# Patient Record
Sex: Female | Born: 1940
Health system: Southern US, Community
[De-identification: ages and names within clinical notes are randomized; demographics above are authoritative.]

## PROBLEM LIST (undated history)

## (undated) DIAGNOSIS — C44309 Unspecified malignant neoplasm of skin of other parts of face: Secondary | ICD-10-CM

## (undated) DIAGNOSIS — R52 Pain, unspecified: Secondary | ICD-10-CM

## (undated) DIAGNOSIS — I499 Cardiac arrhythmia, unspecified: Secondary | ICD-10-CM

## (undated) DIAGNOSIS — H9190 Unspecified hearing loss, unspecified ear: Secondary | ICD-10-CM

## (undated) DIAGNOSIS — M1990 Unspecified osteoarthritis, unspecified site: Secondary | ICD-10-CM

## (undated) DIAGNOSIS — E785 Hyperlipidemia, unspecified: Secondary | ICD-10-CM

## (undated) DIAGNOSIS — I10 Essential (primary) hypertension: Secondary | ICD-10-CM

## (undated) HISTORY — DX: Essential (primary) hypertension: I10

## (undated) HISTORY — DX: Unspecified osteoarthritis, unspecified site: M19.90

## (undated) HISTORY — DX: Hyperlipidemia, unspecified: E78.5

---

## 1970-09-30 HISTORY — PX: BREAST EXCISIONAL BIOPSY: SUR124

## 1997-09-30 HISTORY — PX: HYSTEROSCOPY: SHX211

## 2000-01-23 ENCOUNTER — Encounter: Admission: RE | Admit: 2000-01-23 | Discharge: 2000-01-23 | Payer: Self-pay | Admitting: Obstetrics and Gynecology

## 2000-01-23 ENCOUNTER — Encounter: Payer: Self-pay | Admitting: Obstetrics and Gynecology

## 2001-01-23 ENCOUNTER — Encounter: Admission: RE | Admit: 2001-01-23 | Discharge: 2001-01-23 | Payer: Self-pay | Admitting: Obstetrics and Gynecology

## 2001-01-23 ENCOUNTER — Encounter: Payer: Self-pay | Admitting: Obstetrics and Gynecology

## 2002-01-27 ENCOUNTER — Encounter: Admission: RE | Admit: 2002-01-27 | Discharge: 2002-01-27 | Payer: Self-pay | Admitting: Obstetrics and Gynecology

## 2002-01-27 ENCOUNTER — Encounter: Payer: Self-pay | Admitting: Obstetrics and Gynecology

## 2003-02-09 ENCOUNTER — Encounter: Admission: RE | Admit: 2003-02-09 | Discharge: 2003-02-09 | Payer: Self-pay | Admitting: Obstetrics and Gynecology

## 2003-02-09 ENCOUNTER — Encounter: Payer: Self-pay | Admitting: Obstetrics and Gynecology

## 2004-02-17 ENCOUNTER — Encounter: Admission: RE | Admit: 2004-02-17 | Discharge: 2004-02-17 | Payer: Self-pay | Admitting: Obstetrics and Gynecology

## 2005-02-18 ENCOUNTER — Encounter: Admission: RE | Admit: 2005-02-18 | Discharge: 2005-02-18 | Payer: Self-pay | Admitting: Obstetrics and Gynecology

## 2006-02-19 ENCOUNTER — Encounter: Admission: RE | Admit: 2006-02-19 | Discharge: 2006-02-19 | Payer: Self-pay | Admitting: Obstetrics and Gynecology

## 2007-02-24 ENCOUNTER — Encounter: Admission: RE | Admit: 2007-02-24 | Discharge: 2007-02-24 | Payer: Self-pay | Admitting: Anesthesiology

## 2007-11-02 DIAGNOSIS — E7800 Pure hypercholesterolemia, unspecified: Secondary | ICD-10-CM | POA: Insufficient documentation

## 2007-11-02 DIAGNOSIS — I10 Essential (primary) hypertension: Secondary | ICD-10-CM | POA: Insufficient documentation

## 2008-04-21 ENCOUNTER — Ambulatory Visit: Payer: Self-pay

## 2008-09-13 DIAGNOSIS — N959 Unspecified menopausal and perimenopausal disorder: Secondary | ICD-10-CM | POA: Insufficient documentation

## 2008-09-19 ENCOUNTER — Ambulatory Visit: Payer: Self-pay | Admitting: Family Medicine

## 2008-12-12 DIAGNOSIS — E559 Vitamin D deficiency, unspecified: Secondary | ICD-10-CM | POA: Insufficient documentation

## 2009-04-25 ENCOUNTER — Ambulatory Visit: Payer: Self-pay

## 2010-02-09 DIAGNOSIS — IMO0002 Reserved for concepts with insufficient information to code with codable children: Secondary | ICD-10-CM | POA: Insufficient documentation

## 2010-02-09 DIAGNOSIS — I4891 Unspecified atrial fibrillation: Secondary | ICD-10-CM | POA: Insufficient documentation

## 2010-02-27 ENCOUNTER — Ambulatory Visit: Payer: Self-pay | Admitting: Gastroenterology

## 2010-06-07 ENCOUNTER — Ambulatory Visit: Payer: Self-pay

## 2010-09-25 ENCOUNTER — Ambulatory Visit: Payer: Self-pay | Admitting: Family Medicine

## 2011-07-01 ENCOUNTER — Ambulatory Visit: Payer: Self-pay | Admitting: Family Medicine

## 2011-10-01 HISTORY — PX: CHOLECYSTECTOMY: SHX55

## 2012-02-10 ENCOUNTER — Ambulatory Visit: Payer: Self-pay | Admitting: Family Medicine

## 2012-02-10 LAB — BASIC METABOLIC PANEL
BUN: 28 mg/dL — ABNORMAL HIGH (ref 7–18)
Calcium, Total: 8.2 mg/dL — ABNORMAL LOW (ref 8.5–10.1)
EGFR (Non-African Amer.): 42 — ABNORMAL LOW
Glucose: 107 mg/dL — ABNORMAL HIGH (ref 65–99)
Sodium: 137 mmol/L (ref 136–145)

## 2012-02-10 LAB — CBC WITH DIFFERENTIAL/PLATELET
Basophil %: 1.1 %
HCT: 36.6 % (ref 35.0–47.0)
MCH: 31.9 pg (ref 26.0–34.0)
Monocyte %: 8 %
Neutrophil #: 3 10*3/uL (ref 1.4–6.5)
Platelet: 161 10*3/uL (ref 150–440)
RBC: 3.88 10*6/uL (ref 3.80–5.20)
WBC: 7 10*3/uL (ref 3.6–11.0)

## 2012-02-10 LAB — HEPATIC FUNCTION PANEL A (ARMC)
Bilirubin,Total: 0.5 mg/dL (ref 0.2–1.0)
Total Protein: 7.6 g/dL (ref 6.4–8.2)

## 2012-02-11 LAB — CBC WITH DIFFERENTIAL/PLATELET
Basophil: 1 %
HCT: 34.2 % — ABNORMAL LOW (ref 35.0–47.0)
Lymphocytes: 38 %
Monocytes: 9 %
Segmented Neutrophils: 27 %
Variant Lymphocyte - H1-Rlymph: 22 %

## 2012-02-11 LAB — BASIC METABOLIC PANEL
Calcium, Total: 7.9 mg/dL — ABNORMAL LOW (ref 8.5–10.1)
Creatinine: 1 mg/dL (ref 0.60–1.30)
EGFR (African American): >60
EGFR (Non-African Amer.): 56 — ABNORMAL LOW
Osmolality: 284 (ref 275–301)

## 2012-02-12 ENCOUNTER — Inpatient Hospital Stay: Payer: Self-pay | Admitting: Surgery

## 2012-02-12 LAB — BASIC METABOLIC PANEL
Anion Gap: 5 — ABNORMAL LOW (ref 7–16)
BUN: 11 mg/dL (ref 7–18)
Calcium, Total: 7.2 mg/dL — ABNORMAL LOW (ref 8.5–10.1)
Chloride: 112 mmol/L — ABNORMAL HIGH (ref 98–107)
Co2: 26 mmol/L (ref 21–32)
EGFR (African American): >60
Osmolality: 284 (ref 275–301)
Sodium: 143 mmol/L (ref 136–145)

## 2012-02-12 LAB — CBC WITH DIFFERENTIAL/PLATELET
Basophil %: 0.7 %
Eosinophil %: 1.8 %
HCT: 31.9 % — ABNORMAL LOW (ref 35.0–47.0)
MCV: 95 fL (ref 80–100)
Monocyte %: 7 %
Monocytes: 7 %
Neutrophil #: 1.6 10*3/uL (ref 1.4–6.5)
Neutrophil %: 25 %
Platelet: 157 10*3/uL (ref 150–440)
Segmented Neutrophils: 28 %
Variant Lymphocyte - H1-Rlymph: 26 %

## 2012-02-12 LAB — HEPATIC FUNCTION PANEL A (ARMC)
Albumin: 2 g/dL — ABNORMAL LOW (ref 3.4–5.0)
Bilirubin, Direct: <0.1 mg/dL (ref 0.00–0.20)
SGOT(AST): 49 U/L — ABNORMAL HIGH (ref 15–37)
SGPT (ALT): 22 U/L

## 2012-02-19 LAB — PATHOLOGY REPORT

## 2012-07-06 ENCOUNTER — Ambulatory Visit: Payer: Self-pay | Admitting: Family Medicine

## 2012-09-29 ENCOUNTER — Ambulatory Visit: Payer: Self-pay | Admitting: Family Medicine

## 2013-07-12 ENCOUNTER — Ambulatory Visit: Payer: Self-pay | Admitting: Family Medicine

## 2014-07-18 ENCOUNTER — Ambulatory Visit: Payer: Self-pay | Admitting: Family Medicine

## 2014-07-29 ENCOUNTER — Ambulatory Visit: Payer: Self-pay | Admitting: Family Medicine

## 2014-08-02 ENCOUNTER — Encounter: Payer: Self-pay | Admitting: General Surgery

## 2014-08-15 ENCOUNTER — Encounter: Payer: Self-pay | Admitting: General Surgery

## 2014-08-15 ENCOUNTER — Ambulatory Visit (INDEPENDENT_AMBULATORY_CARE_PROVIDER_SITE_OTHER): Payer: Medicare Other | Admitting: General Surgery

## 2014-08-15 VITALS — BP 140/78 | HR 78 | Resp 14 | Ht 63.0 in | Wt 116.0 lb

## 2014-08-15 DIAGNOSIS — R928 Other abnormal and inconclusive findings on diagnostic imaging of breast: Secondary | ICD-10-CM

## 2014-08-15 DIAGNOSIS — R921 Mammographic calcification found on diagnostic imaging of breast: Secondary | ICD-10-CM

## 2014-08-15 NOTE — Patient Instructions (Addendum)
Stereotactic Breast Biopsy A stereotactic breast biopsy is a procedure in which mammography is used in the collection of a sample of breast tissue. Mammography is a type of X-ray exam of the breasts that produces an image called a mammogram. The mammogram allows your health care provider to precisely locate the area of the breast from which a tissue sample will be taken. The tissue is then examined under a microscope to see if cancerous cells are present. A breast biopsy is done when:   A lump, abnormality, or mass is seen in the breast on a breast X-ray (mammogram).   Small calcium deposits (calcifications) are seen in the breast.   The shape or appearance of the breasts changes.   The shape or appearance of the nipples changes. You may have unusual or bloody discharge coming from the nipples, or you may have crusting, retraction, or dimpling of the nipples. A breast biopsy can indicate if you need surgery or other treatment.  LET YOUR HEALTH CARE PROVIDER KNOW ABOUT:  Any allergies you have.  All medicines you are taking, including vitamins, herbs, eye drops, creams, and over-the-counter medicines.  Previous problems you or members of your family have had with the use of anesthetics.  Any blood disorders you have.  Previous surgeries you have had.  Medical conditions you have. RISKS AND COMPLICATIONS Generally, stereotactic breast biopsy is a safe procedure. However, as with any procedure, complications can occur. Possible complications include:  Infection at the needle-insertion site.   Bleeding or bruising after surgery.  The breast may become altered or deformed as a result of the procedure.  The needle may go through the chest wall into the lung area.  BEFORE THE PROCEDURE  Wear a supportive bra to the procedure.  You will be asked to remove jewelry, dentures, eyeglasses, metal objects, or clothing that might interfere with the X-ray images. You may want to leave  some of these objects at home.  Arrange for someone to drive you home after the procedure if desired. PROCEDURE  A stereotactic breast biopsy is done while you are awake. During the procedure, relax as much as possible. Let your health care provider know if you are uncomfortable, anxious, or in pain. Usually, the only discomfort felt during the procedure is caused by staying in one position for the length of the procedure. This discomfort can be reduced by carefully placed cushions. Most of the time the biopsy is done using a table with openings on it. You will be asked to lie facedown on the table and place your breasts through the openings. Your breast is compressed between metal plates to get good X-ray images. Your skin will be cleaned, and a numbing medicine (local anesthetic) will be injected. A small cut (incision) will be made in your breast. The tip of the biopsy needle will be directed through the incision. Several small pieces of suspicious tissue will be taken. Then, a final set of X-ray images will be obtained. If they show that the suspicious tissue has been mostly or completely removed, a small clip will be left at the biopsy site. This is done so that the biopsy site can be easily located if the results of the biopsy show that the tissue is cancerous.  After the procedure, the incision will be stitched (sutured) or taped and covered with a bandage (dressing). Your health care provider may apply a pressure dressing and an ice pack to prevent bleeding and swelling in the breast.  A stereotactic   breast biopsy can take 30 minutes or more. AFTER THE PROCEDURE  If you are doing well and have no problems, you will be allowed to go home.  Document Released: 06/15/2003 Document Revised: 09/21/2013 Document Reviewed: 04/15/2013 [facility] Patient Information 2015 [place], [place]. This information is not intended to replace advice given to you by your health care provider. Make sure you discuss any  questions you have with your health care provider.  Patient has been scheduled for a right breast stereotactic biopsy at [facility] for 08-22-14 at 4 pm. She will check-in at the [facility] at 3:30 pm. This patient is aware of date, time, and instructions. Patient verbalizes understanding.

## 2014-08-15 NOTE — Progress Notes (Signed)
Patient ID: Morgan Ponce, female   DOB: November 25, 1940, 73 y.o.   MRN: [id number]  Chief Complaint  Patient presents with  . Follow-up    mammogram    HPI Morgan Ponce is a 73 y.o. female. Here for follow up of a mammogram done on 07/18/14 with added views on 07/29/14. Calcifications were found in her right breast. This was from her yearly mammogram. She reports no breast problems previous to this. She does monthly breast checks and annual mammograms.  HPI  Past Medical History  Diagnosis Date  . Hypertension   . Hyperlipidemia   . Arthritis     Past Surgical History  Procedure Laterality Date  . Breast biopsy Right 1972  . Hysteroscopy  1999  . Cholecystectomy  2013    No family history on file.  Social History History  Substance Use Topics  . Smoking status: Never Smoker   . Smokeless tobacco: Never Used  . Alcohol Use: No    Allergies  Allergen Reactions  . Cardizem [Diltiazem] Rash    Current Outpatient Prescriptions  Medication Sig Dispense Refill  . alendronate (FOSAMAX) 70 MG tablet Take 70 mg by mouth once a week.     Marland Kitchen aspirin 81 MG tablet Take 81 mg by mouth daily.    . hydrochlorothiazide (HYDRODIURIL) 25 MG tablet Take 25 mg by mouth daily.     Marland Kitchen lovastatin (MEVACOR) 10 MG tablet Take 10 mg by mouth at bedtime.     . Vitamin D, Cholecalciferol, 1000 UNITS TABS Take 1 tablet by mouth daily.     No current facility-administered medications for this visit.    Review of Systems Review of Systems  Constitutional: Negative.   HENT: Negative.   Respiratory: Negative.     Blood pressure 140/78, pulse 78, resp. rate 14, height 5\' 3"  (1.6 m), weight 116 lb (52.617 kg).  Physical Exam Physical Exam  Constitutional: She is oriented to person, place, and time. She appears well-developed and well-nourished.  Neck: Neck supple.  Cardiovascular: Normal rate, regular rhythm and normal heart sounds.   Pulmonary/Chest: Effort normal and breath sounds normal.  Right breast exhibits no inverted nipple, no mass, no nipple discharge, no skin change and no tenderness. Left breast exhibits no inverted nipple, no mass, no nipple discharge, no skin change and no tenderness. Breasts are asymmetrical (right breast larger than left).  Lymphadenopathy:    She has no cervical adenopathy.    She has no axillary adenopathy.  Neurological: She is alert and oriented to person, place, and time.    Data Reviewed PCP notes of 07/19/2014.  Screening mammograms of 07/18/2014 were reviewed and compared to studies dating back to 2013. A small area of microcalcifications are noted in the right breast more prominent than in 2014 and significantly more prominent than 2013 my review.  Focal spot compression views dated 07/29/2014 showed similar calcifications dating back through 2007. BI-RADS-3. (Sees biopsy was offered to the patient by the radiologist) sees.  Assessment    Right breast microcalcifications, low risk for malignancy.     Plan    My index of suspicion for these calcifications being malignant is exceptionally well. The patient is exceptionally anxious. The fact the biopsy was discussed with the radiologist has raised her level of concern beyond what I can counteract.the risks associated with the stereotactic procedure have been reviewed.  Patient has been scheduled for a right breast stereotactic biopsy at [facility] for 08-22-14 at 4 pm. She will check-in at  the [facility] at 3:30 pm. This patient is aware of date, time, and instructions. Patient verbalizes understanding.     PCP/REF: Earnest Bailey, Caryl-Lyn M 08/15/2014, 1:07 PM

## 2014-08-16 DIAGNOSIS — R921 Mammographic calcification found on diagnostic imaging of breast: Secondary | ICD-10-CM | POA: Insufficient documentation

## 2014-08-22 ENCOUNTER — Ambulatory Visit: Payer: Self-pay | Admitting: General Surgery

## 2014-08-22 DIAGNOSIS — R920 Mammographic microcalcification found on diagnostic imaging of breast: Secondary | ICD-10-CM

## 2014-08-22 HISTORY — PX: BREAST BIOPSY: SHX20

## 2014-08-23 ENCOUNTER — Ambulatory Visit: Payer: Self-pay | Admitting: General Surgery

## 2014-08-24 ENCOUNTER — Encounter: Payer: Self-pay | Admitting: General Surgery

## 2014-08-29 ENCOUNTER — Encounter: Payer: Self-pay | Admitting: General Surgery

## 2014-08-29 ENCOUNTER — Ambulatory Visit (INDEPENDENT_AMBULATORY_CARE_PROVIDER_SITE_OTHER): Payer: Self-pay | Admitting: *Deleted

## 2014-08-29 DIAGNOSIS — R921 Mammographic calcification found on diagnostic imaging of breast: Secondary | ICD-10-CM

## 2014-08-29 DIAGNOSIS — R928 Other abnormal and inconclusive findings on diagnostic imaging of breast: Secondary | ICD-10-CM

## 2014-08-29 NOTE — Progress Notes (Signed)
Patient here today for follow up post right breast stereo.Steristrip in place and aware it may come off in one week.  Minimal bruising noted.  The patient is aware that a heating pad may be used for comfort as needed.. Follow up as scheduled.

## 2015-01-16 DIAGNOSIS — M810 Age-related osteoporosis without current pathological fracture: Secondary | ICD-10-CM | POA: Diagnosis not present

## 2015-01-16 DIAGNOSIS — E785 Hyperlipidemia, unspecified: Secondary | ICD-10-CM | POA: Diagnosis not present

## 2015-01-16 DIAGNOSIS — I10 Essential (primary) hypertension: Secondary | ICD-10-CM | POA: Diagnosis not present

## 2015-01-16 DIAGNOSIS — M719 Bursopathy, unspecified: Secondary | ICD-10-CM | POA: Diagnosis not present

## 2015-01-16 DIAGNOSIS — Z23 Encounter for immunization: Secondary | ICD-10-CM | POA: Diagnosis not present

## 2015-01-16 LAB — HEPATIC FUNCTION PANEL
ALK PHOS: 51 U/L (ref 25–125)
ALT: 12 U/L (ref 7–35)
AST: 28 U/L (ref 13–35)
BILIRUBIN, TOTAL: 0.5 mg/dL

## 2015-01-16 LAB — CBC AND DIFFERENTIAL
HEMATOCRIT: 37 % (ref 36–46)
HEMOGLOBIN: 12.2 g/dL (ref 12.0–16.0)
Neutrophils Absolute: 4 /uL
Platelets: 225 10*3/uL (ref 150–399)
WBC: 6.1 10^3/mL

## 2015-01-16 LAB — LIPID PANEL
Cholesterol: 177 mg/dL (ref 0–200)
HDL: 68 mg/dL (ref 35–70)
LDL CALC: 92 mg/dL
LDl/HDL Ratio: 1.4
TRIGLYCERIDES: 87 mg/dL (ref 40–160)

## 2015-01-16 LAB — TSH: TSH: 3.34 u[IU]/mL (ref 0.41–5.90)

## 2015-01-16 LAB — BASIC METABOLIC PANEL
BUN: 18 mg/dL (ref 4–21)
Creatinine: 0.8 mg/dL (ref 0.5–1.1)
GLUCOSE: 91 mg/dL

## 2015-01-22 NOTE — Consult Note (Signed)
Chief Complaint:   Subjective/Chief Complaint Went to see patient: not in room, currently in [place]: Theodore Demark (NP)  (Signed 16-May-13 12:36)  Authored: Chief Complaint   Last Updated: 16-May-13 12:36 by Theodore Demark (NP)

## 2015-01-22 NOTE — Op Note (Signed)
PATIENT NAME:  Morgan Ponce, Morgan Ponce MR#:  [id number] DATE OF BIRTH:  1941/08/09  DATE OF PROCEDURE:  02/13/2012  PREOPERATIVE DIAGNOSIS: Chronic cholecystitis and cholelithiasis.   POSTOPERATIVE DIAGNOSIS: Chronic cholecystitis and cholelithiasis.   OPERATION: Laparoscopic cholecystectomy.   SURGEON: Rodena Goldmann, MD  ANESTHESIA: General.   OPERATIVE PROCEDURE: With the patient in the supine position after induction of appropriate general anesthesia, the patient's abdomen was prepped with ChloraPrep and draped with sterile towels. The patient was placed in the head down, feet up position. A small infraumbilical incision was made in the standard fashion and carried down bluntly through the subcutaneous tissue. The Veress needle was used to cannulate the peritoneal cavity. CO2 was insufflated to appropriate pressure measurements. When approximately 2.5 liters of CO2 were instilled, the Veress needle was withdrawn and an 11 mm [employer] port was inserted into the peritoneal cavity. Intraperitoneal position was confirmed and CO2 was reinsufflated. The patient was placed in the head up, feet down position and rotated slightly to the left side. A subxiphoid transverse incision was made and an 11 mm port was inserted under direct vision. Two lateral ports 5 mm in size were inserted under direct vision. There were multiple adhesions to the gallbladder which appeared chronically inflamed with discoloration and scarring about the gallbladder. The gallbladder was retracted superiorly and laterally exposing the hepatoduodenal ligament. The cystic artery and cystic duct were identified. The cystic duct was clipped on the gallbladder side and opened. An on table cholangiogram was attempted, but the cystic duct was so small I could not cannulate it. Cholangiography was abandoned. The cystic duct was doubly clipped on the common duct side and divided. The cystic artery was doubly clipped and divided. The gallbladder  was then dissected free from its bed and delivered using hook and cautery apparatus. Once the gallbladder was free, the camera remained in the umbilical port and the gallbladder was brought through the epigastric port using the 11 mm grasping instrument. That port was removed and the defect closed with a suture passer and figure-of-eight sutures of 0 Vicryl. The abdomen was then irrigated and suctioned. It was desufflated and all ports were withdrawn without difficulty. Skin incisions were closed with 5-0 nylon. The area was infiltrated with 0.25% Marcaine for postoperative pain control. Sterile dressings were applied. The patient was returned to the recovery room having tolerated the procedure well. Sponge, instrument, and needle counts were correct x2 in the operating room.  ____________________________ Rodena Goldmann III, MD rle:slb D: 02/13/2012 12:15:55 ET T: 02/13/2012 12:30:26 ET JOB#: [id number]  cc: Rodena Goldmann III, MD, <Dictator> Richard L. Rosanna Randy, MD Rodena Goldmann MD ELECTRONICALLY SIGNED 02/13/2012 14:10

## 2015-01-22 NOTE — H&P (Signed)
PATIENT NAME:  Morgan Ponce MR#:  [id number] DATE OF BIRTH:  11-20-1940  DATE OF ADMISSION:  02/10/2012  PRIMARY CARE PHYSICIAN: Richard L. Rosanna Randy, MD   ADMITTING PHYSICIAN: Rodena Goldmann, III, MD    CHIEF COMPLAINT: Nausea.   BRIEF HISTORY: Morgan Ponce is a 74 year old woman referred urgently by Dr. Rosanna Randy for evaluation of abdominal pain and persistent nausea and vomiting. Her symptoms began approximately two weeks ago with significant abdominal discomfort, mild nausea and intermittent vomiting. She felt poorly, had upper respiratory symptoms with some sinus occlusion, and spent most of several days following the onset of symptoms in bed or resting. She was unable to keep anything on her stomach regularly but was able to drink, void normally and have normal bowel movements. She began to feel better until last Wednesday when she had a substantial meal and developed increasing nausea, abdominal pain, with profound vomiting. She has been vomiting since last week without any improvement in her symptoms. She saw Dr. Rosanna Randy last week. Her laboratory values were largely unremarkable. He treated her symptomatically, but her symptoms have not improved. She returned to see him today. Ultrasound was performed which demonstrated some cholelithiasis without any evidence of acute gallbladder wall thickening, pericholecystic fluid or ductal obstruction. Because of her persistent symptoms, mild tachycardia and mild hypotension, he recommended Surgical evaluation.   She denies any previous similar problems. She denies any history of hepatitis, yellow jaundice, pancreatitis, peptic ulcer disease, diverticulitis, or previous diagnosis of gallbladder disease. She has had previous colonoscopy. She has had no abdominal surgery. Her only previous surgery was a breast biopsy. She has no cardiac disease or diabetes. She is hypertensive and hyperlipidemic. She is regularly followed by Dr. Stacey Drain.   CURRENT  MEDICATIONS:  1. Lovastatin 10 mg p.o. daily.  2. Hydrochlorothiazide 25 mg p.o. daily.  3. Fosamax 70 mg once a week.  4. Aspirin 81 mg p.o. daily.  5. Vitamin D.   ALLERGIES: She is allergic to sulfa drugs which cause a rash.   SOCIAL HISTORY: She does not smoke or drink alcohol. She is retired and does not work outside her home. She lives with her husband, who accompanies her in the Emergency Room.   REVIEW OF SYSTEMS: Review of systems is otherwise unremarkable.   FAMILY HISTORY: Noncontributory.   PHYSICAL EXAMINATION:  GENERAL: Heart rate is 110 and regular. Blood pressure is 110/65. She is afebrile. She does appear pale and washed out.   HEENT: Exam is unremarkable. She is anicteric. She has no facial deformity and normal pupils. She has no cervical adenopathy. Trachea is midline. She has no neck tenderness.   CHEST: Clear with no adventitious sounds. She appears to have normal pulmonary excursion.   CARDIAC: Exam reveals no murmurs or gallops to my ear. She seems to be in normal sinus rhythm.   ABDOMEN: Her abdomen is generally soft with some moderate lower quadrant generalized tenderness but no point tenderness, no rebound, no guarding. She is not complaining of any abdominal pain at the present time. She has active bowel sounds. No hernias are noted.   EXTREMITIES: Full range of motion, no deformities.   PSYCHIATRIC: Normal orientation, normal affect.   IMPRESSION/PLAN: I have reviewed the medical records provided to me. I do not see any evidence of any significant laboratory abnormality, but her labs have not been rechecked. She did not clinically have evidence of acute cholecystitis. She may be mildly dehydrated. In view of her symptoms, particularly her profound nausea  and vomiting, we will admit her to the hospital, rehydrate her, recheck her labs. We will hold antibiotic therapy until we see if she develops a fever or if her white blood cell count is elevated.    We  discussed this plan with her in detail, and her husband was present. They are in agreement.  ____________________________ Micheline Maze, MD rle:cbb D: 02/10/2012 18:11:37 ET T: 02/10/2012 18:27:39 ET JOB#: [id number]  cc: Rodena Goldmann III, MD, <Dictator> Richard L. Rosanna Randy, MD Rodena Goldmann MD ELECTRONICALLY SIGNED 02/12/2012 16:40

## 2015-01-22 NOTE — Consult Note (Signed)
Brief Consult Note: Diagnosis: NV.   Patient was seen by consultant.   Consult note dictated.   Comments: Appreciate consult for 74 y/o caucasian woman admitted for NV with cholelithiasis, with history of htn, HL, osteoporosis, for evaluation of possible biliary tract disease. Reports a one week history of nausea and vomiting, triggered by food intake. Denies abdominal pain, black tarry/bloody stools, acid reflux, heartburn, indigestion, problems swallowing. Does say she has had a couple of loose stools since symptoms began, but nothing problematic. States her vomiting has improved since admission, but continues with some nausea. Does have some RUQ tenderness to palpation on exam.  Do note presence of gallstones and  elevated lipase. There is no ductal dilation to the CBD or pancreatic ducts, no gallbladder wall thickness, or fluid around the gallbladder, no pseudocyst or abnormal appearing pancreas on CT/US. Minimal elevation of AST, otherwise liver panel is normal. Did have colonoscopy 2011 that was normal. No history of EGD.   Impression and plan: Biliary pancreatitis: no evidence of choledocholelithiasis on CT and [place]. Will order Hida. Further recommendations to follow.  Electronic Signatures: Stephens November H (NP)  (Signed 14-May-13 15:41)  Authored: Brief Consult Note   Last Updated: 14-May-13 15:41 by Theodore Demark (NP)

## 2015-01-22 NOTE — Discharge Summary (Signed)
PATIENT NAME:  Morgan Ponce, Morgan Ponce MR#:  [id number] DATE OF BIRTH:  [birth year]  DATE OF ADMISSION:  02/12/2012 DATE OF DISCHARGE:  02/15/2012  BRIEF HISTORY: Atiyana Welte is a 74 year old woman seen in the Emergency Room on referral from her primary care physician with signs and symptoms consistent with biliary tract disease. She had had symptoms for approximately two weeks. A gallbladder ultrasound which demonstrated some cholelithiasis without evidence of acute gallbladder wall thickening, pericholecystic fluid or ductal obstruction.   HOSPITAL COURSE: The patient was admitted and noted to have slightly elevated lipase which returned to normal on the first hospital day. The patient's symptoms were concerning for biliary tract disease and we asked the GI service to evaluate her. Dr. Loistine Simas of the [facility] gastroenterology department assisted in her management. He recommended a HIDA scan which was performed on the early morning of 02/12/2012. Lipase was back to normal at that point. A HIDA scan demonstrated no evidence of any significant biliary tract disease with no evidence of any acute cholecystitis. With her elevated lipase we felt that she likely had an episode of biliary pancreatitis. She was taken to surgery after appropriate preoperative preparation on 02/13/2012. She underwent a laparoscopic cholecystectomy. The procedure was uncomplicated. She had evidence of subacute and chronic cholecystitis. Her symptoms improved over the next 48 hours. She was discharged home on the 18th to be followed in the office in 7 to 10 days' time.   DISCHARGE INSTRUCTIONS: Bathing, activity and driving instructions were given to the patient.   DISCHARGE MEDICATIONS:  1. Norco 5/325, 1 to 2 tablets p.o. q.6 hours p.r.n.  2. Lovastatin 10 mg p.o. daily.  3. Hydrochlorothiazide 25 mg p.o. daily.   FINAL DISCHARGE DIAGNOSES: Biliary pancreatitis, chronic cholecystitis and cholelithiasis.     PROCEDURE:  Laparoscopic cholecystectomy.   ____________________________ Rodena Goldmann III, MD rle:rbg D: 02/21/2012 21:15:24 ET T: 02/24/2012 12:39:27 ET JOB#: [id number]  cc: Micheline Maze, MD, <Dictator> Lollie Sails, MD Richard L. Rosanna Randy, MD Rodena Goldmann MD ELECTRONICALLY SIGNED 02/24/2012 19:52

## 2015-01-22 NOTE — Consult Note (Signed)
Chief Complaint:   Subjective/Chief Complaint Patient seen and examined, please see full GI consult. Patient admittted with n/v and epigastric/right abdominal pain of an intermittant nature over the past 2 weeks.  Evidence of cholelithiasis but without choledocholithiasis.  Pain currently  minimal.  Tolerating some clear liquids.  Recommend hepatobiliary scan to ro cystic duct obstruction.  Patient with family history of gallbladder disease in primary relatives.   Discussed with Dr Pat Patrick.  Continue ppi.   VITAL SIGNS/ANCILLARY NOTES: **Vital Signs.:   14-May-13 09:48   Vital Signs Type Q 4hr   Temperature Temperature (F) 97.8   Celsius 36.5   Temperature Source oral   Pulse Pulse 80   Pulse source per Dinamap   Respirations Respirations 18   Systolic BP Systolic BP 789   Diastolic BP (mmHg) Diastolic BP (mmHg) 72   Mean BP 88   BP Source Dinamap   Pulse Ox % Pulse Ox % 94   Pulse Ox Activity Level  At rest   Oxygen Delivery Room Air/ 21 %   Electronic Signatures: Loistine Simas (MD)  (Signed 14-May-13 16:54)  Authored: Chief Complaint, VITAL SIGNS/ANCILLARY NOTES   Last Updated: 14-May-13 16:54 by Loistine Simas (MD)

## 2015-01-22 NOTE — Consult Note (Signed)
Chief Complaint:   Subjective/Chief Complaint soem epigastric and ruq discomfort, c/w post surgical.  passing small amount of flatus.   VITAL SIGNS/ANCILLARY NOTES: **Vital Signs.:   17-May-13 14:20   Vital Signs Type Routine   Temperature Temperature (F) 97.8   Celsius 36.5   Temperature Source oral   Pulse Pulse 77   Pulse source per Dinamap   Respirations Respirations 17   Systolic BP Systolic BP 616   Diastolic BP (mmHg) Diastolic BP (mmHg) 74   Mean BP 99   BP Source Dinamap   Pulse Ox % Pulse Ox % 94   Pulse Ox Activity Level  At rest   Oxygen Delivery Room Air/ 21 %   Brief Assessment:   Cardiac Regular    Respiratory clear BS    Gastrointestinal details normal mild distension, bs positive but distant/few.  appropriately tender.   Assessment/Plan:  Assessment/Plan:   Assessment 1) biliary pancreatitis, s/p [facility].    Plan 1) would continue ppi through recovery from surgery.  will sign off, reconsult if needed.   Electronic Signatures: Loistine Simas (MD)  (Signed 17-May-13 16:10)  Authored: Chief Complaint, VITAL SIGNS/ANCILLARY NOTES, Brief Assessment, Assessment/Plan   Last Updated: 17-May-13 16:10 by Loistine Simas (MD)

## 2015-01-22 NOTE — Consult Note (Signed)
PATIENT NAME:  Morgan Ponce, Morgan Ponce MR#:  [id number] DATE OF BIRTH:  09/26/1941  DATE OF CONSULTATION:  02/11/2012  REFERRING PHYSICIAN:   CONSULTING PHYSICIAN:  Theodore Demark, NP  PRIMARY CARE PHYSICIAN: Miguel Aschoff, MD  HISTORY OF PRESENT ILLNESS: Morgan Ponce is a 74 year old woman admitted for persistent nausea and vomiting and Gastroenterology has been consulted at the request of Dr. Pat Patrick to evaluate for possible biliary tract disease. She does have a history of hypertension, hyperlipidemia, and osteoporosis. The patient reports a one-week history of nausea and vomiting triggered by food intake. She denies abdominal pain, black tarry or bloody stools, acid reflux, heartburn, indigestion, and problems swallowing. She states she has had a couple of loose stools since her symptoms began, but nothing problematic. She states her vomiting has improved since admission with the use of antiemetics, but continues with some nausea. I do note presence of gallstones on both CT and ultrasound with elevated lipase. There is no ductal dilation to the common bile duct or pancreatic ducts. No gallbladder wall thickness or fluid around the gallbladder. There is no pseudocyst or abnormal appearing pancreas on the CT or ultrasound. There is minimal elevation of the AST. Otherwise, her liver panel is normal. She did have a colonoscopy in 2011 that revealed no polyps. There is no history of EGD.   CURRENT MEDICATIONS:  1. Lovastatin 10 mg p.o. daily.  2. Hydrochlorothiazide 25 mg p.o. daily.  3. Fosamax 70 mg p.o. every week.  4. ASA 81 mg p.o. daily.  5. Vitamin D.   DRUG ALLERGIES: Sulfa and Cardizem.   PAST MEDICAL HISTORY:  1. Hypertension.  2. Hyperlipidemia.  3. Osteoporosis.  4. Vitamin D deficiency.  5. Benign breast biopsy in 1972. 6. Hysteroscopy in 1999.   SOCIAL HISTORY: No tobacco, alcohol, or illicits. Retired and lives with husband.   FAMILY HISTORY: Pertinent for siblings with colon  polyps, cousin with stomach cancer, coronary artery disease, and sister and brother with gallbladder disease. Another brother with peptic ulcer disease. No history of colorectal cancer or liver disease.  REVIEW OF SYSTEMS: A 10 point review of systems is otherwise unremarkable.   LABS/STUDIES: Most recent lab work: Glucose 110, BUN 25, creatinine 1, sodium 140, potassium 3.7, chloride 104, GFR 56, calcium 7.9, lipase 405, total serum protein 7.6, albumin 2.9, total bilirubin 0.5, direct bilirubin less than 0.1, ALT 59, AST 55, and ALT 30. WBC 6.1, hemoglobin 11.6, hematocrit 34.2, and platelets 167. Her red cells are normocytic with normal RDW.   Ultrasound demonstrated cholelithiasis. No gallbladder wall thickening. The common bile duct measures 3.9 mm. There is no fluid around the gallbladder.   Two-view of abdomen was unremarkable.  CT without contrast demonstrates gallbladder with multiple stones. Normal-appearing pancreas. No ductal dilation or pseudocyst formation. Normal-looking spleen. Normal-looking liver. No bowel issues.   PHYSICAL EXAMINATION:   VITAL SIGNS: Most recent vital signs: Temperature 97.8, pulse 80, respiratory rate 18, blood pressure 121/72, and oxygen saturation 94%.   GENERAL: Well-appearing woman in no acute distress.   PSYCH: Pleasant, logical thought, cooperative.   HEENT: Normocephalic, atraumatic. No icterus to the sclerae. No redness, drainage, or inflammation to the eyes or the nares. Mouth with pink moist mucous membranes.   NECK: No JVD, lymphadenopathy, or thyromegaly.   RESPIRATORY: Respirations eupneic. Lungs are clear to auscultation bilaterally.   CARDIAC: S1 and S2, regular rate and rhythm. No MRG. Peripheral pulses 2+ bilaterally. No appreciable edema.   ABDOMEN: Nondistended. Bowel sounds x4. Soft.  Right upper quadrant tenderness. No peritoneal signs, rebound tenderness, guarding, hepatosplenomegaly, hernias, or bruits.   RECTAL: Deferred.    GENITOURINARY: Deferred.   EXTREMITIES: Strength 5/5. Good range of motion. No clubbing, cyanosis, or edema.   NEUROLOGIC: Alert and oriented x3. Cranial nerves II through XII grossly intact. Speech clear. No facial droop.   IMPRESSION AND PLAN: Biliary pancreatitis. No evidence of choledocholithiasis on CT and [place]. We will order a HIDA scan. Further recommendations to follow.   These services were provided by [facility] in collaboration with Dr. Loistine Simas with whom I have discussed this patient in full.  ____________________________ Theodore Demark, NP chl:slb D: 02/11/2012 15:48:26 ET T: 02/11/2012 16:11:53 ET JOB#: [id number]  cc: Theodore Demark, NP, <Dictator> [place] SIGNED 02/12/2012 8:51

## 2015-01-22 NOTE — Consult Note (Signed)
Chief Complaint:   Subjective/Chief Complaint recurrent nausea overnight, mild ruq discomfort. no emesis, some loose stools overnight.   VITAL SIGNS/ANCILLARY NOTES: **Vital Signs.:   15-May-13 09:37   Vital Signs Type Q 4hr   Temperature Temperature (F) 98.4   Celsius 36.8   Temperature Source oral   Pulse Pulse 87   Pulse source per Dinamap   Respirations Respirations 20   Systolic BP Systolic BP 376   Diastolic BP (mmHg) Diastolic BP (mmHg) 61   Mean BP 81   BP Source Dinamap   Pulse Ox % Pulse Ox % 96   Pulse Ox Activity Level  At rest   Oxygen Delivery Room Air/ 21 %  *Intake and Output.:   15-May-13 06:34   Stool  small loose stool    10:20   Stool  small loose   Brief Assessment:   Cardiac Regular    Respiratory clear BS    Gastrointestinal details normal Soft  Nondistended  No masses palpable  Bowel sounds normal  No rebound tenderness  tender to palpation in the medial ruq.   Routine Hem:  15-May-13 05:13    WBC (CBC) 6.3   RBC (CBC) 3.35   Hemoglobin (CBC) 10.6   Hematocrit (CBC) 31.9   Platelet Count (CBC) 157   MCV 95   MCH 31.6   MCHC 33.2   RDW 14.0   Neutrophil % 25.0   Lymphocyte % 65.5   Monocyte % 7.0   Eosinophil % 1.8   Basophil % 0.7   Neutrophil # 1.6   Lymphocyte # 4.1   Monocyte # 0.4   Eosinophil # 0.1   Basophil # 0.0  Routine Chem:  15-May-13 05:13    Glucose, Serum 95   BUN 11   Creatinine (comp) 0.87   Sodium, Serum 143   Potassium, Serum 4.8   Chloride, Serum 112   CO2, Serum 26   Calcium (Total), Serum 7.2   Anion Gap 5   Osmolality (calc) 284   eGFR (African American) >60   eGFR (Non-African American) >60  Hepatic:  15-May-13 05:13    Bilirubin, Total 0.3   Bilirubin, Direct < 0.1   Alkaline Phosphatase 45   SGPT (ALT) 22   SGOT (AST) 49   Total Protein, Serum 5.8   Albumin, Serum 2.0  Routine Chem:  15-May-13 05:13    Lipase 163  Routine Hem:  15-May-13 05:13    Segmented Neutrophils 28   Lymphocytes  38   Variant Lymphocytes 26   Monocytes 7   Eosinophil 1   Radiology Results: Nuclear Med:    15-May-13 09:27, Hepatobiliary Image - Nuc Med   Hepatobiliary Image - Nuc Med    REASON FOR EXAM:    biliary pancreatitis/evaluate ductal patency  COMMENTS:       PROCEDURE: NM  - NM HEPATOBILIARY IMAGE  - Feb 12 2012  9:27AM     RESULT: Following intravenous administration of 8.508 mCi technetium 73m  Choletec, there is noted prompt visualization of tracer activity in the   liver at 3 minutes. At 10 minutes, tracer activity is visualized in the   common duct and gallbladder. At 60 minutes, tracer activity is visualized   in the small bowel.    IMPRESSION:     Normal hepatobiliary scan. No findings indicative of cystic duct   obstruction are identified.  Thank you for the opportunity to contribute to the care of your patient.  Verified By: Dionne Ano WALL, M.D., MD   Assessment/Plan:  Assessment/Plan:   Assessment 1) biliary pancreatitis-likely passed a small gallstone. symptoms improved, lipase normalized, still some nausea and ruq discomfort. hepatobiliary shows patent cystic duct and no cbd obstruction.    Plan 1) continue current.  agree with [facility] when clinically feasible.  Discussed with Dr Pat Patrick.   Electronic Signatures: Loistine Simas (MD)  (Signed 15-May-13 12:52)  Authored: Chief Complaint, VITAL SIGNS/ANCILLARY NOTES, Brief Assessment, Lab Results, Radiology Results, Assessment/Plan   Last Updated: 15-May-13 12:52 by Loistine Simas (MD)

## 2015-01-23 DIAGNOSIS — H2513 Age-related nuclear cataract, bilateral: Secondary | ICD-10-CM | POA: Diagnosis not present

## 2015-01-23 LAB — SURGICAL PATHOLOGY

## 2015-01-26 ENCOUNTER — Ambulatory Visit
Admit: 2015-01-26 | Disposition: A | Discharge status: Home / Self Care | Payer: Self-pay | Attending: Family Medicine | Admitting: Family Medicine

## 2015-01-26 DIAGNOSIS — Z780 Asymptomatic menopausal state: Secondary | ICD-10-CM | POA: Diagnosis not present

## 2015-01-26 DIAGNOSIS — M8580 Other specified disorders of bone density and structure, unspecified site: Secondary | ICD-10-CM | POA: Diagnosis not present

## 2015-01-26 DIAGNOSIS — M8589 Other specified disorders of bone density and structure, multiple sites: Secondary | ICD-10-CM | POA: Diagnosis not present

## 2015-02-03 ENCOUNTER — Encounter: Payer: Self-pay | Admitting: *Deleted

## 2015-02-06 ENCOUNTER — Ambulatory Visit
Admission: RE | Admit: 2015-02-06 | Discharge: 2015-02-06 | Disposition: A | Discharge status: Home / Self Care | Payer: Medicare Other | Source: Ambulatory Visit | Attending: Gastroenterology | Admitting: Gastroenterology

## 2015-02-06 ENCOUNTER — Ambulatory Visit: Payer: Medicare Other | Admitting: Anesthesiology

## 2015-02-06 ENCOUNTER — Encounter
Admission: RE | Disposition: A | Discharge status: Home / Self Care | Payer: Self-pay | Source: Ambulatory Visit | Attending: Gastroenterology

## 2015-02-06 ENCOUNTER — Encounter: Payer: Self-pay | Admitting: *Deleted

## 2015-02-06 DIAGNOSIS — Z7982 Long term (current) use of aspirin: Secondary | ICD-10-CM | POA: Insufficient documentation

## 2015-02-06 DIAGNOSIS — R921 Mammographic calcification found on diagnostic imaging of breast: Secondary | ICD-10-CM

## 2015-02-06 DIAGNOSIS — Z8371 Family history of colonic polyps: Secondary | ICD-10-CM | POA: Insufficient documentation

## 2015-02-06 DIAGNOSIS — Z888 Allergy status to other drugs, medicaments and biological substances status: Secondary | ICD-10-CM | POA: Insufficient documentation

## 2015-02-06 DIAGNOSIS — Z1211 Encounter for screening for malignant neoplasm of colon: Secondary | ICD-10-CM | POA: Diagnosis not present

## 2015-02-06 DIAGNOSIS — Z79899 Other long term (current) drug therapy: Secondary | ICD-10-CM | POA: Insufficient documentation

## 2015-02-06 HISTORY — PX: COLONOSCOPY: SHX5424

## 2015-02-06 SURGERY — COLONOSCOPY
Anesthesia: General

## 2015-02-06 MED ORDER — SODIUM CHLORIDE 0.9 % IV SOLN: INTRAVENOUS | Status: DC

## 2015-02-06 MED ORDER — SODIUM CHLORIDE 0.9 % IV SOLN
INTRAVENOUS | Status: DC
  Administered 2015-02-06 (×2): via INTRAVENOUS

## 2015-02-06 MED ORDER — PROPOFOL INFUSION 10 MG/ML OPTIME
INTRAVENOUS | Status: DC | PRN
  Administered 2015-02-06: 120 ug/kg/min via INTRAVENOUS

## 2015-02-06 MED ORDER — LIDOCAINE HCL (CARDIAC) 20 MG/ML IV SOLN
INTRAVENOUS | Status: DC | PRN
  Administered 2015-02-06: 40 mg via INTRAVENOUS
  Administered 2015-02-06: 60 mg via INTRAVENOUS

## 2015-02-06 NOTE — H&P (Signed)
  Here for colonoscopy  PMH: see above Meds:see above Allergies: cardizem  PE; NAD, VSS CV:RRR Lungs:CTA Abdomen:NABS, soft, nontender, -HSM  Imp: Screening, Family hx of colon polyps Plan: colonoscopy

## 2015-02-06 NOTE — Anesthesia Postprocedure Evaluation (Signed)
  Anesthesia Post-op Note  Patient: Morgan Ponce  Procedure(s) Performed: Procedure(s): COLONOSCOPY (N/A)  Anesthesia type:General  Patient location: PACU  Post pain: Pain level controlled  Post assessment: Post-op Vital signs reviewed, Patient's Cardiovascular Status Stable, Respiratory Function Stable, Patent Airway and No signs of Nausea or vomiting  Post vital signs: Reviewed and stable  Last Vitals:  Filed Vitals:   02/06/15 1017  BP: 109/60  Pulse: 73  Temp: 35.9 C  Resp: 18    Level of consciousness: awake, alert  and patient cooperative  Complications: No apparent anesthesia complications

## 2015-02-06 NOTE — Anesthesia Preprocedure Evaluation (Signed)
Anesthesia Evaluation  Patient identified by MRN, date of birth, ID band Patient awake    Reviewed: Allergy & Precautions, H&P , NPO status , Patient's Chart, lab work & pertinent test results, reviewed documented beta blocker date and time   Airway Mallampati: II  TM Distance: >3 FB Neck ROM: full    Dental no notable dental hx.    Pulmonary neg pulmonary ROS,  breath sounds clear to auscultation  Pulmonary exam normal       Cardiovascular Exercise Tolerance: Good hypertension, negative cardio ROS  Rhythm:regular Rate:Normal     Neuro/Psych negative neurological ROS  negative psych ROS   GI/Hepatic negative GI ROS, Neg liver ROS,   Endo/Other  negative endocrine ROS  Renal/GU negative Renal ROS  negative genitourinary   Musculoskeletal   Abdominal   Peds  Hematology negative hematology ROS (+)   Anesthesia Other Findings   Reproductive/Obstetrics negative OB ROS                             Anesthesia Physical Anesthesia Plan  ASA: II  Anesthesia Plan: General   Post-op Pain Management:    Induction:   Airway Management Planned:   Additional Equipment:   Intra-op Plan:   Post-operative Plan:   Informed Consent: I have reviewed the patients History and Physical, chart, labs and discussed the procedure including the risks, benefits and alternatives for the proposed anesthesia with the patient or authorized representative who has indicated his/her understanding and acceptance.   Dental Advisory Given  Plan Discussed with: CRNA  Anesthesia Plan Comments:         Anesthesia Quick Evaluation  

## 2015-02-06 NOTE — Transfer of Care (Signed)
Immediate Anesthesia Transfer of Care Note  Patient: Morgan Ponce  Procedure(s) Performed: Procedure(s): COLONOSCOPY (N/A)  Patient Location: Endoscopy Unit  Anesthesia Type:General  Level of Consciousness: awake, alert , oriented and patient cooperative  Airway & Oxygen Therapy: Patient Spontanous Breathing and Patient connected to nasal cannula oxygen  Post-op Assessment: Report given to RN, Post -op Vital signs reviewed and stable and Patient moving all extremities X 4  Post vital signs: Reviewed and stable  Last Vitals:  Filed Vitals:   02/06/15 1017  BP: 109/60  Pulse: 74  Temp: 35.9 C  Resp: 21    Complications: No apparent anesthesia complications

## 2015-02-06 NOTE — Op Note (Signed)
[facility] Gastroenterology Patient Name: Morgan Ponce Procedure Date: 02/06/2015 9:48 AM MRN: [id number] Account #: [id number] Date of Birth: 1941/03/28 Admit Type: Outpatient Age: 74 Room: [facility] ENDO ROOM 4 Gender: Female Note Status: Finalized Procedure:         Colonoscopy Indications:       Colon cancer screening in patient at increased risk:                     Family history of colon polyps Providers:         Lupita Dawn. Candace Cruise, MD Medicines:         Monitored Anesthesia Care Complications:     No immediate complications. Procedure:         Pre-Anesthesia Assessment:                    - Prior to the procedure, a History and Physical was                     performed, and patient medications, allergies and                     sensitivities were reviewed. The patient's tolerance of                     previous anesthesia was reviewed.                    - The risks and benefits of the procedure and the sedation                     options and risks were discussed with the patient. All                     questions were answered and informed consent was obtained.                    - After reviewing the risks and benefits, the patient was                     deemed in satisfactory condition to undergo the procedure.                    After obtaining informed consent, the colonoscope was                     passed under direct vision. Throughout the procedure, the                     patient's blood pressure, pulse, and oxygen saturations                     were monitored continuously. The Colonoscope was                     introduced through the anus and advanced to the the                     terminal ileum, with identification of the appendiceal                     orifice and IC valve. The colonoscopy was performed                     without difficulty. The patient tolerated the procedure  well. The quality of the bowel preparation was  good. Findings:      The terminal ileum appeared normal.      The colon (entire examined portion) appeared normal. Impression:        - The examined portion of the ileum was normal.                    - The entire examined colon is normal.                    - No specimens collected. Recommendation:    - Discharge patient to home.                    - The findings and recommendations were discussed with the                     patient. Procedure Code(s): --- Professional ---                    [id number], Colonoscopy, flexible; diagnostic, including                     collection of specimen(s) by brushing or washing, when                     performed (separate procedure) Diagnosis Code(s): --- Professional ---                    Z12.11, Encounter for screening for malignant neoplasm of                     colon CPT copyright 2014 American Medical Association. All rights reserved. The codes documented in this report are preliminary and upon coder review may  be revised to meet current compliance requirements. Hulen Luster, MD 02/06/2015 10:12:51 AM This report has been signed electronically. Number of Addenda: 0 Note Initiated On: 02/06/2015 9:48 AM Scope Withdrawal Time: 0 hours 6 minutes 2 seconds  Total Procedure Duration: 0 hours 10 minutes 19 seconds       [facility]

## 2015-02-08 ENCOUNTER — Encounter: Payer: Self-pay | Admitting: Gastroenterology

## 2015-03-13 ENCOUNTER — Other Ambulatory Visit: Payer: Self-pay | Admitting: Emergency Medicine

## 2015-03-13 DIAGNOSIS — I10 Essential (primary) hypertension: Secondary | ICD-10-CM

## 2015-03-13 MED ORDER — HYDROCHLOROTHIAZIDE 25 MG PO TABS: 25.0000 mg | ORAL_TABLET | Freq: Every day | ORAL | Status: DC

## 2015-04-07 DIAGNOSIS — M25561 Pain in right knee: Secondary | ICD-10-CM | POA: Diagnosis not present

## 2015-04-14 DIAGNOSIS — G8929 Other chronic pain: Secondary | ICD-10-CM | POA: Diagnosis not present

## 2015-04-14 DIAGNOSIS — M25561 Pain in right knee: Secondary | ICD-10-CM | POA: Diagnosis not present

## 2015-05-01 ENCOUNTER — Other Ambulatory Visit: Payer: Self-pay | Admitting: Family Medicine

## 2015-05-04 DIAGNOSIS — M25561 Pain in right knee: Secondary | ICD-10-CM | POA: Diagnosis not present

## 2015-06-10 ENCOUNTER — Ambulatory Visit (INDEPENDENT_AMBULATORY_CARE_PROVIDER_SITE_OTHER): Payer: Medicare Other

## 2015-06-10 DIAGNOSIS — Z23 Encounter for immunization: Secondary | ICD-10-CM | POA: Diagnosis not present

## 2015-06-16 DIAGNOSIS — M25561 Pain in right knee: Secondary | ICD-10-CM | POA: Diagnosis not present

## 2015-06-16 DIAGNOSIS — G8929 Other chronic pain: Secondary | ICD-10-CM | POA: Diagnosis not present

## 2015-08-14 ENCOUNTER — Telehealth: Payer: Self-pay | Admitting: *Deleted

## 2015-08-14 NOTE — Telephone Encounter (Signed)
Patient was wanting to know do we need to schedule her mammogram or her primary care. She was seen back in November 2015 and she is due for her mammogram. Patient was not in recalls.

## 2015-08-15 NOTE — Telephone Encounter (Signed)
Notes Recorded by Robert Bellow, MD on 08/24/2014 at 1:16 PM Benign biopsy, correlates with clinical impression. Patient aware of diagnosis.  She may resume annual screening mammograms with her PCP in Fall 2016.  Patient notified that she may have her mammogram done thru her PCP. Patient aware and agrees.

## 2015-08-21 DIAGNOSIS — D649 Anemia, unspecified: Secondary | ICD-10-CM | POA: Insufficient documentation

## 2015-08-21 DIAGNOSIS — Z862 Personal history of diseases of the blood and blood-forming organs and certain disorders involving the immune mechanism: Secondary | ICD-10-CM | POA: Insufficient documentation

## 2015-08-21 DIAGNOSIS — M810 Age-related osteoporosis without current pathological fracture: Secondary | ICD-10-CM | POA: Insufficient documentation

## 2015-08-21 DIAGNOSIS — B029 Zoster without complications: Secondary | ICD-10-CM | POA: Insufficient documentation

## 2015-08-22 ENCOUNTER — Ambulatory Visit (INDEPENDENT_AMBULATORY_CARE_PROVIDER_SITE_OTHER): Payer: Medicare Other | Admitting: Family Medicine

## 2015-08-22 ENCOUNTER — Encounter: Payer: Self-pay | Admitting: Family Medicine

## 2015-08-22 VITALS — BP 158/80 | HR 84 | Temp 97.6°F | Resp 16 | Ht 62.0 in | Wt 122.0 lb

## 2015-08-22 DIAGNOSIS — Z1239 Encounter for other screening for malignant neoplasm of breast: Secondary | ICD-10-CM | POA: Diagnosis not present

## 2015-08-22 DIAGNOSIS — R921 Mammographic calcification found on diagnostic imaging of breast: Secondary | ICD-10-CM

## 2015-08-22 DIAGNOSIS — Z0000 Encounter for general adult medical examination without abnormal findings: Secondary | ICD-10-CM

## 2015-08-22 DIAGNOSIS — R928 Other abnormal and inconclusive findings on diagnostic imaging of breast: Secondary | ICD-10-CM

## 2015-08-22 NOTE — Progress Notes (Signed)
Patient ID: Morgan Ponce, female   DOB: 1941/06/20, 74 y.o.   MRN: [id number] Patient: Morgan Ponce, Female    DOB: 07/30/1941, 74 y.o.   MRN: [id number] Visit Date: 08/22/2015  Today's Provider: [name], MD   Chief Complaint  Patient presents with  . Annual Exam   Subjective:   Morgan Ponce is a 74 y.o. female who presents today for her Subsequent Annual Wellness Visit. She feels well. She reports she is not exercising formally, but she does a lot of walking around the house. She reports she is sleeping well.  BMD- 12/2814 Mammogram- 08/16/14 Pap- 07/31/14 Colonoscopy- 02/27/10- normal pneumo23- 08/27/04 prevnar 01/16/15 Tdap-05/27/11 Zoster- 07/06/13    Review of Systems  Constitutional: Negative.   HENT: Negative.   Eyes: Negative.   Respiratory: Negative.   Cardiovascular: Negative.   Gastrointestinal: Negative.   Endocrine: Negative.   Genitourinary: Negative.   Musculoskeletal: Negative.   Skin: Negative.   Allergic/Immunologic: Negative.   Neurological: Negative.   Hematological: Negative.   Psychiatric/Behavioral: Negative.     Patient Active Problem List   Diagnosis Date Noted  . History of anemia 08/21/2015  . OP (osteoporosis) 08/21/2015  . Herpes zona 08/21/2015  . Breast calcifications on mammogram 08/16/2014  . Flutter-fibrillation 02/09/2010  . Avitaminosis D 12/12/2008  . Menopausal and postmenopausal disorder 09/13/2008  . BP (high blood pressure) 11/02/2007  . Hypercholesteremia 11/02/2007    Social History   Social History  . Marital Status: Married    Spouse Name: N/A  . Number of Children: N/A  . Years of Education: N/A   Occupational History  . Not on file.   Social History Main Topics  . Smoking status: Never Smoker   . Smokeless tobacco: Never Used  . Alcohol Use: No  . Drug Use: No  . Sexual Activity: Not on file   Other Topics Concern  . Not on file   Social History Narrative    Past Surgical History   Procedure Laterality Date  . Breast biopsy Right 1972  . Hysteroscopy  1999  . Cholecystectomy  2013  . Colonoscopy N/A 02/06/2015    Procedure: COLONOSCOPY;  Surgeon: Hulen Luster, MD;  Location: [facility] ENDOSCOPY;  Service: Gastroenterology;  Laterality: N/A;    Her family history includes Heart attack in her brother; Heart disease in her father; Hypertension in her father, mother, and sister; Stroke in her mother.    Outpatient Prescriptions Prior to Visit  Medication Sig Dispense Refill  . aspirin 81 MG tablet Take 81 mg by mouth daily.    . hydrochlorothiazide (HYDRODIURIL) 25 MG tablet Take 1 tablet (25 mg total) by mouth daily. 90 tablet 3  . lovastatin (MEVACOR) 10 MG tablet TAKE ONE TABLET BY MOUTH EVERY DAY 90 tablet 2  . Vitamin D, Cholecalciferol, 1000 UNITS TABS Take 1 tablet by mouth daily.     No facility-administered medications prior to visit.    Allergies  Allergen Reactions  . Cardizem [Diltiazem] Rash    Patient Care Team: Jerrol Banana., MD as PCP - General (Family Medicine) Jerrol Banana., MD (Family Medicine) Robert Bellow, MD (General Surgery)  Objective:   Vitals:  Filed Vitals:   08/22/15 0912  BP: 158/80  Pulse: 84  Temp: 97.6 F (36.4 C)  TempSrc: Oral  Resp: 16  Height: 5\' 2"  (1.575 m)  Weight: 122 lb (55.339 kg)    Physical Exam  Constitutional: She is oriented to person, place,  and time. She appears well-developed and well-nourished.  HENT:  Head: Normocephalic and atraumatic.  Right Ear: External ear normal.  Left Ear: External ear normal.  Nose: Nose normal.  Eyes: Conjunctivae are normal.  Neck: Neck supple.  Cardiovascular: Normal rate, regular rhythm, normal heart sounds and intact distal pulses.   Pulmonary/Chest: Effort normal and breath sounds normal.  Abdominal: Soft.  Neurological: She is alert and oriented to person, place, and time.  Skin: Skin is warm and dry.  Psychiatric: She has a normal mood and  affect. Her behavior is normal. Judgment and thought content normal.    Activities of Daily Living In your present state of health, do you have any difficulty performing the following activities: 08/22/2015  Hearing? Y  Vision? N  Difficulty concentrating or making decisions? N  Walking or climbing stairs? Y  Dressing or bathing? N  Doing errands, shopping? N    Fall Risk Assessment Fall Risk  08/22/2015  Falls in the past year? No     Depression Screen PHQ 2/9 Scores 08/22/2015  PHQ - 2 Score 0    Cognitive Testing - 6-CIT    Year: 0 points  Month: 0 points  Memorize "Pia Mau, [address] [place]"  Time (within 1 hour:) 0 points  Count backwards from 20: 0 points  Name months of year: 0 points  Repeat Address: 2  Points   Total Score: 2/28  Interpretation : Normal (0-7) Abnormal (8-28)    Assessment & Plan:     Annual Wellness Visit  Reviewed patient's Family Medical History Reviewed and updated list of patient's medical providers Assessment of cognitive impairment was done Assessed patient's functional ability Established a written schedule for health screening [place] Completed and Reviewed  Exercise Activities and Dietary recommendations Goals    None      Immunization History  Administered Date(s) Administered  . Influenza, High Dose Seasonal PF 06/10/2015  . Pneumococcal Conjugate-13 01/16/2015  . Pneumococcal Polysaccharide-23 08/27/2004  . Tdap 05/27/2011  . Zoster 07/06/2013    Health Maintenance  Topic Date Due  . INFLUENZA VACCINE  04/30/2016  . TETANUS/TDAP  05/26/2021  . DEXA SCAN  Completed  . ZOSTAVAX  Completed  . PNA vac Low Risk Adult  Completed      Discussed health benefits of physical activity, and encouraged her to engage in regular exercise appropriate for her age and condition.  Last BP at home was 100/47--asymptomatic. Pt had negative breast biopsy on 08/23/15. Miguel Aschoff  MD [facility] 08/22/2015 9:15 AM  ------------------------------------------------------------------------------------------------------------

## 2015-09-11 ENCOUNTER — Ambulatory Visit
Admission: RE | Admit: 2015-09-11 | Discharge: 2015-09-11 | Disposition: A | Discharge status: Home / Self Care | Payer: Medicare Other | Source: Ambulatory Visit | Attending: Family Medicine | Admitting: Family Medicine

## 2015-09-11 ENCOUNTER — Other Ambulatory Visit: Payer: Self-pay | Admitting: Family Medicine

## 2015-09-11 DIAGNOSIS — R928 Other abnormal and inconclusive findings on diagnostic imaging of breast: Secondary | ICD-10-CM | POA: Diagnosis not present

## 2015-09-11 DIAGNOSIS — R922 Inconclusive mammogram: Secondary | ICD-10-CM | POA: Diagnosis not present

## 2015-09-11 DIAGNOSIS — R921 Mammographic calcification found on diagnostic imaging of breast: Secondary | ICD-10-CM

## 2015-09-11 DIAGNOSIS — Z1239 Encounter for other screening for malignant neoplasm of breast: Secondary | ICD-10-CM

## 2016-01-24 ENCOUNTER — Other Ambulatory Visit: Payer: Self-pay | Admitting: Family Medicine

## 2016-02-19 ENCOUNTER — Ambulatory Visit: Payer: Self-pay | Admitting: Family Medicine

## 2016-02-29 ENCOUNTER — Ambulatory Visit: Payer: Self-pay | Admitting: Family Medicine

## 2016-03-21 ENCOUNTER — Other Ambulatory Visit: Payer: Self-pay | Admitting: Family Medicine

## 2016-03-21 DIAGNOSIS — I10 Essential (primary) hypertension: Secondary | ICD-10-CM

## 2016-03-21 DIAGNOSIS — H2513 Age-related nuclear cataract, bilateral: Secondary | ICD-10-CM | POA: Diagnosis not present

## 2016-03-21 NOTE — Telephone Encounter (Signed)
Has appointment scheduled 03/25/2016. Renaldo Fiddler, CMA

## 2016-03-25 ENCOUNTER — Other Ambulatory Visit: Payer: Self-pay | Admitting: Family Medicine

## 2016-03-25 ENCOUNTER — Ambulatory Visit (INDEPENDENT_AMBULATORY_CARE_PROVIDER_SITE_OTHER): Payer: Medicare Other | Admitting: Family Medicine

## 2016-03-25 VITALS — BP 158/78 | HR 72 | Temp 97.5°F | Resp 16 | Wt 123.0 lb

## 2016-03-25 DIAGNOSIS — E7800 Pure hypercholesterolemia, unspecified: Secondary | ICD-10-CM

## 2016-03-25 DIAGNOSIS — I10 Essential (primary) hypertension: Secondary | ICD-10-CM | POA: Diagnosis not present

## 2016-03-25 DIAGNOSIS — M79605 Pain in left leg: Secondary | ICD-10-CM | POA: Diagnosis not present

## 2016-03-25 NOTE — Progress Notes (Signed)
TREVA NASE  MRN: [id number] DOB: 1941-09-10  Subjective:  HPI   1. Essential hypertension The patient is a 75 year old female who presents for follow up of her hypertension.  She does not check her BP outside of the office.  She reports that she has white coat syndrome and her readings are always higher in the office.    2. Hypercholesteremia Patient is due to have her cholesterol checked today.  3. Pain of left lower extremity Patient complains that for the last month or so she has been having pain in the right lower part of her leg in the shin area.  She states that she notices it mostly when she is sitting but at times it wakes her at night.  She has had no known trauma to that part of her leg at any time.  She does report that she has torn meniscus, arthritis and sciatica on that right side.    Patient Active Problem List   Diagnosis Date Noted  . Hypertension 03/21/2016  . History of anemia 08/21/2015  . OP (osteoporosis) 08/21/2015  . Herpes zona 08/21/2015  . Breast calcifications on mammogram 08/16/2014  . Flutter-fibrillation 02/09/2010  . Avitaminosis D 12/12/2008  . Menopausal and postmenopausal disorder 09/13/2008  . BP (high blood pressure) 11/02/2007  . Hypercholesteremia 11/02/2007    Past Medical History  Diagnosis Date  . Hypertension   . Hyperlipidemia   . Arthritis     Social History   Social History  . Marital Status: Married    Spouse Name: N/A  . Number of Children: N/A  . Years of Education: N/A   Occupational History  . Not on file.   Social History Main Topics  . Smoking status: Never Smoker   . Smokeless tobacco: Never Used  . Alcohol Use: No  . Drug Use: No  . Sexual Activity: Not on file   Other Topics Concern  . Not on file   Social History Narrative    Outpatient Prescriptions Prior to Visit  Medication Sig Dispense Refill  . aspirin 81 MG tablet Take 81 mg by mouth daily.    . hydrochlorothiazide (HYDRODIURIL) 25 MG  tablet TAKE 1 TABLET BY MOUTH ONCE A DAY 90 tablet 3  . lovastatin (MEVACOR) 10 MG tablet TAKE 1 TABLET BY MOUTH ONCE A DAY 90 tablet 3  . Vitamin D, Cholecalciferol, 1000 UNITS TABS Take 1 tablet by mouth daily.     No facility-administered medications prior to visit.    Allergies  Allergen Reactions  . Cardizem [Diltiazem] Rash  . Diltiazem Hcl Rash    Review of Systems  Constitutional: Negative for fever and malaise/fatigue.  Eyes: Negative.   Respiratory: Negative for cough, shortness of breath and wheezing.   Cardiovascular: Negative for chest pain, palpitations, orthopnea and leg swelling.  Gastrointestinal: Negative.   Musculoskeletal: Positive for myalgias (Lower right leg).  Skin: Negative.   Neurological: Negative for weakness and headaches.  Endo/Heme/Allergies: Negative.   Psychiatric/Behavioral: Negative.    Objective:  BP 158/78 mmHg  Pulse 72  Temp(Src) 97.5 F (36.4 C) (Oral)  Resp 16  Wt 123 lb (55.792 kg)  Physical Exam  Constitutional: She is oriented to person, place, and time and well-developed, well-nourished, and in no distress.  HENT:  Head: Normocephalic and atraumatic.  Right Ear: External ear normal.  Left Ear: External ear normal.  Nose: Nose normal.  Eyes: Conjunctivae are normal. Pupils are equal, round, and reactive to light.  Neck:  Normal range of motion.  Cardiovascular: Normal rate, regular rhythm and normal heart sounds.   Moderate varicosed veins  Pulmonary/Chest: Effort normal and breath sounds normal.  Abdominal: Soft.  Neurological: She is alert and oriented to person, place, and time. Gait normal.  Skin: Skin is warm and dry.  Psychiatric: Mood, memory, affect and judgment normal.    Assessment and Plan :   1. Essential hypertension  - CBC with Differential/Platelet - Comprehensive metabolic panel - TSH  2. Hypercholesteremia  - Lipid Panel With LDL/HDL Ratio  3. Pain of left lower extremity This actually appears  to be possible shin splints. Workup or refer as indicated.   Miguel Aschoff MD [facility] 03/25/2016 10:30 AM

## 2016-03-26 LAB — CBC WITH DIFFERENTIAL/PLATELET
BASOS: 1 %
Basophils Absolute: 0 10*3/uL (ref 0.0–0.2)
EOS (ABSOLUTE): 0.1 10*3/uL (ref 0.0–0.4)
EOS: 2 %
HEMATOCRIT: 35.6 % (ref 34.0–46.6)
HEMOGLOBIN: 11.7 g/dL (ref 11.1–15.9)
IMMATURE GRANULOCYTES: 0 %
Immature Grans (Abs): 0 10*3/uL (ref 0.0–0.1)
LYMPHS: 29 %
Lymphocytes Absolute: 1.4 10*3/uL (ref 0.7–3.1)
MCH: 30.6 pg (ref 26.6–33.0)
MCHC: 32.9 g/dL (ref 31.5–35.7)
MCV: 93 fL (ref 79–97)
MONOCYTES: 9 %
MONOS ABS: 0.4 10*3/uL (ref 0.1–0.9)
NEUTROS PCT: 59 %
Neutrophils Absolute: 2.9 10*3/uL (ref 1.4–7.0)
Platelets: 240 10*3/uL (ref 150–379)
RBC: 3.82 x10E6/uL (ref 3.77–5.28)
RDW: 14.9 % (ref 12.3–15.4)
WBC: 4.9 10*3/uL (ref 3.4–10.8)

## 2016-03-26 LAB — COMPREHENSIVE METABOLIC PANEL
A/G RATIO: 1.1 — AB (ref 1.2–2.2)
ALK PHOS: 58 IU/L (ref 39–117)
ALT: 12 IU/L (ref 0–32)
AST: 26 IU/L (ref 0–40)
Albumin: 4.1 g/dL (ref 3.5–4.8)
BILIRUBIN TOTAL: 0.4 mg/dL (ref 0.0–1.2)
BUN / CREAT RATIO: 22 (ref 12–28)
BUN: 19 mg/dL (ref 8–27)
CALCIUM: 9.9 mg/dL (ref 8.7–10.3)
CHLORIDE: 99 mmol/L (ref 96–106)
CO2: 29 mmol/L (ref 18–29)
Creatinine, Ser: 0.87 mg/dL (ref 0.57–1.00)
GFR, EST AFRICAN AMERICAN: 74 mL/min/{1.73_m2} (ref >59)
GFR, EST NON AFRICAN AMERICAN: 64 mL/min/{1.73_m2} (ref >59)
GLOBULIN, TOTAL: 3.8 g/dL (ref 1.5–4.5)
Glucose: 90 mg/dL (ref 65–99)
POTASSIUM: 3.8 mmol/L (ref 3.5–5.2)
SODIUM: 143 mmol/L (ref 134–144)
Total Protein: 7.9 g/dL (ref 6.0–8.5)

## 2016-03-26 LAB — LIPID PANEL WITH LDL/HDL RATIO
Cholesterol, Total: 181 mg/dL (ref 100–199)
HDL: 69 mg/dL (ref >39)
LDL CALC: 95 mg/dL (ref 0–99)
LDL/HDL RATIO: 1.4 ratio (ref 0.0–3.2)
Triglycerides: 85 mg/dL (ref 0–149)
VLDL CHOLESTEROL CAL: 17 mg/dL (ref 5–40)

## 2016-03-26 LAB — TSH: TSH: 2.43 u[IU]/mL (ref 0.450–4.500)

## 2016-06-14 ENCOUNTER — Ambulatory Visit (INDEPENDENT_AMBULATORY_CARE_PROVIDER_SITE_OTHER): Payer: Medicare Other

## 2016-06-14 DIAGNOSIS — Z23 Encounter for immunization: Secondary | ICD-10-CM | POA: Diagnosis not present

## 2016-08-01 ENCOUNTER — Other Ambulatory Visit: Payer: Self-pay | Admitting: Family Medicine

## 2016-08-01 DIAGNOSIS — Z1231 Encounter for screening mammogram for malignant neoplasm of breast: Secondary | ICD-10-CM

## 2016-08-27 ENCOUNTER — Ambulatory Visit (INDEPENDENT_AMBULATORY_CARE_PROVIDER_SITE_OTHER): Payer: Medicare Other | Admitting: Family Medicine

## 2016-08-27 ENCOUNTER — Encounter: Payer: Self-pay | Admitting: Family Medicine

## 2016-08-27 VITALS — BP 162/70 | HR 82 | Temp 97.8°F | Resp 14 | Ht 62.5 in | Wt 123.0 lb

## 2016-08-27 DIAGNOSIS — Z0000 Encounter for general adult medical examination without abnormal findings: Secondary | ICD-10-CM

## 2016-08-27 NOTE — Progress Notes (Signed)
Patient: Morgan Ponce, Female    DOB: 1941/05/12, 75 y.o.   MRN: [id number] Visit Date: 08/27/2016  Today's Provider: [name], MD   Chief Complaint  Patient presents with  . Medicare Wellness   Subjective:   Morgan Ponce is a 75 y.o. female who presents today for her Subsequent Annual Wellness Visit. She feels well. She reports exercising no routine exercise except things around the house. She reports she is sleeping well. Immunization History  Administered Date(s) Administered  . Influenza, High Dose Seasonal PF 06/10/2015, 06/14/2016  . Pneumococcal Conjugate-13 01/16/2015  . Pneumococcal Polysaccharide-23 08/27/2004  . Tdap 05/27/2011  . Zoster 07/06/2013   Last  mammogram 09/11/15  BMD 01/26/15 osteopenia  Colonoscopy 02/06/15 normal Review of Systems  Constitutional: Negative.   HENT: Positive for hearing loss.   Eyes: Negative.   Respiratory: Negative.   Cardiovascular: Negative.   Gastrointestinal: Negative.   Endocrine: Negative.   Genitourinary: Negative.   Musculoskeletal: Negative.   Skin: Negative.   Allergic/Immunologic: Negative.   Neurological: Negative.   Hematological: Negative.   Psychiatric/Behavioral: Negative.     Patient Active Problem List   Diagnosis Date Noted  . Hypertension 03/21/2016  . History of anemia 08/21/2015  . OP (osteoporosis) 08/21/2015  . Herpes zona 08/21/2015  . Breast calcifications on mammogram 08/16/2014  . Flutter-fibrillation 02/09/2010  . Avitaminosis D 12/12/2008  . Menopausal and postmenopausal disorder 09/13/2008  . BP (high blood pressure) 11/02/2007  . Hypercholesteremia 11/02/2007    Social History   Social History  . Marital status: Married    Spouse name: N/A  . Number of children: N/A  . Years of education: N/A   Occupational History  . Not on file.   Social History Main Topics  . Smoking status: Never Smoker  . Smokeless tobacco: Never Used  . Alcohol use No  . Drug use: No  .  Sexual activity: No   Other Topics Concern  . Not on file   Social History Narrative  . No narrative on file    Past Surgical History:  Procedure Laterality Date  . BREAST BIOPSY Right 07/2014   benign. Done by Dr Collene Schlichter  . BREAST EXCISIONAL BIOPSY Right 1972   benign  . CHOLECYSTECTOMY  2013  . COLONOSCOPY N/A 02/06/2015   Procedure: COLONOSCOPY;  Surgeon: Hulen Luster, MD;  Location: [facility] ENDOSCOPY;  Service: Gastroenterology;  Laterality: N/A;  . HYSTEROSCOPY  1999    Her family history includes Breast cancer (age of onset: 40) in her sister; Heart attack in her brother; Heart disease in her father; Hypertension in her father, mother, and sister; Stroke in her mother.     Outpatient Encounter Prescriptions as of 08/27/2016  Medication Sig  . aspirin 81 MG tablet Take 81 mg by mouth daily.  . hydrochlorothiazide (HYDRODIURIL) 25 MG tablet TAKE 1 TABLET BY MOUTH ONCE A DAY  . lovastatin (MEVACOR) 10 MG tablet TAKE 1 TABLET BY MOUTH ONCE A DAY  . Vitamin D, Cholecalciferol, 1000 UNITS TABS Take 1 tablet by mouth daily.   No facility-administered encounter medications on file as of 08/27/2016.     Allergies  Allergen Reactions  . Cardizem [Diltiazem] Rash  . Diltiazem Hcl Rash    Patient Care Team: Jerrol Banana., MD as PCP - General (Family Medicine) Jerrol Banana., MD (Family Medicine) Robert Bellow, MD (General Surgery)   Objective:   Vitals:  Vitals:   08/27/16 0916  BP: (!) 162/70  Pulse: 82  Resp: 14  Temp: 97.8 F (36.6 C)  Weight: 123 lb (55.8 kg)  Height: 5' 2.5" (1.588 m)    Physical Exam  Constitutional: She is oriented to person, place, and time. She appears well-developed and well-nourished.  HENT:  Head: Normocephalic and atraumatic.  Right Ear: External ear normal.  Left Ear: External ear normal.  Eyes: Conjunctivae are normal. Pupils are equal, round, and reactive to light.  Neck: Normal range of motion. Neck supple.   Cardiovascular: Normal rate, regular rhythm, normal heart sounds and intact distal pulses.   No murmur heard. Pulmonary/Chest: Effort normal and breath sounds normal. No respiratory distress. She has no wheezes. Right breast exhibits no mass and no tenderness. Left breast exhibits no mass and no tenderness.  Normal breast exam.  Abdominal: She exhibits no distension. There is no tenderness.  Musculoskeletal: She exhibits no edema or tenderness.  Neurological: She is oriented to person, place, and time.  Skin: No rash noted. No erythema.  Psychiatric: She has a normal mood and affect. Her behavior is normal. Judgment and thought content normal.    Activities of Daily Living In your present state of health, do you have any difficulty performing the following activities: 08/27/2016  Hearing? Y  Vision? Y  Difficulty concentrating or making decisions? N  Walking or climbing stairs? Y  Dressing or bathing? N  Doing errands, shopping? N  Some recent data might be hidden    Fall Risk Assessment Fall Risk  08/27/2016 03/25/2016 08/22/2015  Falls in the past year? No No No     Depression Screen PHQ 2/9 Scores 08/27/2016 08/22/2015  PHQ - 2 Score 0 0    Cognitive Testing - 6-CIT    Year: 0 4 points  Month: 0 3 points  Memorize "Pia Mau, [address] [place]"  Time (within 1 hour:) 0 3 points  Count backwards from 20: 0 2 4 points  Name months of year: 0 2 4 points  Repeat Address: 0 2 4 6 8 10  points   Total Score: 2/28  Interpretation : Normal (0-7) Abnormal (8-28)    Assessment & Plan:     Annual Wellness Visit  Reviewed patient's Family Medical History Reviewed and updated list of patient's medical providers Assessment of cognitive impairment was done Assessed patient's functional ability Established a written schedule for health screening [place] Completed and Reviewed 1. Medicare annual wellness visit, subsequent Patient is up to date  on everything. She has mammogram scheduled for December.  HPI, Exam and A&P transcribed under direction and in the presence of Miguel Aschoff, MD. I have done the exam and reviewed the chart and it is accurate to the best of my knowledge. Development worker, community has been used and  any errors in dictation or transcription are unintentional. Miguel Aschoff M.D. [facility]

## 2016-09-16 ENCOUNTER — Ambulatory Visit
Admission: RE | Admit: 2016-09-16 | Discharge: 2016-09-16 | Disposition: A | Discharge status: Home / Self Care | Payer: Medicare Other | Source: Ambulatory Visit | Attending: Family Medicine | Admitting: Family Medicine

## 2016-09-16 DIAGNOSIS — Z1231 Encounter for screening mammogram for malignant neoplasm of breast: Secondary | ICD-10-CM | POA: Diagnosis not present

## 2016-12-24 ENCOUNTER — Other Ambulatory Visit: Payer: Self-pay | Admitting: Family Medicine

## 2016-12-24 DIAGNOSIS — I10 Essential (primary) hypertension: Secondary | ICD-10-CM

## 2017-01-13 ENCOUNTER — Other Ambulatory Visit: Payer: Self-pay | Admitting: Family Medicine

## 2017-02-17 ENCOUNTER — Ambulatory Visit (INDEPENDENT_AMBULATORY_CARE_PROVIDER_SITE_OTHER): Payer: Medicare Other | Admitting: Family Medicine

## 2017-02-17 ENCOUNTER — Encounter: Payer: Self-pay | Admitting: Family Medicine

## 2017-02-17 VITALS — BP 158/84 | HR 80 | Temp 98.0°F | Resp 12 | Wt 124.0 lb

## 2017-02-17 DIAGNOSIS — L821 Other seborrheic keratosis: Secondary | ICD-10-CM

## 2017-02-17 DIAGNOSIS — I10 Essential (primary) hypertension: Secondary | ICD-10-CM

## 2017-02-17 MED ORDER — LOSARTAN POTASSIUM 50 MG PO TABS: 50.0000 mg | ORAL_TABLET | Freq: Every day | ORAL | 5 refills | Status: DC

## 2017-02-17 NOTE — Progress Notes (Signed)
Patient: Morgan Ponce Female    DOB: 1941/04/18   76 y.o.   MRN: [id number] Visit Date: 02/17/2017  Today's Provider: [name], MD   Chief Complaint  Patient presents with  . Hypertension  . Rash   Subjective:    HPI  Hypertension, follow-up:  BP Readings from Last 3 Encounters:  02/17/17 (!) 158/84  08/27/16 (!) 162/70  03/25/16 (!) 158/78    She was last seen for hypertension 6 months ago.  BP at that visit was 162/70. Management since that visit includes no changes. She reports good compliance with treatment. She is not having side effects.  She reports no regular exercise, but she does stay active. She is adherent to low salt diet.   Outside blood pressures are checked daily. She reports that her BP at home averages in the 130s-120s/70s. She is experiencing none.  Patient denies exertional chest pressure/discomfort, lower extremity edema and palpitations.      Weight trend: stable Wt Readings from Last 3 Encounters:  02/17/17 124 lb (56.2 kg)  08/27/16 123 lb (55.8 kg)  03/25/16 123 lb (55.8 kg)    Current diet: well balanced   Rash: Patient reports that she has a "rough spot" on the right side of her forehead. She reports that it has been there for quite sometime. She denies any pain or itching.      Allergies  Allergen Reactions  . Cardizem [Diltiazem] Rash  . Diltiazem Hcl Rash     Current Outpatient Prescriptions:  .  aspirin 81 MG tablet, Take 81 mg by mouth daily., Disp: , Rfl:  .  hydrochlorothiazide (HYDRODIURIL) 25 MG tablet, TAKE 1 TABLET BY MOUTH ONCE DAILY, Disp: 90 tablet, Rfl: 3 .  lovastatin (MEVACOR) 10 MG tablet, TAKE 1 TABLET BY MOUTH ONCE A DAY, Disp: 90 tablet, Rfl: 3 .  Vitamin D, Cholecalciferol, 1000 UNITS TABS, Take 1 tablet by mouth daily., Disp: , Rfl:   Review of Systems  Constitutional: Negative.   Eyes: Negative.   Respiratory: Negative.   Cardiovascular: Negative.   Endocrine: Negative.     Musculoskeletal: Negative.   Skin: Positive for rash.  Allergic/Immunologic: Negative.   Neurological: Negative.   Psychiatric/Behavioral: Negative.     Social History  Substance Use Topics  . Smoking status: Never Smoker  . Smokeless tobacco: Never Used  . Alcohol use No   Objective:   BP (!) 158/84 (BP Location: Right Arm, Patient Position: Sitting, Cuff Size: Normal)   Pulse 80   Temp 98 F (36.7 C)   Resp 12   Wt 124 lb (56.2 kg)   SpO2 98%   BMI 22.32 kg/m  Vitals:   02/17/17 1022  BP: (!) 158/84  Pulse: 80  Resp: 12  Temp: 98 F (36.7 C)  SpO2: 98%  Weight: 124 lb (56.2 kg)     Physical Exam  Constitutional: She is oriented to person, place, and time. She appears well-developed and well-nourished.  HENT:  Head: Normocephalic and atraumatic.  Right Ear: External ear normal.  Left Ear: External ear normal.  Nose: Nose normal.  Cardiovascular: Normal rate, regular rhythm and normal heart sounds.   Pulmonary/Chest: Effort normal and breath sounds normal.  Abdominal: Soft.  Musculoskeletal: Normal range of motion. She exhibits no edema.  Neurological: She is alert and oriented to person, place, and time.  Skin: Skin is warm and dry.  Possible basal cell on forehead.  Assessment & Plan:     1. Essential hypertension  - losartan (COZAAR) 50 MG tablet; Take 1 tablet (50 mg total) by mouth daily.  Dispense: 30 tablet; Refill: 5 - CBC with Differential/Platelet - Comprehensive metabolic panel - Lipid panel - TSH  2. Keratosis, seborrheic  - Ambulatory referral to Dermatology       I have done the exam and reviewed the above chart and it is accurate to the best of my knowledge. Development worker, community has been used in this note in any air is in the dictation or transcription are unintentional.  [name], MD  [place]

## 2017-02-18 LAB — COMPREHENSIVE METABOLIC PANEL
A/G RATIO: 1.4 (ref 1.2–2.2)
ALT: 12 IU/L (ref 0–32)
AST: 28 IU/L (ref 0–40)
Albumin: 4.5 g/dL (ref 3.5–4.8)
Alkaline Phosphatase: 68 IU/L (ref 39–117)
BILIRUBIN TOTAL: 0.5 mg/dL (ref 0.0–1.2)
BUN/Creatinine Ratio: 20 (ref 12–28)
BUN: 21 mg/dL (ref 8–27)
CHLORIDE: 99 mmol/L (ref 96–106)
CO2: 23 mmol/L (ref 18–29)
Calcium: 10.2 mg/dL (ref 8.7–10.3)
Creatinine, Ser: 1.04 mg/dL — ABNORMAL HIGH (ref 0.57–1.00)
GFR calc Af Amer: 59 mL/min/{1.73_m2} — ABNORMAL LOW (ref >59)
GFR calc non Af Amer: 52 mL/min/{1.73_m2} — ABNORMAL LOW (ref >59)
GLUCOSE: 99 mg/dL (ref 65–99)
Globulin, Total: 3.3 g/dL (ref 1.5–4.5)
POTASSIUM: 4.2 mmol/L (ref 3.5–5.2)
Sodium: 141 mmol/L (ref 134–144)
Total Protein: 7.8 g/dL (ref 6.0–8.5)

## 2017-02-18 LAB — CBC WITH DIFFERENTIAL/PLATELET
BASOS ABS: 0 10*3/uL (ref 0.0–0.2)
Basos: 0 %
EOS (ABSOLUTE): 0.1 10*3/uL (ref 0.0–0.4)
Eos: 2 %
Hematocrit: 37.7 % (ref 34.0–46.6)
Hemoglobin: 12 g/dL (ref 11.1–15.9)
IMMATURE GRANS (ABS): 0 10*3/uL (ref 0.0–0.1)
Immature Granulocytes: 0 %
LYMPHS: 27 %
Lymphocytes Absolute: 1.4 10*3/uL (ref 0.7–3.1)
MCH: 30.3 pg (ref 26.6–33.0)
MCHC: 31.8 g/dL (ref 31.5–35.7)
MCV: 95 fL (ref 79–97)
Monocytes Absolute: 0.4 10*3/uL (ref 0.1–0.9)
Monocytes: 7 %
NEUTROS ABS: 3.3 10*3/uL (ref 1.4–7.0)
Neutrophils: 64 %
Platelets: 245 10*3/uL (ref 150–379)
RBC: 3.96 x10E6/uL (ref 3.77–5.28)
RDW: 14.8 % (ref 12.3–15.4)
WBC: 5.2 10*3/uL (ref 3.4–10.8)

## 2017-02-18 LAB — LIPID PANEL
CHOLESTEROL TOTAL: 191 mg/dL (ref 100–199)
Chol/HDL Ratio: 2.9 ratio (ref 0.0–4.4)
HDL: 66 mg/dL (ref >39)
LDL Calculated: 106 mg/dL — ABNORMAL HIGH (ref 0–99)
Triglycerides: 94 mg/dL (ref 0–149)
VLDL CHOLESTEROL CAL: 19 mg/dL (ref 5–40)

## 2017-02-18 LAB — TSH: TSH: 3.22 u[IU]/mL (ref 0.450–4.500)

## 2017-03-24 DIAGNOSIS — H2511 Age-related nuclear cataract, right eye: Secondary | ICD-10-CM | POA: Diagnosis not present

## 2017-04-08 ENCOUNTER — Other Ambulatory Visit: Payer: Self-pay | Admitting: Family Medicine

## 2017-04-08 DIAGNOSIS — I10 Essential (primary) hypertension: Secondary | ICD-10-CM

## 2017-04-08 MED ORDER — LOSARTAN POTASSIUM 50 MG PO TABS
50.0000 mg | ORAL_TABLET | Freq: Every day | ORAL | 2 refills | Status: DC
Start: 2017-04-08 — End: 2017-12-22

## 2017-04-08 NOTE — Telephone Encounter (Signed)
[facility] faxed a request for a 90-day supply for the following medication. Thanks CC  losartan (COZAAR) 50 MG tablet  >

## 2017-04-21 ENCOUNTER — Ambulatory Visit (INDEPENDENT_AMBULATORY_CARE_PROVIDER_SITE_OTHER): Payer: Medicare Other | Admitting: Family Medicine

## 2017-04-21 VITALS — BP 140/82 | HR 88 | Temp 97.6°F | Resp 14 | Wt 123.0 lb

## 2017-04-21 DIAGNOSIS — I129 Hypertensive chronic kidney disease with stage 1 through stage 4 chronic kidney disease, or unspecified chronic kidney disease: Secondary | ICD-10-CM

## 2017-04-21 DIAGNOSIS — M1711 Unilateral primary osteoarthritis, right knee: Secondary | ICD-10-CM

## 2017-04-21 DIAGNOSIS — M818 Other osteoporosis without current pathological fracture: Secondary | ICD-10-CM | POA: Diagnosis not present

## 2017-04-21 DIAGNOSIS — I10 Essential (primary) hypertension: Secondary | ICD-10-CM | POA: Diagnosis not present

## 2017-04-21 NOTE — Patient Instructions (Signed)
Patient is to stop her HCTZ but continue with her Losartan.    Our office will arrange for Bone density and contact patient with appointment.

## 2017-04-21 NOTE — Progress Notes (Signed)
Morgan Ponce  MRN: [id number] DOB: December 11, 1940  Subjective:  HPI   The patient is a 76 year old female who presents for follow up of her hypertension.  She was last seen on 02/17/17.  Her BP reading at that time ws 158/84.  She was started on Losartan 50 mg in addition to her HCTZ.   The patient reports having no adverse effects of the medicine and brings in a record of her home readings.  Those readings are in the range of 95-126 over 60's-80's.  The patient reports a reading of 88/57 once.  She did have dizziness with that reading.  She reports having no other episodes of dizziness. Her insurance nurse was at the home on 03/06/17 and when she checked the pressure it was 140/78.  The patient reports that she has always had white coat effect.  BP Readings from Last 3 Encounters:  04/21/17 (!) 150/76  02/17/17 (!) 158/84  08/27/16 (!) 162/70   The patient had labs done on her last visit and it revealed mild decrease in kidney function and she was advised that we would recheck her labs again today.  Lab Results  Component Value Date   CREATININE 1.04 (H) 02/17/2017   BUN 21 02/17/2017   NA 141 02/17/2017   K 4.2 02/17/2017   CL 99 02/17/2017   CO2 23 02/17/2017    During her last appointment the patient was referred to dermatology for SK and she has appointment on April 29, 2017.  Patient Active Problem List   Diagnosis Date Noted  . Hypertension 03/21/2016  . History of anemia 08/21/2015  . OP (osteoporosis) 08/21/2015  . Herpes zona 08/21/2015  . Breast calcifications on mammogram 08/16/2014  . Flutter-fibrillation 02/09/2010  . Avitaminosis D 12/12/2008  . Menopausal and postmenopausal disorder 09/13/2008  . BP (high blood pressure) 11/02/2007  . Hypercholesteremia 11/02/2007    Past Medical History:  Diagnosis Date  . Arthritis   . Hyperlipidemia   . Hypertension     Social History   Social History  . Marital status: Married    Spouse name: N/A  . Number of  children: N/A  . Years of education: N/A   Occupational History  . Not on file.   Social History Main Topics  . Smoking status: Never Smoker  . Smokeless tobacco: Never Used  . Alcohol use No  . Drug use: No  . Sexual activity: No   Other Topics Concern  . Not on file   Social History Narrative  . No narrative on file    Outpatient Encounter Prescriptions as of 04/21/2017  Medication Sig  . aspirin 81 MG tablet Take 81 mg by mouth daily.  . Glycerin-Polysorbate 80 (REFRESH DRY EYE THERAPY OP) Apply to eye.  . hydrochlorothiazide (HYDRODIURIL) 25 MG tablet TAKE 1 TABLET BY MOUTH ONCE DAILY  . losartan (COZAAR) 50 MG tablet Take 1 tablet (50 mg total) by mouth daily.  Marland Kitchen lovastatin (MEVACOR) 10 MG tablet TAKE 1 TABLET BY MOUTH ONCE A DAY  . Vitamin D, Cholecalciferol, 1000 UNITS TABS Take 1 tablet by mouth daily.   No facility-administered encounter medications on file as of 04/21/2017.     Allergies  Allergen Reactions  . Cardizem [Diltiazem] Rash  . Diltiazem Hcl Rash    Review of Systems  Constitutional: Negative for fever and malaise/fatigue.  Respiratory: Negative for cough, shortness of breath and wheezing.   Cardiovascular: Negative for chest pain, palpitations, orthopnea, claudication, leg swelling and  PND.  Neurological: Positive for dizziness (One episode with her BP low). Negative for weakness and headaches.    Objective:  BP (!) 150/76 (BP Location: Right Arm, Patient Position: Sitting, Cuff Size: Normal)   Pulse 88   Temp 97.6 F (36.4 C) (Oral)   Resp 14   Wt 123 lb (55.8 kg)   BMI 22.14 kg/m   Physical Exam  Constitutional: She is well-developed, well-nourished, and in no distress.  HENT:  Head: Normocephalic and atraumatic.  Eyes: Pupils are equal, round, and reactive to light. Conjunctivae are normal.  Neck: Normal range of motion.  Cardiovascular: Normal rate, regular rhythm and normal heart sounds.   Pulmonary/Chest: Effort normal and breath  sounds normal.  Musculoskeletal: She exhibits no edema.    Assessment and Plan :   1. Essential hypertension Patient is instructed to stop her HCTZ  2. Other osteoporosis without current pathological fracture Patient is currently off any treatment and due for her BMD.  Will order this today and await results for further treatment.  - DG Bone Density; Future  3. Osteoarthritis of right knee, unspecified osteoarthritis type   4. Hypertensive nephropathy Patient is instructed to stop her HCTZ  - Renal function panel   HPI, Exam and A&P Transcribed under the direction and in the presence of Miguel Aschoff, Brooke Bonito., MD. Electronically Signed: Althea Charon, RMA I have done the exam and reviewed the chart and it is accurate to the best of my knowledge. Development worker, community has been used and  any errors in dictation or transcription are unintentional. Miguel Aschoff M.D. [facility]

## 2017-04-22 LAB — RENAL FUNCTION PANEL
ALBUMIN: 4.2 g/dL (ref 3.5–4.8)
BUN/Creatinine Ratio: 21 (ref 12–28)
BUN: 19 mg/dL (ref 8–27)
CALCIUM: 9.7 mg/dL (ref 8.7–10.3)
CO2: 28 mmol/L (ref 20–29)
CREATININE: 0.89 mg/dL (ref 0.57–1.00)
Chloride: 98 mmol/L (ref 96–106)
GFR calc Af Amer: 72 mL/min/{1.73_m2} (ref >59)
GFR, EST NON AFRICAN AMERICAN: 62 mL/min/{1.73_m2} (ref >59)
Glucose: 98 mg/dL (ref 65–99)
PHOSPHORUS: 3.6 mg/dL (ref 2.5–4.5)
POTASSIUM: 3.8 mmol/L (ref 3.5–5.2)
Sodium: 139 mmol/L (ref 134–144)

## 2017-04-23 DIAGNOSIS — H2511 Age-related nuclear cataract, right eye: Secondary | ICD-10-CM | POA: Diagnosis not present

## 2017-04-29 DIAGNOSIS — D0439 Carcinoma in situ of skin of other parts of face: Secondary | ICD-10-CM | POA: Diagnosis not present

## 2017-04-29 DIAGNOSIS — L570 Actinic keratosis: Secondary | ICD-10-CM | POA: Diagnosis not present

## 2017-04-29 DIAGNOSIS — D2239 Melanocytic nevi of other parts of face: Secondary | ICD-10-CM | POA: Diagnosis not present

## 2017-04-29 DIAGNOSIS — D485 Neoplasm of uncertain behavior of skin: Secondary | ICD-10-CM | POA: Diagnosis not present

## 2017-04-30 ENCOUNTER — Encounter: Payer: Self-pay | Admitting: *Deleted

## 2017-05-06 ENCOUNTER — Ambulatory Visit
Admission: RE | Admit: 2017-05-06 | Discharge: 2017-05-06 | Disposition: A | Discharge status: Home / Self Care | Payer: Medicare Other | Source: Ambulatory Visit | Attending: Ophthalmology | Admitting: Ophthalmology

## 2017-05-06 ENCOUNTER — Ambulatory Visit: Payer: Medicare Other | Admitting: Anesthesiology

## 2017-05-06 ENCOUNTER — Encounter
Admission: RE | Disposition: A | Discharge status: Home / Self Care | Payer: Self-pay | Source: Ambulatory Visit | Attending: Ophthalmology

## 2017-05-06 ENCOUNTER — Encounter: Payer: Self-pay | Admitting: *Deleted

## 2017-05-06 DIAGNOSIS — Z79899 Other long term (current) drug therapy: Secondary | ICD-10-CM | POA: Insufficient documentation

## 2017-05-06 DIAGNOSIS — I10 Essential (primary) hypertension: Secondary | ICD-10-CM | POA: Diagnosis not present

## 2017-05-06 DIAGNOSIS — H2511 Age-related nuclear cataract, right eye: Secondary | ICD-10-CM | POA: Insufficient documentation

## 2017-05-06 DIAGNOSIS — E7800 Pure hypercholesterolemia, unspecified: Secondary | ICD-10-CM | POA: Diagnosis not present

## 2017-05-06 HISTORY — DX: Unspecified malignant neoplasm of skin of other parts of face: C44.309

## 2017-05-06 HISTORY — PX: CATARACT EXTRACTION W/PHACO: SHX586

## 2017-05-06 HISTORY — DX: Cardiac arrhythmia, unspecified: I49.9

## 2017-05-06 HISTORY — DX: Pain, unspecified: R52

## 2017-05-06 HISTORY — DX: Unspecified hearing loss, unspecified ear: H91.90

## 2017-05-06 SURGERY — PHACOEMULSIFICATION, CATARACT, WITH IOL INSERTION
Anesthesia: Monitor Anesthesia Care | Site: Eye | Laterality: Right | Wound class: Clean

## 2017-05-06 MED ORDER — MOXIFLOXACIN HCL 0.5 % OP SOLN
OPHTHALMIC | Status: AC
  Filled 2017-05-06: qty 3

## 2017-05-06 MED ORDER — EPINEPHRINE PF 1 MG/ML IJ SOLN
INTRAMUSCULAR | Status: AC
  Filled 2017-05-06: qty 1

## 2017-05-06 MED ORDER — ARMC OPHTHALMIC DILATING DROPS
OPHTHALMIC | Status: AC
  Filled 2017-05-06: qty 0.4

## 2017-05-06 MED ORDER — LIDOCAINE HCL (PF) 4 % IJ SOLN
INTRAMUSCULAR | Status: AC
  Filled 2017-05-06: qty 5

## 2017-05-06 MED ORDER — POVIDONE-IODINE 5 % OP SOLN
OPHTHALMIC | Status: DC | PRN
  Administered 2017-05-06: 1 via OPHTHALMIC

## 2017-05-06 MED ORDER — POVIDONE-IODINE 5 % OP SOLN
OPHTHALMIC | Status: AC
  Filled 2017-05-06: qty 30

## 2017-05-06 MED ORDER — EPINEPHRINE PF 1 MG/ML IJ SOLN
INTRAMUSCULAR | Status: DC | PRN
  Administered 2017-05-06: 1 mL via OPHTHALMIC

## 2017-05-06 MED ORDER — MIDAZOLAM HCL 2 MG/2ML IJ SOLN
INTRAMUSCULAR | Status: DC | PRN
  Administered 2017-05-06: 1 mg via INTRAVENOUS

## 2017-05-06 MED ORDER — MOXIFLOXACIN HCL 0.5 % OP SOLN
OPHTHALMIC | Status: DC | PRN
  Administered 2017-05-06: .2 mL via OPHTHALMIC

## 2017-05-06 MED ORDER — MIDAZOLAM HCL 2 MG/2ML IJ SOLN
INTRAMUSCULAR | Status: AC
  Filled 2017-05-06: qty 2

## 2017-05-06 MED ORDER — NA CHONDROIT SULF-NA HYALURON 40-17 MG/ML IO SOLN
INTRAOCULAR | Status: DC | PRN
  Administered 2017-05-06: 1 mL via INTRAOCULAR

## 2017-05-06 MED ORDER — CARBACHOL 0.01 % IO SOLN
INTRAOCULAR | Status: DC | PRN
  Administered 2017-05-06: .5 mL via INTRAOCULAR

## 2017-05-06 MED ORDER — MOXIFLOXACIN HCL 0.5 % OP SOLN: 1.0000 [drp] | OPHTHALMIC | Status: DC | PRN

## 2017-05-06 MED ORDER — NA CHONDROIT SULF-NA HYALURON 40-17 MG/ML IO SOLN
INTRAOCULAR | Status: AC
  Filled 2017-05-06: qty 1

## 2017-05-06 MED ORDER — LIDOCAINE HCL (PF) 4 % IJ SOLN
INTRAOCULAR | Status: DC | PRN
  Administered 2017-05-06: 2 mL via OPHTHALMIC

## 2017-05-06 MED ORDER — SODIUM CHLORIDE 0.9 % IV SOLN
INTRAVENOUS | Status: DC
  Administered 2017-05-06: 10:00:00 via INTRAVENOUS

## 2017-05-06 MED ORDER — ARMC OPHTHALMIC DILATING DROPS
1.0000 "application " | OPHTHALMIC | Status: AC
  Administered 2017-05-06 (×3): 1 via OPHTHALMIC

## 2017-05-06 SURGICAL SUPPLY — 16 items
GLOVE BIO SURGEON STRL SZ8 (GLOVE) ×2 IMPLANT
GLOVE BIOGEL M 6.5 STRL (GLOVE) ×2 IMPLANT
GLOVE SURG LX 8.0 MICRO (GLOVE) ×1
GLOVE SURG LX STRL 8.0 MICRO (GLOVE) ×1 IMPLANT
GOWN STRL REUS W/ TWL LRG LVL3 (GOWN DISPOSABLE) ×2 IMPLANT
GOWN STRL REUS W/TWL LRG LVL3 (GOWN DISPOSABLE) ×2
LABEL CATARACT MEDS ST (LABEL) ×2 IMPLANT
LENS IOL TECNIS ITEC 16.0 (Intraocular Lens) ×2 IMPLANT
PACK CATARACT (MISCELLANEOUS) ×2 IMPLANT
PACK CATARACT BRASINGTON LX (MISCELLANEOUS) ×2 IMPLANT
PACK EYE AFTER SURG (MISCELLANEOUS) ×2 IMPLANT
SOL BSS BAG (MISCELLANEOUS) ×2
SOLUTION BSS BAG (MISCELLANEOUS) ×1 IMPLANT
SYR 5ML LL (SYRINGE) ×2 IMPLANT
WATER STERILE IRR 250ML POUR (IV SOLUTION) ×2 IMPLANT
WIPE NON LINTING 3.25X3.25 (MISCELLANEOUS) ×2 IMPLANT

## 2017-05-06 NOTE — Anesthesia Post-op Follow-up Note (Signed)
Anesthesia QCDR form completed.        

## 2017-05-06 NOTE — Anesthesia Preprocedure Evaluation (Signed)
Anesthesia Evaluation  Patient identified by MRN, date of birth, ID band Patient awake    Reviewed: Allergy & Precautions, H&P , NPO status , Patient's Chart, lab work & pertinent test results, reviewed documented beta blocker date and time   History of Anesthesia Complications Negative for: history of anesthetic complications  Airway Mallampati: II  TM Distance: >3 FB Neck ROM: full    Dental no notable dental hx.    Pulmonary neg pulmonary ROS,    Pulmonary exam normal breath sounds clear to auscultation       Cardiovascular Exercise Tolerance: Good hypertension, negative cardio ROS  + dysrhythmias  Rhythm:regular Rate:Normal     Neuro/Psych negative neurological ROS  negative psych ROS   GI/Hepatic negative GI ROS, Neg liver ROS,   Endo/Other  negative endocrine ROS  Renal/GU negative Renal ROS  negative genitourinary   Musculoskeletal  (+) Arthritis ,   Abdominal   Peds  Hematology negative hematology ROS (+)   Anesthesia Other Findings Past Medical History: No date: Arthritis No date: Dysrhythmia No date: HOH (hard of hearing)     Comment:  AIDS No date: Hyperlipidemia No date: Hypertension No date: Pain     Comment:  RIGHT KNEE No date: Skin cancer of forehead   Reproductive/Obstetrics negative OB ROS                             Anesthesia Physical  Anesthesia Plan  ASA: III  Anesthesia Plan: MAC   Post-op Pain Management:    Induction: Intravenous  PONV Risk Score and Plan:   Airway Management Planned: Natural Airway and Nasal Cannula  Additional Equipment:   Intra-op Plan:   Post-operative Plan:   Informed Consent: I have reviewed the patients History and Physical, chart, labs and discussed the procedure including the risks, benefits and alternatives for the proposed anesthesia with the patient or authorized representative who has indicated his/her  understanding and acceptance.   Dental Advisory Given  Plan Discussed with: CRNA  Anesthesia Plan Comments: (Patient consented for risks of anesthesia including but not limited to:  - adverse reactions to medications - damage to teeth, lips or other oral mucosa - sore throat or hoarseness - Damage to heart, brain, lungs or loss of life  Patient voiced understanding.)        Anesthesia Quick Evaluation

## 2017-05-06 NOTE — Transfer of Care (Signed)
Immediate Anesthesia Transfer of Care Note  Patient: Charlaine P Goodbar  Procedure(s) Performed: Procedure(s) with comments: CATARACT EXTRACTION PHACO AND INTRAOCULAR LENS PLACEMENT (IOC) (Right) - [place] 00:40 AP% 17.6 CDE 7.16 Fluid pack lot # [id number]  Patient Location: Short Stay  Anesthesia Type:MAC  Level of Consciousness: awake, alert  and oriented  Airway & Oxygen Therapy: Patient Spontanous Breathing  Post-op Assessment: Report given to RN  Post vital signs: stable  Last Vitals:  Vitals:   05/06/17 1000 05/06/17 1128  BP: (!) 189/75 (!) 164/71  Pulse: 82 70  Resp: 14 10  Temp: 36.8 C 36.7 C    Last Pain:  Vitals:   05/06/17 1128  TempSrc: Oral         Complications: No apparent anesthesia complications

## 2017-05-06 NOTE — Discharge Instructions (Signed)
Eye Surgery Discharge Instructions  Expect mild scratchy sensation or mild soreness. DO NOT RUB YOUR EYE!  The day of surgery:  Minimal physical activity, but bed rest is not required  No reading, computer work, or close hand work  No bending, lifting, or straining.  May watch TV  For 24 hours:  No driving, legal decisions, or alcoholic beverages  Safety precautions  Eat anything you prefer: It is better to start with liquids, then soup then solid foods.  _____ Eye patch should be worn until postoperative exam tomorrow.  ____ Solar shield eyeglasses should be worn for comfort in the sunlight/patch while sleeping  Resume all regular medications including aspirin or Coumadin if these were discontinued prior to surgery. You may shower, bathe, shave, or wash your hair. Tylenol may be taken for mild discomfort.  Call your doctor if you experience significant pain, nausea, or vomiting, fever > 101 or other signs of infection. [phone] or [phone] Specific instructions:  Follow-up Information    Birder Robson, MD Follow up.   Specialty:  Ophthalmology Why:  August 8 at 9:50am Contact information: [address] [place] [address] [phone]

## 2017-05-06 NOTE — Anesthesia Postprocedure Evaluation (Signed)
Anesthesia Post Note  Patient: Morgan Ponce  Procedure(s) Performed: Procedure(s) (LRB): CATARACT EXTRACTION PHACO AND INTRAOCULAR LENS PLACEMENT (IOC) (Right)  Patient location during evaluation: Short Stay Anesthesia Type: MAC Level of consciousness: awake and alert Pain management: pain level controlled Vital Signs Assessment: post-procedure vital signs reviewed and stable Respiratory status: spontaneous breathing, nonlabored ventilation, respiratory function stable and patient connected to nasal cannula oxygen Cardiovascular status: stable and blood pressure returned to baseline Anesthetic complications: no     Last Vitals:  Vitals:   05/06/17 1000 05/06/17 1128  BP: (!) 189/75 (!) 164/71  Pulse: 82 70  Resp: 14 10  Temp: 36.8 C 36.7 C    Last Pain:  Vitals:   05/06/17 1128  TempSrc: Oral                 Estill Batten

## 2017-05-06 NOTE — H&P (Signed)
All labs reviewed. Abnormal studies sent to patients PCP when indicated.  Previous H&P reviewed, patient examined, there are NO CHANGES.  Morgan Mercer LOUIS8/7/201811:01 AM

## 2017-05-06 NOTE — Op Note (Signed)
PREOPERATIVE DIAGNOSIS:  Nuclear sclerotic cataract of the right eye.   POSTOPERATIVE DIAGNOSIS:  nuclear sclerotic cataract right eye   OPERATIVE PROCEDURE: Procedure(s): CATARACT EXTRACTION PHACO AND INTRAOCULAR LENS PLACEMENT (IOC)   SURGEON:  Birder Robson, MD.   ANESTHESIA:  Anesthesiologist: Piscitello, Precious Haws, MD CRNA: Aline Brochure, CRNA  1.      Managed anesthesia care. 2.      0.27ml of Shugarcaine was instilled in the eye following the paracentesis.   COMPLICATIONS:  None.   TECHNIQUE:   Stop and chop   DESCRIPTION OF PROCEDURE:  The patient was examined and consented in the preoperative holding area where the aforementioned topical anesthesia was applied to the right eye and then brought back to the Operating Room where the right eye was prepped and draped in the usual sterile ophthalmic fashion and a lid speculum was placed. A paracentesis was created with the side port blade and the anterior chamber was filled with viscoelastic. A near clear corneal incision was performed with the steel keratome. A continuous curvilinear capsulorrhexis was performed with a cystotome followed by the capsulorrhexis forceps. Hydrodissection and hydrodelineation were carried out with BSS on a blunt cannula. The lens was removed in a stop and chop  technique and the remaining cortical material was removed with the irrigation-aspiration handpiece. The capsular bag was inflated with viscoelastic and the Technis ZCB00  lens was placed in the capsular bag without complication. The remaining viscoelastic was removed from the eye with the irrigation-aspiration handpiece. The wounds were hydrated. The anterior chamber was flushed with Miostat and the eye was inflated to physiologic pressure. 0.11ml of Vigamox was placed in the anterior chamber. The wounds were found to be water tight. The eye was dressed with Vigamox. The patient was given protective glasses to wear throughout the day and a shield with  which to sleep tonight. The patient was also given drops with which to begin a drop regimen today and will follow-up with me in one day.  Implant Name Type Inv. Item Serial No. Manufacturer Lot No. LRB No. Used  LENS IOL DIOP 16.0 - [id number] Intraocular Lens LENS IOL DIOP 16.0 [id number]   Right 1   Procedure(s) with comments: CATARACT EXTRACTION PHACO AND INTRAOCULAR LENS PLACEMENT (IOC) (Right) - [place] 00:40 AP% 17.6 CDE 7.16 Fluid pack lot # [id number]  Electronically signed: [place] 05/06/2017 11:24 AM

## 2017-05-12 ENCOUNTER — Ambulatory Visit (INDEPENDENT_AMBULATORY_CARE_PROVIDER_SITE_OTHER): Payer: Medicare Other | Admitting: Family Medicine

## 2017-05-12 ENCOUNTER — Encounter: Payer: Self-pay | Admitting: Family Medicine

## 2017-05-12 VITALS — BP 158/88 | HR 98 | Temp 97.4°F | Resp 16 | Wt 123.0 lb

## 2017-05-12 DIAGNOSIS — R091 Pleurisy: Secondary | ICD-10-CM

## 2017-05-12 DIAGNOSIS — M549 Dorsalgia, unspecified: Secondary | ICD-10-CM | POA: Diagnosis not present

## 2017-05-12 LAB — POCT URINALYSIS DIPSTICK
Bilirubin, UA: NEGATIVE
Blood, UA: NEGATIVE
GLUCOSE UA: NEGATIVE
Ketones, UA: NEGATIVE
LEUKOCYTES UA: NEGATIVE
Nitrite, UA: NEGATIVE
PROTEIN UA: NEGATIVE
Spec Grav, UA: 1.025 (ref 1.010–1.025)
Urobilinogen, UA: 0.2 E.U./dL
pH, UA: 6 (ref 5.0–8.0)

## 2017-05-12 MED ORDER — TRAMADOL HCL 50 MG PO TABS: 50.0000 mg | ORAL_TABLET | ORAL | 0 refills | Status: DC

## 2017-05-12 MED ORDER — MELOXICAM 7.5 MG PO TABS: 7.5000 mg | ORAL_TABLET | Freq: Every day | ORAL | 0 refills | Status: DC

## 2017-05-12 NOTE — Progress Notes (Signed)
Subjective:  HPI  Pt is here today for back pain. She reports that it is located on her left side and is mid back. She reports that it radiates to the front of her around her ribs. She says it is a constant achy pain. She reports that she has been using the heating pad and tylenol. She thought it was gone this morning but after she had been up for a little while she reports that the pain came back and is worse. She reports that she has had a cough. She reports that it started about a week ago. She reports that it does not get worse with taking a deep breath or movement, sometimes movement and pressure makes the pain better.   Prior to Admission medications   Medication Sig Start Date End Date Taking? Authorizing Provider  aspirin 81 MG tablet Take 81 mg by mouth daily.    [provider]  Glycerin-Polysorbate 80 (REFRESH DRY EYE THERAPY OP) Apply to eye.    [provider]  losartan (COZAAR) 50 MG tablet Take 1 tablet (50 mg total) by mouth daily. 04/08/17   Jerrol Banana., MD  lovastatin (MEVACOR) 10 MG tablet TAKE 1 TABLET BY MOUTH ONCE A DAY 01/13/17   Jerrol Banana., MD  Vitamin D, Cholecalciferol, 1000 UNITS TABS Take 1 tablet by mouth daily.    [provider]    Patient Active Problem List   Diagnosis Date Noted  . Hypertension 03/21/2016  . History of anemia 08/21/2015  . OP (osteoporosis) 08/21/2015  . Herpes zona 08/21/2015  . Breast calcifications on mammogram 08/16/2014  . Flutter-fibrillation 02/09/2010  . Avitaminosis D 12/12/2008  . Menopausal and postmenopausal disorder 09/13/2008  . BP (high blood pressure) 11/02/2007  . Hypercholesteremia 11/02/2007    Past Medical History:  Diagnosis Date  . Arthritis   . Dysrhythmia   . HOH (hard of hearing)    AIDS  . Hyperlipidemia   . Hypertension   . Pain    RIGHT KNEE  . Skin cancer of forehead     Social History   Social History  . Marital status: Married    Spouse  name: N/A  . Number of children: N/A  . Years of education: N/A   Occupational History  . Not on file.   Social History Main Topics  . Smoking status: Never Smoker  . Smokeless tobacco: Never Used  . Alcohol use No  . Drug use: No  . Sexual activity: No   Other Topics Concern  . Not on file   Social History Narrative  . No narrative on file    Allergies  Allergen Reactions  . Cardizem [Diltiazem] Rash  . Diltiazem Hcl Rash    Review of Systems  Constitutional: Negative.   HENT: Negative.   Eyes: Negative.   Respiratory: Positive for cough.   Cardiovascular: Negative.   Gastrointestinal: Positive for abdominal pain.  Musculoskeletal: Positive for back pain.  Skin: Negative.   Neurological: Negative.   Endo/Heme/Allergies: Negative.   Psychiatric/Behavioral: Negative.     Immunization History  Administered Date(s) Administered  . Influenza, High Dose Seasonal PF 06/10/2015, 06/14/2016  . Pneumococcal Conjugate-13 01/16/2015  . Pneumococcal Polysaccharide-23 08/27/2004  . Tdap 05/27/2011  . Zoster 07/06/2013    Objective:  BP (!) 158/88 (BP Location: Left Arm, Patient Position: Sitting, Cuff Size: Normal)   Pulse 98   Temp (!) 97.4 F (36.3 C) (Oral)   Resp 16  Wt 123 lb (55.8 kg)   BMI 21.79 kg/m   Physical Exam  Constitutional: She is oriented to person, place, and time and well-developed, well-nourished, and in no distress.  Neck: Normal range of motion. Neck supple.  Abdominal: There is tenderness (tender over lower lateral ribs).  Neurological: She is alert and oriented to person, place, and time. She has normal reflexes. Gait normal. GCS score is 15.  Skin: Skin is warm and dry.  Psychiatric: Mood, memory, affect and judgment normal.    Lab Results  Component Value Date   WBC 5.2 02/17/2017   HGB 12.0 02/17/2017   HCT 37.7 02/17/2017   PLT 245 02/17/2017   GLUCOSE 98 04/21/2017   CHOL 191 02/17/2017   TRIG 94 02/17/2017   HDL 66  02/17/2017   LDLCALC 106 (H) 02/17/2017   TSH 3.220 02/17/2017    CMP     Component Value Date/Time   NA 139 04/21/2017 0935   NA 143 02/12/2012 0513   K 3.8 04/21/2017 0935   K 4.8 02/12/2012 0513   CL 98 04/21/2017 0935   CL 112 (H) 02/12/2012 0513   CO2 28 04/21/2017 0935   CO2 26 02/12/2012 0513   GLUCOSE 98 04/21/2017 0935   GLUCOSE 95 02/12/2012 0513   BUN 19 04/21/2017 0935   BUN 11 02/12/2012 0513   CREATININE 0.89 04/21/2017 0935   CREATININE 0.87 02/12/2012 0513   CALCIUM 9.7 04/21/2017 0935   CALCIUM 7.2 (L) 02/12/2012 0513   PROT 7.8 02/17/2017 1136   PROT 5.8 (L) 02/12/2012 0513   ALBUMIN 4.2 04/21/2017 0935   ALBUMIN 2.0 (L) 02/12/2012 0513   AST 28 02/17/2017 1136   AST 49 (H) 02/12/2012 0513   ALT 12 02/17/2017 1136   ALT 22 02/12/2012 0513   ALKPHOS 68 02/17/2017 1136   ALKPHOS 45 (L) 02/12/2012 0513   BILITOT 0.5 02/17/2017 1136   BILITOT 0.3 02/12/2012 0513   GFRNONAA 62 04/21/2017 0935   GFRNONAA >60 02/12/2012 0513   GFRAA 72 04/21/2017 0935   GFRAA >60 02/12/2012 0513    Assessment and Plan :  1. Acute left-sided back pain, unspecified back location Likely MSK,possible sciatica. - POCT urinalysis dipstick  2. Pleurisy  - meloxicam (MOBIC) 7.5 MG tablet; Take 1 tablet (7.5 mg total) by mouth daily.  Dispense: 30 tablet; Refill: 0 - traMADol (ULTRAM) 50 MG tablet; Take 1 tablet (50 mg total) by mouth every 4 (four) hours. For moderate pain  Dispense: 35 tablet; Refill: 0   HPI, Exam, and A&P Transcribed under the direction and in the presence of Richard L. Cranford Mon, MD  Electronically Signed: Katina Dung, [place] MD [facility] 05/12/2017 2:21 PM

## 2017-05-13 MED ORDER — MELOXICAM 7.5 MG PO TABS: 7.5000 mg | ORAL_TABLET | Freq: Every day | ORAL | 0 refills | Status: DC

## 2017-05-14 LAB — URINE CULTURE: ORGANISM ID, BACTERIA: NO GROWTH

## 2017-05-16 ENCOUNTER — Ambulatory Visit
Admission: RE | Admit: 2017-05-16 | Discharge: 2017-05-16 | Disposition: A | Discharge status: Home / Self Care | Payer: Medicare Other | Source: Ambulatory Visit | Attending: Family Medicine | Admitting: Family Medicine

## 2017-05-16 DIAGNOSIS — M818 Other osteoporosis without current pathological fracture: Secondary | ICD-10-CM

## 2017-05-16 DIAGNOSIS — M8589 Other specified disorders of bone density and structure, multiple sites: Secondary | ICD-10-CM | POA: Diagnosis not present

## 2017-05-16 DIAGNOSIS — Z780 Asymptomatic menopausal state: Secondary | ICD-10-CM | POA: Diagnosis not present

## 2017-06-13 DIAGNOSIS — D0439 Carcinoma in situ of skin of other parts of face: Secondary | ICD-10-CM | POA: Diagnosis not present

## 2017-07-01 ENCOUNTER — Ambulatory Visit (INDEPENDENT_AMBULATORY_CARE_PROVIDER_SITE_OTHER): Payer: Medicare Other | Admitting: Family Medicine

## 2017-07-01 VITALS — BP 158/92 | HR 82 | Temp 97.7°F | Resp 12 | Wt 119.0 lb

## 2017-07-01 DIAGNOSIS — I10 Essential (primary) hypertension: Secondary | ICD-10-CM | POA: Diagnosis not present

## 2017-07-01 DIAGNOSIS — Z23 Encounter for immunization: Secondary | ICD-10-CM

## 2017-07-01 DIAGNOSIS — E7800 Pure hypercholesterolemia, unspecified: Secondary | ICD-10-CM | POA: Diagnosis not present

## 2017-07-01 DIAGNOSIS — M818 Other osteoporosis without current pathological fracture: Secondary | ICD-10-CM

## 2017-07-01 MED ORDER — HYDROCHLOROTHIAZIDE 25 MG PO TABS: 25.0000 mg | ORAL_TABLET | Freq: Every day | ORAL | 3 refills | Status: DC

## 2017-07-01 NOTE — Progress Notes (Signed)
Morgan Ponce  MRN: [id number] DOB: 12-Oct-1940  Subjective:  HPI  Patient is here for follow up. Last office visit for routine check up was on 04/21/17. For blood pressure at that time patient was advised to stop HCTZ and continue Losartan. Patient has been checking her b/p and readings have been higher since stopping HCTZ, readings have been 130s-150s/70s-80s. Before stopping HCTZ readings were getting too low at times under 100/60s. Patient was not having any dizziness at that time, she felt ok.  BP Readings from Last 3 Encounters:  07/01/17 (!) 158/92  05/12/17 (!) 158/88  05/06/17 (!) 164/71   Patient Active Problem List   Diagnosis Date Noted  . Hypertension 03/21/2016  . History of anemia 08/21/2015  . OP (osteoporosis) 08/21/2015  . Herpes zona 08/21/2015  . Breast calcifications on mammogram 08/16/2014  . Flutter-fibrillation 02/09/2010  . Avitaminosis D 12/12/2008  . Menopausal and postmenopausal disorder 09/13/2008  . BP (high blood pressure) 11/02/2007  . Hypercholesteremia 11/02/2007    Past Medical History:  Diagnosis Date  . Arthritis   . Dysrhythmia   . HOH (hard of hearing)    AIDS  . Hyperlipidemia   . Hypertension   . Pain    RIGHT KNEE  . Skin cancer of forehead     Social History   Social History  . Marital status: Married    Spouse name: N/A  . Number of children: N/A  . Years of education: N/A   Occupational History  . Not on file.   Social History Main Topics  . Smoking status: Never Smoker  . Smokeless tobacco: Never Used  . Alcohol use No  . Drug use: No  . Sexual activity: No   Other Topics Concern  . Not on file   Social History Narrative  . No narrative on file    Outpatient Encounter Prescriptions as of 07/01/2017  Medication Sig  . aspirin 81 MG tablet Take 81 mg by mouth daily.  . Glycerin-Polysorbate 80 (REFRESH DRY EYE THERAPY OP) Apply to eye.  . losartan (COZAAR) 50 MG tablet Take 1 tablet (50 mg total) by  mouth daily.  Marland Kitchen lovastatin (MEVACOR) 10 MG tablet TAKE 1 TABLET BY MOUTH ONCE A DAY  . Vitamin D, Cholecalciferol, 1000 UNITS TABS Take 1 tablet by mouth daily.  . [DISCONTINUED] meloxicam (MOBIC) 7.5 MG tablet Take 1 tablet (7.5 mg total) by mouth daily.  . [DISCONTINUED] traMADol (ULTRAM) 50 MG tablet Take 1 tablet (50 mg total) by mouth every 4 (four) hours. For moderate pain   No facility-administered encounter medications on file as of 07/01/2017.     Allergies  Allergen Reactions  . Cardizem [Diltiazem] Rash  . Diltiazem Hcl Rash    Review of Systems  Constitutional: Negative.   Respiratory: Negative.   Cardiovascular: Negative.   Musculoskeletal: Negative.   Neurological: Negative.   Endo/Heme/Allergies: Negative.   Psychiatric/Behavioral: Negative.     Objective:  BP (!) 158/92   Pulse 82   Temp 97.7 F (36.5 C)   Resp 12   Wt 119 lb (54 kg)   BMI 21.08 kg/m   Physical Exam  Constitutional: She is oriented to person, place, and time and well-developed, well-nourished, and in no distress.  HENT:  Head: Normocephalic and atraumatic.  Eyes: Pupils are equal, round, and reactive to light. Conjunctivae are normal.  Neck: Normal range of motion. Neck supple.  Cardiovascular: Normal rate, regular rhythm, normal heart sounds and intact distal pulses.  Exam  reveals no gallop.   No murmur heard. Pulmonary/Chest: Effort normal. No respiratory distress. She has no wheezes.  Abdominal: Soft.  Musculoskeletal: She exhibits no edema or tenderness.  Neurological: She is alert and oriented to person, place, and time.  Skin: Skin is warm and dry.  Psychiatric: Mood, memory, affect and judgment normal.   Assessment and Plan :  1. Essential hypertension B/p worse since stopping HCTZ. Patient advised t ore start HCTZ and continue Losartan. Patient tolerated been on both medications before. Will re check on the next visit. 2. Other osteoporosis without current pathological  fracture Up to date with recent BMD. Now osteopenic.  3. Hypercholesteremia 4. Need for influenza vaccination - Flu vaccine HIGH DOSE PF (Fluzone High dose)  HPI, Exam and A&P transcribed by Tiffany Kocher, RMA under direction and in the presence of Miguel Aschoff, MD. I have done the exam and reviewed the chart and it is accurate to the best of my knowledge. Development worker, community has been used and  any errors in dictation or transcription are unintentional. Miguel Aschoff M.D. [facility]

## 2017-07-01 NOTE — Patient Instructions (Signed)
Re start Hydrochlorothiazide. Continue taking Losartan and we will follow up on the next visit.

## 2017-07-17 DIAGNOSIS — L905 Scar conditions and fibrosis of skin: Secondary | ICD-10-CM | POA: Diagnosis not present

## 2017-07-17 DIAGNOSIS — D0439 Carcinoma in situ of skin of other parts of face: Secondary | ICD-10-CM | POA: Diagnosis not present

## 2017-08-07 DIAGNOSIS — H2512 Age-related nuclear cataract, left eye: Secondary | ICD-10-CM | POA: Diagnosis not present

## 2017-08-11 ENCOUNTER — Other Ambulatory Visit: Payer: Self-pay | Admitting: Family Medicine

## 2017-08-11 DIAGNOSIS — Z1231 Encounter for screening mammogram for malignant neoplasm of breast: Secondary | ICD-10-CM

## 2017-08-14 ENCOUNTER — Encounter: Payer: Self-pay | Admitting: *Deleted

## 2017-08-19 ENCOUNTER — Ambulatory Visit: Admission: RE | Admit: 2017-08-19 | Payer: Medicare Other | Source: Ambulatory Visit | Admitting: Ophthalmology

## 2017-08-19 ENCOUNTER — Encounter: Admission: RE | Payer: Self-pay | Source: Ambulatory Visit

## 2017-08-19 SURGERY — PHACOEMULSIFICATION, CATARACT, WITH IOL INSERTION
Anesthesia: Choice | Laterality: Left

## 2017-09-02 ENCOUNTER — Ambulatory Visit
Admission: RE | Admit: 2017-09-02 | Discharge: 2017-09-02 | Disposition: A | Discharge status: Home / Self Care | Payer: Medicare Other | Source: Ambulatory Visit | Attending: Ophthalmology | Admitting: Ophthalmology

## 2017-09-02 ENCOUNTER — Ambulatory Visit: Payer: Medicare Other | Admitting: Certified Registered Nurse Anesthetist

## 2017-09-02 ENCOUNTER — Ambulatory Visit: Payer: Self-pay

## 2017-09-02 ENCOUNTER — Encounter
Admission: RE | Disposition: A | Discharge status: Home / Self Care | Payer: Self-pay | Source: Ambulatory Visit | Attending: Ophthalmology

## 2017-09-02 ENCOUNTER — Encounter: Payer: Self-pay | Admitting: Family Medicine

## 2017-09-02 ENCOUNTER — Encounter: Payer: Self-pay | Admitting: Emergency Medicine

## 2017-09-02 DIAGNOSIS — I10 Essential (primary) hypertension: Secondary | ICD-10-CM | POA: Diagnosis not present

## 2017-09-02 DIAGNOSIS — Z21 Asymptomatic human immunodeficiency virus [HIV] infection status: Secondary | ICD-10-CM | POA: Insufficient documentation

## 2017-09-02 DIAGNOSIS — I4891 Unspecified atrial fibrillation: Secondary | ICD-10-CM | POA: Diagnosis not present

## 2017-09-02 DIAGNOSIS — E7800 Pure hypercholesterolemia, unspecified: Secondary | ICD-10-CM | POA: Diagnosis not present

## 2017-09-02 DIAGNOSIS — H2512 Age-related nuclear cataract, left eye: Secondary | ICD-10-CM | POA: Insufficient documentation

## 2017-09-02 DIAGNOSIS — Z7982 Long term (current) use of aspirin: Secondary | ICD-10-CM | POA: Diagnosis not present

## 2017-09-02 DIAGNOSIS — Z888 Allergy status to other drugs, medicaments and biological substances status: Secondary | ICD-10-CM | POA: Insufficient documentation

## 2017-09-02 DIAGNOSIS — M1990 Unspecified osteoarthritis, unspecified site: Secondary | ICD-10-CM | POA: Diagnosis not present

## 2017-09-02 DIAGNOSIS — M810 Age-related osteoporosis without current pathological fracture: Secondary | ICD-10-CM | POA: Insufficient documentation

## 2017-09-02 DIAGNOSIS — Z79899 Other long term (current) drug therapy: Secondary | ICD-10-CM | POA: Insufficient documentation

## 2017-09-02 HISTORY — PX: CATARACT EXTRACTION W/PHACO: SHX586

## 2017-09-02 SURGERY — PHACOEMULSIFICATION, CATARACT, WITH IOL INSERTION
Anesthesia: Monitor Anesthesia Care | Site: Eye | Laterality: Left | Wound class: Clean

## 2017-09-02 MED ORDER — FENTANYL CITRATE (PF) 100 MCG/2ML IJ SOLN
INTRAMUSCULAR | Status: DC | PRN
  Administered 2017-09-02 (×2): 25 ug via INTRAVENOUS

## 2017-09-02 MED ORDER — NA CHONDROIT SULF-NA HYALURON 40-17 MG/ML IO SOLN
INTRAOCULAR | Status: DC | PRN
  Administered 2017-09-02: 1 mL via INTRAOCULAR

## 2017-09-02 MED ORDER — CARBACHOL 0.01 % IO SOLN
INTRAOCULAR | Status: DC | PRN
Start: 2017-09-02 — End: 2017-09-02
  Administered 2017-09-02: .5 mL via INTRAOCULAR

## 2017-09-02 MED ORDER — MIDAZOLAM HCL 2 MG/2ML IJ SOLN
INTRAMUSCULAR | Status: DC | PRN
  Administered 2017-09-02: 1 mg via INTRAVENOUS

## 2017-09-02 MED ORDER — ARMC OPHTHALMIC DILATING DROPS
1.0000 "application " | OPHTHALMIC | Status: AC
  Administered 2017-09-02 (×3): 1 via OPHTHALMIC

## 2017-09-02 MED ORDER — MIDAZOLAM HCL 2 MG/2ML IJ SOLN
INTRAMUSCULAR | Status: AC
  Filled 2017-09-02: qty 2

## 2017-09-02 MED ORDER — LIDOCAINE HCL (PF) 4 % IJ SOLN
INTRAMUSCULAR | Status: AC
  Filled 2017-09-02: qty 5

## 2017-09-02 MED ORDER — EPINEPHRINE PF 1 MG/ML IJ SOLN
INTRAMUSCULAR | Status: AC
  Filled 2017-09-02: qty 1

## 2017-09-02 MED ORDER — ARMC OPHTHALMIC DILATING DROPS
OPHTHALMIC | Status: AC
  Administered 2017-09-02: 1 via OPHTHALMIC
  Filled 2017-09-02: qty 0.4

## 2017-09-02 MED ORDER — MOXIFLOXACIN HCL 0.5 % OP SOLN
OPHTHALMIC | Status: AC
  Filled 2017-09-02: qty 3

## 2017-09-02 MED ORDER — FENTANYL CITRATE (PF) 100 MCG/2ML IJ SOLN
INTRAMUSCULAR | Status: AC
  Filled 2017-09-02: qty 2

## 2017-09-02 MED ORDER — POVIDONE-IODINE 5 % OP SOLN
OPHTHALMIC | Status: DC | PRN
  Administered 2017-09-02: 1 via OPHTHALMIC

## 2017-09-02 MED ORDER — SODIUM CHLORIDE 0.9 % IV SOLN
INTRAVENOUS | Status: DC
  Administered 2017-09-02: 12:00:00 via INTRAVENOUS

## 2017-09-02 MED ORDER — MOXIFLOXACIN HCL 0.5 % OP SOLN: 1.0000 [drp] | OPHTHALMIC | Status: DC | PRN

## 2017-09-02 MED ORDER — MOXIFLOXACIN HCL 0.5 % OP SOLN
OPHTHALMIC | Status: DC | PRN
  Administered 2017-09-02: .2 mL via OPHTHALMIC

## 2017-09-02 MED ORDER — LIDOCAINE HCL (PF) 4 % IJ SOLN
INTRAMUSCULAR | Status: DC | PRN
  Administered 2017-09-02: 2 mL via OPHTHALMIC

## 2017-09-02 MED ORDER — EPINEPHRINE PF 1 MG/ML IJ SOLN
INTRAOCULAR | Status: DC | PRN
  Administered 2017-09-02: 1 mL via OPHTHALMIC

## 2017-09-02 MED ORDER — NA CHONDROIT SULF-NA HYALURON 40-17 MG/ML IO SOLN
INTRAOCULAR | Status: AC
  Filled 2017-09-02: qty 1

## 2017-09-02 MED ORDER — POVIDONE-IODINE 5 % OP SOLN
OPHTHALMIC | Status: AC
  Filled 2017-09-02: qty 30

## 2017-09-02 SURGICAL SUPPLY — 16 items
GLOVE BIO SURGEON STRL SZ8 (GLOVE) ×2 IMPLANT
GLOVE BIOGEL M 6.5 STRL (GLOVE) ×2 IMPLANT
GLOVE SURG LX 8.0 MICRO (GLOVE) ×1
GLOVE SURG LX STRL 8.0 MICRO (GLOVE) ×1 IMPLANT
GOWN STRL REUS W/ TWL LRG LVL3 (GOWN DISPOSABLE) ×2 IMPLANT
GOWN STRL REUS W/TWL LRG LVL3 (GOWN DISPOSABLE) ×2
LABEL CATARACT MEDS ST (LABEL) ×2 IMPLANT
LENS IOL TECNIS ITEC 15.5 (Intraocular Lens) ×2 IMPLANT
PACK CATARACT (MISCELLANEOUS) ×2 IMPLANT
PACK CATARACT BRASINGTON LX (MISCELLANEOUS) ×2 IMPLANT
PACK EYE AFTER SURG (MISCELLANEOUS) ×2 IMPLANT
SOL BSS BAG (MISCELLANEOUS) ×2
SOLUTION BSS BAG (MISCELLANEOUS) ×1 IMPLANT
SYR 5ML LL (SYRINGE) ×2 IMPLANT
WATER STERILE IRR 250ML POUR (IV SOLUTION) ×2 IMPLANT
WIPE NON LINTING 3.25X3.25 (MISCELLANEOUS) ×2 IMPLANT

## 2017-09-02 NOTE — Anesthesia Postprocedure Evaluation (Signed)
Anesthesia Post Note  Patient: Ziyana P Brayman  Procedure(s) Performed: CATARACT EXTRACTION PHACO AND INTRAOCULAR LENS PLACEMENT ([place]) (Left Eye)  Patient location during evaluation: PACU Anesthesia Type: MAC Level of consciousness: awake, awake and alert and oriented Pain management: pain level controlled Vital Signs Assessment: post-procedure vital signs reviewed and stable Respiratory status: spontaneous breathing Cardiovascular status: blood pressure returned to baseline Postop Assessment: no headache, no backache, no apparent nausea or vomiting and adequate PO intake Anesthetic complications: no     Last Vitals:  Vitals:   09/02/17 1126 09/02/17 1259  BP: (!) 144/63 (!) 117/51  Pulse: 77 (!) 58  Resp: 17 12  Temp: (!) 36.4 C 36.8 C  SpO2: 100% 100%    Last Pain:  Vitals:   09/02/17 1259  TempSrc: Oral                 Hedda Slade

## 2017-09-02 NOTE — Op Note (Signed)
PREOPERATIVE DIAGNOSIS:  Nuclear sclerotic cataract of the left eye.   POSTOPERATIVE DIAGNOSIS:  Nuclear sclerotic cataract of the left eye.   OPERATIVE PROCEDURE: Procedure(s): CATARACT EXTRACTION PHACO AND INTRAOCULAR LENS PLACEMENT (IOC)   SURGEON:  Birder Robson, MD.   ANESTHESIA:  Anesthesiologist: Alvin Critchley, MD CRNA: Hedda Slade, CRNA  1.      Managed anesthesia care. 2.     0.47ml of Shugarcaine was instilled following the paracentesis   COMPLICATIONS:  None.   TECHNIQUE:   Stop and chop   DESCRIPTION OF PROCEDURE:  The patient was examined and consented in the preoperative holding area where the aforementioned topical anesthesia was applied to the left eye and then brought back to the Operating Room where the left eye was prepped and draped in the usual sterile ophthalmic fashion and a lid speculum was placed. A paracentesis was created with the side port blade and the anterior chamber was filled with viscoelastic. A near clear corneal incision was performed with the steel keratome. A continuous curvilinear capsulorrhexis was performed with a cystotome followed by the capsulorrhexis forceps. Hydrodissection and hydrodelineation were carried out with BSS on a blunt cannula. The lens was removed in a stop and chop  technique and the remaining cortical material was removed with the irrigation-aspiration handpiece. The capsular bag was inflated with viscoelastic and the Technis [id number] lens was placed in the capsular bag without complication. The remaining viscoelastic was removed from the eye with the irrigation-aspiration handpiece. The wounds were hydrated. The anterior chamber was flushed with Miostat and the eye was inflated to physiologic pressure. 0.6ml Vigamox was placed in the anterior chamber. The wounds were found to be water tight. The eye was dressed with Vigamox. The patient was given protective glasses to wear throughout the day and a shield with which to sleep tonight.  The patient was also given drops with which to begin a drop regimen today and will follow-up with me in one day. Implant Name Type Inv. Item Serial No. Manufacturer Lot No. LRB No. Used  LENS IOL DIOP 15.5 - [id number] Intraocular Lens LENS IOL DIOP 15.5 [id number] AMO  Left 1    Procedure(s) with comments: CATARACT EXTRACTION PHACO AND INTRAOCULAR LENS PLACEMENT (IOC) (Left) - [place] 00:39 AP% 15.0 CDE 5.91 Fluid pack lot # [id number]  Electronically signed: [place] 09/02/2017 12:56 PM

## 2017-09-02 NOTE — Discharge Instructions (Signed)
Eye Surgery Discharge Instructions  Expect mild scratchy sensation or mild soreness. DO NOT RUB YOUR EYE!  The day of surgery:  Minimal physical activity, but bed rest is not required  No reading, computer work, or close hand work  No bending, lifting, or straining.  May watch TV  For 24 hours:  No driving, legal decisions, or alcoholic beverages  Safety precautions  Eat anything you prefer: It is better to start with liquids, then soup then solid foods.  _____ Eye patch should be worn until postoperative exam tomorrow.  ____ Solar shield eyeglasses should be worn for comfort in the sunlight/patch while sleeping  Resume all regular medications including aspirin or Coumadin if these were discontinued prior to surgery. You may shower, bathe, shave, or wash your hair. Tylenol may be taken for mild discomfort.  Call your doctor if you experience significant pain, nausea, or vomiting, fever > 101 or other signs of infection. [phone] or [phone] Specific instructions:  Follow-up Information    Birder Robson, MD Follow up on 09/03/2017.   Specialty:  Ophthalmology Why:  11:00 Contact information: [address] [place] [address] [phone]

## 2017-09-02 NOTE — Transfer of Care (Signed)
Immediate Anesthesia Transfer of Care Note  Patient: Morgan Ponce  Procedure(s) Performed: CATARACT EXTRACTION PHACO AND INTRAOCULAR LENS PLACEMENT (IOC) (Left Eye)  Patient Location: PACU  Anesthesia Type:MAC  Level of Consciousness: awake, alert  and oriented  Airway & Oxygen Therapy: Patient Spontanous Breathing  Post-op Assessment: Report given to RN and Post -op Vital signs reviewed and stable  Post vital signs: Reviewed and stable  Last Vitals:  Vitals:   09/02/17 1126 09/02/17 1259  BP: (!) 144/63 (!) 117/51  Pulse: 77 (!) 58  Resp: 17 12  Temp: (!) 36.4 C 36.8 C  SpO2: 100% 100%    Last Pain:  Vitals:   09/02/17 1259  TempSrc: Oral         Complications: No apparent anesthesia complications

## 2017-09-02 NOTE — Anesthesia Preprocedure Evaluation (Signed)
Anesthesia Evaluation  Patient identified by MRN, date of birth, ID band Patient awake    Reviewed: Allergy & Precautions, H&P , NPO status , Patient's Chart, lab work & pertinent test results, reviewed documented beta blocker date and time   History of Anesthesia Complications Negative for: history of anesthetic complications  Airway Mallampati: II  TM Distance: >3 FB Neck ROM: full    Dental no notable dental hx.    Pulmonary neg pulmonary ROS,    Pulmonary exam normal breath sounds clear to auscultation       Cardiovascular Exercise Tolerance: Good hypertension, negative cardio ROS  + dysrhythmias Atrial Fibrillation  Rhythm:regular Rate:Normal     Neuro/Psych negative neurological ROS  negative psych ROS   GI/Hepatic negative GI ROS, Neg liver ROS,   Endo/Other  negative endocrine ROS  Renal/GU negative Renal ROS  negative genitourinary   Musculoskeletal  (+) Arthritis ,   Abdominal   Peds  Hematology negative hematology ROS (+)   Anesthesia Other Findings Past Medical History: No date: Arthritis No date: Dysrhythmia No date: HOH (hard of hearing)     Comment:  AIDS No date: Hyperlipidemia No date: Hypertension No date: Pain     Comment:  RIGHT KNEE No date: Skin cancer of forehead   Reproductive/Obstetrics negative OB ROS                             Anesthesia Physical  Anesthesia Plan  ASA: III  Anesthesia Plan: MAC   Post-op Pain Management:    Induction: Intravenous  PONV Risk Score and Plan:   Airway Management Planned: Natural Airway and Nasal Cannula  Additional Equipment:   Intra-op Plan:   Post-operative Plan:   Informed Consent: I have reviewed the patients History and Physical, chart, labs and discussed the procedure including the risks, benefits and alternatives for the proposed anesthesia with the patient or authorized representative who has  indicated his/her understanding and acceptance.   Dental Advisory Given  Plan Discussed with: CRNA  Anesthesia Plan Comments: (Patient consented for risks of anesthesia including but not limited to:  - adverse reactions to medications - damage to teeth, lips or other oral mucosa - sore throat or hoarseness - Damage to heart, brain, lungs or loss of life  Patient voiced understanding.)        Anesthesia Quick Evaluation

## 2017-09-02 NOTE — H&P (Signed)
All labs reviewed. Abnormal studies sent to patients PCP when indicated.  Previous H&P reviewed, patient examined, there are NO CHANGES.  Morgan Ponce LOUIS12/4/201812:31 PM

## 2017-09-02 NOTE — Anesthesia Post-op Follow-up Note (Signed)
Anesthesia QCDR form completed.        

## 2017-09-15 ENCOUNTER — Ambulatory Visit (INDEPENDENT_AMBULATORY_CARE_PROVIDER_SITE_OTHER): Payer: Medicare Other

## 2017-09-15 VITALS — BP 128/72 | HR 80 | Temp 97.5°F | Ht 63.0 in | Wt 114.6 lb

## 2017-09-15 DIAGNOSIS — Z0000 Encounter for general adult medical examination without abnormal findings: Secondary | ICD-10-CM | POA: Diagnosis not present

## 2017-09-15 NOTE — Patient Instructions (Signed)
Morgan Ponce , Thank you for taking time to come for your Medicare Wellness Visit. I appreciate your ongoing commitment to your health goals. Please review the following plan we discussed and let me know if I can assist you in the future.   Screening recommendations/referrals: Colonoscopy: up to date Mammogram: up to date Bone Density: up to date Recommended yearly ophthalmology/optometry visit for glaucoma screening and checkup Recommended yearly dental visit for hygiene and checkup  Vaccinations: Influenza vaccine: up to date Pneumococcal vaccine: up to date Tdap vaccine: up to date Shingles vaccine: declined  Advanced directives: Please bring a copy of your POA (Power of Attorney) and/or Living Will to your next appointment.   Conditions/risks identified: Recommend increasing water intake to 4-6 glasses a day.   Next appointment: 10/16/17 @ 9:30 AM   Preventive Care 78 Years and Older, Female Preventive care refers to lifestyle choices and visits with your health care provider that can promote health and wellness. What does preventive care include?  A yearly physical exam. This is also called an annual well check.  Dental exams once or twice a year.  Routine eye exams. Ask your health care provider how often you should have your eyes checked.  Personal lifestyle choices, including:  Daily care of your teeth and gums.  Regular physical activity.  Eating a healthy diet.  Avoiding tobacco and drug use.  Limiting alcohol use.  Practicing safe sex.  Taking low-dose aspirin every day.  Taking vitamin and mineral supplements as recommended by your health care provider. What happens during an annual well check? The services and screenings done by your health care provider during your annual well check will depend on your age, overall health, lifestyle risk factors, and family history of disease. Counseling  Your health care provider may ask you questions about  your:  Alcohol use.  Tobacco use.  Drug use.  Emotional well-being.  Home and relationship well-being.  Sexual activity.  Eating habits.  History of falls.  Memory and ability to understand (cognition).  Work and work Statistician.  Reproductive health. Screening  You may have the following tests or measurements:  Height, weight, and BMI.  Blood pressure.  Lipid and cholesterol levels. These may be checked every 5 years, or more frequently if you are over 72 years old.  Skin check.  Lung cancer screening. You may have this screening every year starting at age 78 if you have a 30-pack-year history of smoking and currently smoke or have quit within the past 15 years.  Fecal occult blood test (FOBT) of the stool. You may have this test every year starting at age 78.  Flexible sigmoidoscopy or colonoscopy. You may have a sigmoidoscopy every 5 years or a colonoscopy every 10 years starting at age 78.  Hepatitis C blood test.  Hepatitis B blood test.  Sexually transmitted disease (STD) testing.  Diabetes screening. This is done by checking your blood sugar (glucose) after you have not eaten for a while (fasting). You may have this done every 1-3 years.  Bone density scan. This is done to screen for osteoporosis. You may have this done starting at age 55.  Mammogram. This may be done every 1-2 years. Talk to your health care provider about how often you should have regular mammograms. Talk with your health care provider about your test results, treatment options, and if necessary, the need for more tests. Vaccines  Your health care provider may recommend certain vaccines, such as:  Influenza vaccine. This  is recommended every year.  Tetanus, diphtheria, and acellular pertussis (Tdap, Td) vaccine. You may need a Td booster every 10 years.  Zoster vaccine. You may need this after age 78.  Pneumococcal 13-valent conjugate (PCV13) vaccine. One dose is recommended  after age 27.  Pneumococcal polysaccharide (PPSV23) vaccine. One dose is recommended after age 67. Talk to your health care provider about which screenings and vaccines you need and how often you need them. This information is not intended to replace advice given to you by your health care provider. Make sure you discuss any questions you have with your health care provider. Document Released: 10/13/2015 Document Revised: 06/05/2016 Document Reviewed: 07/18/2015 Elsevier Interactive Patient Education  2017 [place] Prevention in the Home Falls can cause injuries. They can happen to people of all ages. There are many things you can do to make your home safe and to help prevent falls. What can I do on the outside of my home?  Regularly fix the edges of walkways and driveways and fix any cracks.  Remove anything that might make you trip as you walk through a door, such as a raised step or threshold.  Trim any bushes or trees on the path to your home.  Use bright outdoor lighting.  Clear any walking paths of anything that might make someone trip, such as rocks or tools.  Regularly check to see if handrails are loose or broken. Make sure that both sides of any steps have handrails.  Any raised decks and porches should have guardrails on the edges.  Have any leaves, snow, or ice cleared regularly.  Use sand or salt on walking paths during winter.  Clean up any spills in your garage right away. This includes oil or grease spills. What can I do in the bathroom?  Use night lights.  Install grab bars by the toilet and in the tub and shower. Do not use towel bars as grab bars.  Use non-skid mats or decals in the tub or shower.  If you need to sit down in the shower, use a plastic, non-slip stool.  Keep the floor dry. Clean up any water that spills on the floor as soon as it happens.  Remove soap buildup in the tub or shower regularly.  Attach bath mats securely with  double-sided non-slip rug tape.  Do not have throw rugs and other things on the floor that can make you trip. What can I do in the bedroom?  Use night lights.  Make sure that you have a light by your bed that is easy to reach.  Do not use any sheets or blankets that are too big for your bed. They should not hang down onto the floor.  Have a firm chair that has side arms. You can use this for support while you get dressed.  Do not have throw rugs and other things on the floor that can make you trip. What can I do in the kitchen?  Clean up any spills right away.  Avoid walking on wet floors.  Keep items that you use a lot in easy-to-reach places.  If you need to reach something above you, use a strong step stool that has a grab bar.  Keep electrical cords out of the way.  Do not use floor polish or wax that makes floors slippery. If you must use wax, use non-skid floor wax.  Do not have throw rugs and other things on the floor that can make you  trip. What can I do with my stairs?  Do not leave any items on the stairs.  Make sure that there are handrails on both sides of the stairs and use them. Fix handrails that are broken or loose. Make sure that handrails are as long as the stairways.  Check any carpeting to make sure that it is firmly attached to the stairs. Fix any carpet that is loose or worn.  Avoid having throw rugs at the top or bottom of the stairs. If you do have throw rugs, attach them to the floor with carpet tape.  Make sure that you have a light switch at the top of the stairs and the bottom of the stairs. If you do not have them, ask someone to add them for you. What else can I do to help prevent falls?  Wear shoes that:  Do not have high heels.  Have rubber bottoms.  Are comfortable and fit you well.  Are closed at the toe. Do not wear sandals.  If you use a stepladder:  Make sure that it is fully opened. Do not climb a closed stepladder.  Make  sure that both sides of the stepladder are locked into place.  Ask someone to hold it for you, if possible.  Clearly mark and make sure that you can see:  Any grab bars or handrails.  First and last steps.  Where the edge of each step is.  Use tools that help you move around (mobility aids) if they are needed. These include:  Canes.  Walkers.  Scooters.  Crutches.  Turn on the lights when you go into a dark area. Replace any light bulbs as soon as they burn out.  Set up your furniture so you have a clear path. Avoid moving your furniture around.  If any of your floors are uneven, fix them.  If there are any pets around you, be aware of where they are.  Review your medicines with your doctor. Some medicines can make you feel dizzy. This can increase your chance of falling. Ask your doctor what other things that you can do to help prevent falls. This information is not intended to replace advice given to you by your health care provider. Make sure you discuss any questions you have with your health care provider. Document Released: 07/13/2009 Document Revised: 02/22/2016 Document Reviewed: 10/21/2014 Elsevier Interactive Patient Education  2017 [employer].

## 2017-09-15 NOTE — Progress Notes (Signed)
Subjective:   Morgan Ponce is a 76 y.o. female who presents for Medicare Annual (Subsequent) preventive examination.  Review of Systems:  N/A  Cardiac Risk Factors include: advanced age (>26men, >28 women);dyslipidemia;hypertension     Objective:     Vitals: BP 128/72 (BP Location: Left Arm)   Pulse 80   Temp (!) 97.5 F (36.4 C) (Oral)   Ht 5\' 3"  (1.6 m)   Wt 114 lb 9.6 oz (52 kg)   BMI 20.30 kg/m   Body mass index is 20.3 kg/m.  Advanced Directives 09/15/2017 09/02/2017 05/06/2017 04/21/2017 08/27/2016 02/06/2015  Does Patient Have a Medical Advance Directive? Yes Yes Yes Yes Yes Yes  Type of Paramedic of [place];Living will [place];Living will [place];Living will Living will;Healthcare Power of [place] will  Does patient want to make changes to medical advance directive? - - No - Patient declined - - -  Copy of [place] in Chart? No - copy requested No - copy requested No - copy requested - - No - copy requested    Tobacco Social History   Tobacco Use  Smoking Status Never Smoker  Smokeless Tobacco Never Used     Counseling given: Not Answered   Clinical Intake:  Pre-visit preparation completed: Yes  Pain : No/denies pain Pain Score: 0-No pain     Nutritional Status: BMI of 19-24  Normal Nutritional Risks: None Diabetes: No  How often do you need to have someone help you when you read instructions, pamphlets, or other written materials from your doctor or pharmacy?: 1 - Never  Interpreter Needed?: No  Information entered by :: [facility], LPN  Past Medical History:  Diagnosis Date  . Arthritis   . Dysrhythmia   . HOH (hard of hearing)    AIDS  . Hyperlipidemia   . Hypertension    RESTARTED BP MED SINCE FIRST CATARACT  . Pain    RIGHT KNEE  . Skin cancer of forehead    Past Surgical History:  Procedure Laterality Date  . BREAST BIOPSY Right  07/2014   benign. Done by Dr Collene Schlichter  . BREAST EXCISIONAL BIOPSY Right 1972   benign  . CATARACT EXTRACTION W/PHACO Right 05/06/2017   Procedure: CATARACT EXTRACTION PHACO AND INTRAOCULAR LENS PLACEMENT (IOC);  Surgeon: Birder Robson, MD;  Location: [facility] ORS;  Service: Ophthalmology;  Laterality: Right;  [place] 00:40 AP% 17.6 CDE 7.16 Fluid pack lot # [id number]  . CATARACT EXTRACTION W/PHACO Left 09/02/2017   Procedure: CATARACT EXTRACTION PHACO AND INTRAOCULAR LENS PLACEMENT ([place]);  Surgeon: Birder Robson, MD;  Location: [facility] ORS;  Service: Ophthalmology;  Laterality: Left;  [place] 00:39 AP% 15.0 CDE 5.91 Fluid pack lot # [id number]  . CHOLECYSTECTOMY  2013  . COLONOSCOPY N/A 02/06/2015   Procedure: COLONOSCOPY;  Surgeon: Hulen Luster, MD;  Location[facility] ENDOSCOPY;  Service: Gastroenterology;  Laterality: N/A;  . HYSTEROSCOPY  1999   Family History  Problem Relation Age of Onset  . Hypertension Mother   . Stroke Mother   . Heart disease Father   . Hypertension Father   . Hypertension Sister   . Breast cancer Sister 84  . Heart attack Brother    Social History   Socioeconomic History  . Marital status: Married    Spouse name: None  . Number of children: 0  . Years of education: None  . Highest education level: None  Social Needs  . Financial resource strain:  Not hard at all  . Food insecurity - worry: Never true  . Food insecurity - inability: Never true  . Transportation needs - medical: No  . Transportation needs - non-medical: No  Occupational History  . None  Tobacco Use  . Smoking status: Never Smoker  . Smokeless tobacco: Never Used  Substance and Sexual Activity  . Alcohol use: No    Alcohol/week: 0.0 oz  . Drug use: No  . Sexual activity: No  Other Topics Concern  . None  Social History Narrative  . None    Outpatient Encounter Medications as of 09/15/2017  Medication Sig  . aspirin 81 MG tablet Take 81 mg at bedtime by mouth.   . hydrochlorothiazide  (HYDRODIURIL) 25 MG tablet Take 1 tablet (25 mg total) by mouth daily.  Marland Kitchen losartan (COZAAR) 50 MG tablet Take 1 tablet (50 mg total) by mouth daily.  Marland Kitchen lovastatin (MEVACOR) 10 MG tablet TAKE 1 TABLET BY MOUTH ONCE A DAY (Patient taking differently: TAKE 1 TABLET BY MOUTH AT BEDTIME)  . Polyvinyl Alcohol-Povidone (REFRESH OP) Place 1 drop 5 (five) times daily as needed into both eyes (DRY EYES).  . Vitamin D, Cholecalciferol, 1000 UNITS TABS Take 1 tablet by mouth daily.   No facility-administered encounter medications on file as of 09/15/2017.     Activities of Daily Living In your present state of health, do you have any difficulty performing the following activities: 09/15/2017  Hearing? Y  Comment wears bilateral hearing aids  Vision? Y  Comment currently had cataract sx and is adjusting  Difficulty concentrating or making decisions? N  Walking or climbing stairs? Y  Comment due to knee pain  Dressing or bathing? N  Doing errands, shopping? N  Preparing Food and eating ? N  Using the Toilet? N  In the past six months, have you accidently leaked urine? N  Do you have problems with loss of bowel control? N  Managing your Medications? N  Managing your Finances? N  Housekeeping or managing your Housekeeping? N  Some recent data might be hidden    Patient Care Team: Jerrol Banana., MD as PCP - General (Family Medicine) Jerrol Banana., MD (Family Medicine) Bary Castilla Forest Gleason, MD (General Surgery)    Assessment:   This is a routine wellness examination for Morgan Ponce.  Exercise Activities and Dietary recommendations Current Exercise Habits: The patient does not participate in regular exercise at present, Exercise limited by: None identified  Goals    . DIET - INCREASE WATER INTAKE     Recommend increasing water intake to 4-6 glasses a day.        Fall Risk Fall Risk  09/15/2017 08/27/2016 03/25/2016 08/22/2015  Falls in the past year? No No No No   Is the  patient's home free of loose throw rugs in walkways, pet beds, electrical cords, etc?   yes      Grab bars in the bathroom? no      Handrails on the stairs?   yes      Adequate lighting?   yes  Timed Get Up and Go performed: N/A  Depression Screen PHQ 2/9 Scores 09/15/2017 09/15/2017 08/27/2016 08/22/2015  PHQ - 2 Score 0 0 0 0  PHQ- 9 Score 0 - - -     Cognitive Function: Pt declined screening today.      6CIT Screen 08/27/2016  What Year? 0 points  What month? 0 points  What time? 0 points  Count  back from 20 0 points  Months in reverse 0 points  Repeat phrase 2 points  Total Score 2    Immunization History  Administered Date(s) Administered  . Influenza, High Dose Seasonal PF 06/10/2015, 06/14/2016, 07/01/2017  . Pneumococcal Conjugate-13 01/16/2015  . Pneumococcal Polysaccharide-23 08/27/2004  . Tdap 05/27/2011  . Zoster 07/06/2013    Qualifies for Shingles Vaccine? Pt declined screening today.   Screening Tests Health Maintenance  Topic Date Due  . TETANUS/TDAP  05/26/2021  . INFLUENZA VACCINE  Completed  . DEXA SCAN  Completed  . PNA vac Low Risk Adult  Completed    Cancer Screenings: Lung: Low Dose CT Chest recommended if Age 38-80 years, 30 pack-year currently smoking OR have quit w/in 15years. Patient does not qualify. Breast:  Up to date on Mammogram? Yes   Up to date of Bone Density/Dexa? Yes Colorectal: up to date  Additional Screenings:  Hepatitis B/HIV/Syphillis: Pt declined screening today. Hepatitis C Screening: Pt declined screening today.      Plan:  I have personally reviewed and addressed the Medicare Annual Wellness questionnaire and have noted the following in the patient's chart:  A. Medical and social history B. Use of alcohol, tobacco or illicit drugs  C. Current medications and supplements D. Functional ability and status E.  Nutritional status F.  Physical activity G. Advance directives H. List of other physicians I.    Hospitalizations, surgeries, and ER visits in previous 12 months J.  [place] such as hearing and vision if needed, cognitive and depression L. Referrals and appointments - none  In addition, I have reviewed and discussed with patient certain preventive protocols, quality metrics, and best practice recommendations. A written personalized care plan for preventive services as well as general preventive health recommendations were provided to patient.  See attached scanned questionnaire for additional information.   Signed,  Fabio Neighbors, LPN Nurse Health Advisor   Nurse Recommendations: None.

## 2017-09-17 ENCOUNTER — Ambulatory Visit
Admission: RE | Admit: 2017-09-17 | Discharge: 2017-09-17 | Disposition: A | Discharge status: Home / Self Care | Payer: Medicare Other | Source: Ambulatory Visit | Attending: Family Medicine | Admitting: Family Medicine

## 2017-09-17 DIAGNOSIS — Z1231 Encounter for screening mammogram for malignant neoplasm of breast: Secondary | ICD-10-CM | POA: Diagnosis not present

## 2017-10-06 DIAGNOSIS — R69 Illness, unspecified: Secondary | ICD-10-CM | POA: Diagnosis not present

## 2017-10-10 DIAGNOSIS — R69 Illness, unspecified: Secondary | ICD-10-CM | POA: Diagnosis not present

## 2017-10-15 DIAGNOSIS — Z961 Presence of intraocular lens: Secondary | ICD-10-CM | POA: Diagnosis not present

## 2017-10-16 ENCOUNTER — Encounter: Payer: Self-pay | Admitting: Family Medicine

## 2017-10-16 ENCOUNTER — Ambulatory Visit (INDEPENDENT_AMBULATORY_CARE_PROVIDER_SITE_OTHER): Payer: Medicare HMO | Admitting: Family Medicine

## 2017-10-16 VITALS — BP 132/64 | HR 84 | Temp 98.0°F | Resp 14 | Wt 114.2 lb

## 2017-10-16 DIAGNOSIS — Z0000 Encounter for general adult medical examination without abnormal findings: Secondary | ICD-10-CM | POA: Diagnosis not present

## 2017-10-16 NOTE — Progress Notes (Signed)
Patient: Morgan Ponce, Female    DOB: Dec 14, 1940, 77 y.o.   MRN: [id number] Visit Date: 10/16/2017  Today's Provider: [name], MD   Chief Complaint  Patient presents with  . Annual Exam   Subjective:   Morgan Ponce is a 77 y.o. female who presents today for her Annual Physical exam. She feels well. She reports exercising house work, stays active daily, no specific exercise regimen. She reports she is sleeping well.  Had visit with nurse advisor for Wellness visit on 09/15/17. Last colonoscopy was 02/06/15 normal, Dr Candace Cruise Mammogram 09/17/17 normal BMD 05/16/17 osteopenia Pap smear- never had hysterectomy, no vaginal issues at this time. Patient use to see Dr Laurey Morale for gynecological issues. 6CIT Screen 08/27/2016  What Year? 0 points  What month? 0 points  What time? 0 points  Count back from 20 0 points  Months in reverse 0 points  Repeat phrase 2 points  Total Score 2    Review of Systems  Constitutional: Negative.   HENT: Negative.   Eyes: Negative.   Respiratory: Negative.   Cardiovascular: Negative.   Gastrointestinal: Negative.   Endocrine: Negative.   Genitourinary: Negative.   Musculoskeletal: Negative.   Skin: Negative.   Allergic/Immunologic: Negative.   Neurological: Negative.   Hematological: Negative.   Psychiatric/Behavioral: Negative.     Patient Active Problem List   Diagnosis Date Noted  . Hypertension 03/21/2016  . History of anemia 08/21/2015  . OP (osteoporosis) 08/21/2015  . Herpes zona 08/21/2015  . Breast calcifications on mammogram 08/16/2014  . Flutter-fibrillation 02/09/2010  . Avitaminosis D 12/12/2008  . Menopausal and postmenopausal disorder 09/13/2008  . BP (high blood pressure) 11/02/2007  . Hypercholesteremia 11/02/2007    Social History   Socioeconomic History  . Marital status: Married    Spouse name: Not on file  . Number of children: 0  . Years of education: Not on file  . Highest education level: Not on file   Social Needs  . Financial resource strain: Not hard at all  . Food insecurity - worry: Never true  . Food insecurity - inability: Never true  . Transportation needs - medical: No  . Transportation needs - non-medical: No  Occupational History  . Not on file  Tobacco Use  . Smoking status: Never Smoker  . Smokeless tobacco: Never Used  Substance and Sexual Activity  . Alcohol use: No    Alcohol/week: 0.0 oz  . Drug use: No  . Sexual activity: No  Other Topics Concern  . Not on file  Social History Narrative  . Not on file    Past Surgical History:  Procedure Laterality Date  . BREAST BIOPSY Right 08/22/2014   benign. Done by Dr Collene Schlichter  . BREAST EXCISIONAL BIOPSY Right 1972   benign  . CATARACT EXTRACTION W/PHACO Right 05/06/2017   Procedure: CATARACT EXTRACTION PHACO AND INTRAOCULAR LENS PLACEMENT (IOC);  Surgeon: Birder Robson, MD;  Location: [facility] ORS;  Service: Ophthalmology;  Laterality: Right;  [place] 00:40 AP% 17.6 CDE 7.16 Fluid pack lot # [id number]  . CATARACT EXTRACTION W/PHACO Left 09/02/2017   Procedure: CATARACT EXTRACTION PHACO AND INTRAOCULAR LENS PLACEMENT ([place]);  Surgeon: Birder Robson, MD;  Location: [facility] ORS;  Service: Ophthalmology;  Laterality: Left;  [place] 00:39 AP% 15.0 CDE 5.91 Fluid pack lot # [id number]  . CHOLECYSTECTOMY  2013  . COLONOSCOPY N/A 02/06/2015   Procedure: COLONOSCOPY;  Surgeon: Hulen Luster, MD;  Location: [facility] ENDOSCOPY;  Service: Gastroenterology;  Laterality: N/A;  .  HYSTEROSCOPY  1999    Her family history includes Breast cancer (age of onset: 63) in her sister; Heart attack in her brother; Heart disease in her father; Hypertension in her father, mother, and sister; Stroke in her mother.     Outpatient Encounter Medications as of 10/16/2017  Medication Sig  . aspirin 81 MG tablet Take 81 mg at bedtime by mouth.   . hydrochlorothiazide (HYDRODIURIL) 25 MG tablet Take 1 tablet (25 mg total) by mouth daily.  Marland Kitchen losartan (COZAAR) 50 MG  tablet Take 1 tablet (50 mg total) by mouth daily.  Marland Kitchen lovastatin (MEVACOR) 10 MG tablet TAKE 1 TABLET BY MOUTH ONCE A DAY (Patient taking differently: TAKE 1 TABLET BY MOUTH AT BEDTIME)  . Polyvinyl Alcohol-Povidone (REFRESH OP) Place 1 drop 5 (five) times daily as needed into both eyes (DRY EYES).  . Vitamin D, Cholecalciferol, 1000 UNITS TABS Take 1 tablet by mouth daily.   No facility-administered encounter medications on file as of 10/16/2017.     Allergies  Allergen Reactions  . Cardizem [Diltiazem] Rash  . Diltiazem Hcl Rash    Patient Care Team: Jerrol Banana., MD as PCP - General (Family Medicine) Jerrol Banana., MD (Family Medicine) Bary Castilla Forest Gleason, MD (General Surgery)   Objective:   Vitals: There were no vitals filed for this visit.  Physical Exam  Constitutional: She is oriented to person, place, and time. She appears well-developed and well-nourished.  HENT:  Head: Normocephalic and atraumatic.  Right Ear: External ear normal.  Left Ear: External ear normal.  Nose: Nose normal.  Mouth/Throat: Oropharynx is clear and moist.  Eyes: Conjunctivae are normal. No scleral icterus.  Neck: No thyromegaly present.  Cardiovascular: Normal rate, regular rhythm, normal heart sounds and intact distal pulses.  Pulmonary/Chest: Effort normal and breath sounds normal.  Abdominal: Soft.  Lymphadenopathy:    She has no cervical adenopathy.  Neurological: She is alert and oriented to person, place, and time.  Skin: Skin is warm and dry.  Psychiatric: She has a normal mood and affect. Her behavior is normal. Judgment and thought content normal.    Activities of Daily Living In your present state of health, do you have any difficulty performing the following activities: 09/15/2017  Hearing? Y  Comment wears bilateral hearing aids  Vision? Y  Comment currently had cataract sx and is adjusting  Difficulty concentrating or making decisions? N  Walking or  climbing stairs? Y  Comment due to knee pain  Dressing or bathing? N  Doing errands, shopping? N  Preparing Food and eating ? N  Using the Toilet? N  In the past six months, have you accidently leaked urine? N  Do you have problems with loss of bowel control? N  Managing your Medications? N  Managing your Finances? N  Housekeeping or managing your Housekeeping? N  Some recent data might be hidden    Fall Risk Assessment Fall Risk  09/15/2017 08/27/2016 03/25/2016 08/22/2015  Falls in the past year? No No No No     Depression Screen PHQ 2/9 Scores 09/15/2017 09/15/2017 08/27/2016 08/22/2015  PHQ - 2 Score 0 0 0 0  PHQ- 9 Score 0 - - -    Assessment & Plan:     Annual Wellness Visit  Reviewed patient's Family Medical History Reviewed and updated list of patient's medical providers Assessment of cognitive impairment was done Assessed patient's functional ability Established a written schedule for health screening [place] Completed and  Reviewed  I have done the exam and reviewed the chart and it is accurate to the best of my knowledge. Development worker, community has been used and  any errors in dictation or transcription are unintentional. Miguel Aschoff M.D. [facility]

## 2017-10-21 DIAGNOSIS — L570 Actinic keratosis: Secondary | ICD-10-CM | POA: Diagnosis not present

## 2017-10-21 DIAGNOSIS — D2261 Melanocytic nevi of right upper limb, including shoulder: Secondary | ICD-10-CM | POA: Diagnosis not present

## 2017-10-21 DIAGNOSIS — Z85828 Personal history of other malignant neoplasm of skin: Secondary | ICD-10-CM | POA: Diagnosis not present

## 2017-10-21 DIAGNOSIS — D225 Melanocytic nevi of trunk: Secondary | ICD-10-CM | POA: Diagnosis not present

## 2017-10-21 DIAGNOSIS — D2272 Melanocytic nevi of left lower limb, including hip: Secondary | ICD-10-CM | POA: Diagnosis not present

## 2017-10-21 DIAGNOSIS — X32XXXA Exposure to sunlight, initial encounter: Secondary | ICD-10-CM | POA: Diagnosis not present

## 2017-10-29 ENCOUNTER — Telehealth: Payer: Self-pay | Admitting: Emergency Medicine

## 2017-10-29 NOTE — Telephone Encounter (Signed)
Received a fax on pt. Due to the large scale nature of this recall, Losartan products may be in limited supply for the near term. Consider changing medication. Please advise.

## 2017-12-22 ENCOUNTER — Other Ambulatory Visit: Payer: Self-pay | Admitting: Family Medicine

## 2017-12-22 DIAGNOSIS — I10 Essential (primary) hypertension: Secondary | ICD-10-CM

## 2018-02-04 DIAGNOSIS — Z888 Allergy status to other drugs, medicaments and biological substances status: Secondary | ICD-10-CM | POA: Diagnosis not present

## 2018-02-04 DIAGNOSIS — Z85828 Personal history of other malignant neoplasm of skin: Secondary | ICD-10-CM | POA: Diagnosis not present

## 2018-02-04 DIAGNOSIS — Z7982 Long term (current) use of aspirin: Secondary | ICD-10-CM | POA: Diagnosis not present

## 2018-02-04 DIAGNOSIS — Z823 Family history of stroke: Secondary | ICD-10-CM | POA: Diagnosis not present

## 2018-02-04 DIAGNOSIS — Z8249 Family history of ischemic heart disease and other diseases of the circulatory system: Secondary | ICD-10-CM | POA: Diagnosis not present

## 2018-02-04 DIAGNOSIS — I10 Essential (primary) hypertension: Secondary | ICD-10-CM | POA: Diagnosis not present

## 2018-02-04 DIAGNOSIS — E785 Hyperlipidemia, unspecified: Secondary | ICD-10-CM | POA: Diagnosis not present

## 2018-02-04 DIAGNOSIS — Z803 Family history of malignant neoplasm of breast: Secondary | ICD-10-CM | POA: Diagnosis not present

## 2018-04-06 DIAGNOSIS — H1859 Other hereditary corneal dystrophies: Secondary | ICD-10-CM | POA: Diagnosis not present

## 2018-04-14 ENCOUNTER — Ambulatory Visit (INDEPENDENT_AMBULATORY_CARE_PROVIDER_SITE_OTHER): Payer: Medicare HMO | Admitting: Family Medicine

## 2018-04-14 ENCOUNTER — Encounter: Payer: Self-pay | Admitting: Family Medicine

## 2018-04-14 VITALS — BP 126/68 | HR 68 | Temp 98.2°F | Resp 16 | Ht 63.0 in | Wt 111.0 lb

## 2018-04-14 DIAGNOSIS — E7800 Pure hypercholesterolemia, unspecified: Secondary | ICD-10-CM

## 2018-04-14 DIAGNOSIS — E559 Vitamin D deficiency, unspecified: Secondary | ICD-10-CM

## 2018-04-14 DIAGNOSIS — I10 Essential (primary) hypertension: Secondary | ICD-10-CM | POA: Diagnosis not present

## 2018-04-14 NOTE — Progress Notes (Signed)
Patient: Morgan Ponce Female    DOB: 1941-07-05   77 y.o.   MRN: [id number] Visit Date: 04/14/2018  Today's Provider: [name], MD   Chief Complaint  Patient presents with  . Hypertension  . Hyperlipidemia   Subjective:    HPI  Hypertension, follow-up:  BP Readings from Last 3 Encounters:  04/14/18 126/68  10/16/17 132/64  09/15/17 128/72    She was last seen for hypertension 6 months ago.  BP at that visit was 132/64. Management since that visit includes no changes. She reports good compliance with treatment. She is not having side effects.  She is exercising. She is adherent to low salt diet.   Outside blood pressures are checked daily. She is experiencing none.  Patient denies none.   Cardiovascular risk factors include dyslipidemia.  Weight trend: stable Wt Readings from Last 3 Encounters:  04/14/18 111 lb (50.3 kg)  10/16/17 114 lb 3.2 oz (51.8 kg)  09/15/17 114 lb 9.6 oz (52 kg)    Current diet: well balanced  Lipid/Cholesterol, Follow-up:   Last seen for this6 months ago.  Management changes since that visit include no changes. . Last Lipid Panel:    Component Value Date/Time   CHOL 191 02/17/2017 1136   TRIG 94 02/17/2017 1136   HDL 66 02/17/2017 1136   CHOLHDL 2.9 02/17/2017 1136   LDLCALC 106 (H) 02/17/2017 1136    Risk factors for vascular disease include hypertension  She reports good compliance with treatment. She is not having side effects.  Current symptoms include none and have been stable.   Allergies  Allergen Reactions  . Cardizem [Diltiazem] Rash  . Diltiazem Hcl Rash     Current Outpatient Medications:  .  aspirin 81 MG tablet, Take 81 mg at bedtime by mouth. , Disp: , Rfl:  .  hydrochlorothiazide (HYDRODIURIL) 25 MG tablet, Take 1 tablet (25 mg total) by mouth daily., Disp: 90 tablet, Rfl: 3 .  losartan (COZAAR) 50 MG tablet, TAKE 1 TABLET BY MOUTH ONCE DAILY, Disp: 90 tablet, Rfl: 2 .  lovastatin  (MEVACOR) 10 MG tablet, Take 1 tablet (10 mg total) by mouth at bedtime., Disp: 90 tablet, Rfl: 3 .  Polyvinyl Alcohol-Povidone (REFRESH OP), Place 1 drop 5 (five) times daily as needed into both eyes (DRY EYES)., Disp: , Rfl:  .  Vitamin D, Cholecalciferol, 1000 UNITS TABS, Take 1 tablet by mouth daily., Disp: , Rfl:   Review of Systems  Constitutional: Negative.   HENT: Negative.   Eyes: Negative.   Respiratory: Negative.   Cardiovascular: Negative for chest pain, palpitations and leg swelling.  Endocrine: Negative.   Musculoskeletal: Negative for arthralgias and myalgias.  Allergic/Immunologic: Negative for environmental allergies.  Neurological: Negative for dizziness, light-headedness and headaches.  Psychiatric/Behavioral: Negative.     Social History   Tobacco Use  . Smoking status: Never Smoker  . Smokeless tobacco: Never Used  Substance Use Topics  . Alcohol use: No    Alcohol/week: 0.0 oz   Objective:   BP 126/68 (BP Location: Left Arm, Patient Position: Sitting, Cuff Size: Normal)   Pulse 68   Temp 98.2 F (36.8 C)   Resp 16   Ht 5\' 3"  (1.6 m)   Wt 111 lb (50.3 kg)   SpO2 99%   BMI 19.66 kg/m  Vitals:   04/14/18 0811  BP: 126/68  Pulse: 68  Resp: 16  Temp: 98.2 F (36.8 C)  SpO2: 99%  Weight:  111 lb (50.3 kg)  Height: 5\' 3"  (1.6 m)     Physical Exam  Constitutional: She is oriented to person, place, and time. She appears well-developed and well-nourished.  HENT:  Head: Normocephalic and atraumatic.  Right Ear: External ear normal.  Left Ear: External ear normal.  Nose: Nose normal.  Eyes: Conjunctivae are normal. No scleral icterus.  Neck: No thyromegaly present.  Cardiovascular: Normal rate, regular rhythm and normal heart sounds.  Pulmonary/Chest: Effort normal and breath sounds normal.  Abdominal: Soft.  Neurological: She is alert and oriented to person, place, and time.  Skin: Skin is warm and dry.  Psychiatric: She has a normal mood and  affect. Her behavior is normal. Judgment and thought content normal.        Assessment & Plan:     1. Essential hypertension  - CBC with Differential/Platelet - Comprehensive metabolic panel - TSH  2. Hypercholesteremia  - Lipid panel  3. Avitaminosis D  - VITAMIN D 25 Hydroxy (Vit-D Deficiency, Fractures) 4.Osteopenia     I have done the exam and reviewed the above chart and it is accurate to the best of my knowledge. Development worker, community has been used in this note in any air is in the dictation or transcription are unintentional.  [name], MD  [place]

## 2018-04-15 LAB — VITAMIN D 25 HYDROXY (VIT D DEFICIENCY, FRACTURES): VIT D 25 HYDROXY: 40 ng/mL (ref 30.0–100.0)

## 2018-04-15 LAB — COMPREHENSIVE METABOLIC PANEL
ALBUMIN: 3.9 g/dL (ref 3.5–4.8)
ALT: 9 IU/L (ref 0–32)
AST: 24 IU/L (ref 0–40)
Albumin/Globulin Ratio: 1.2 (ref 1.2–2.2)
Alkaline Phosphatase: 57 IU/L (ref 39–117)
BUN/Creatinine Ratio: 17 (ref 12–28)
BUN: 18 mg/dL (ref 8–27)
Bilirubin Total: 0.5 mg/dL (ref 0.0–1.2)
CALCIUM: 9.5 mg/dL (ref 8.7–10.3)
CO2: 26 mmol/L (ref 20–29)
CREATININE: 1.03 mg/dL — AB (ref 0.57–1.00)
Chloride: 101 mmol/L (ref 96–106)
GFR, EST AFRICAN AMERICAN: 60 mL/min/{1.73_m2} (ref >59)
GFR, EST NON AFRICAN AMERICAN: 52 mL/min/{1.73_m2} — AB (ref >59)
GLOBULIN, TOTAL: 3.3 g/dL (ref 1.5–4.5)
Glucose: 90 mg/dL (ref 65–99)
Potassium: 4.5 mmol/L (ref 3.5–5.2)
Sodium: 144 mmol/L (ref 134–144)
TOTAL PROTEIN: 7.2 g/dL (ref 6.0–8.5)

## 2018-04-15 LAB — CBC WITH DIFFERENTIAL/PLATELET
BASOS: 1 %
Basophils Absolute: 0 10*3/uL (ref 0.0–0.2)
EOS (ABSOLUTE): 0.2 10*3/uL (ref 0.0–0.4)
Eos: 4 %
HEMOGLOBIN: 11.3 g/dL (ref 11.1–15.9)
Hematocrit: 33.5 % — ABNORMAL LOW (ref 34.0–46.6)
IMMATURE GRANS (ABS): 0 10*3/uL (ref 0.0–0.1)
IMMATURE GRANULOCYTES: 0 %
LYMPHS: 27 %
Lymphocytes Absolute: 1.2 10*3/uL (ref 0.7–3.1)
MCH: 31 pg (ref 26.6–33.0)
MCHC: 33.7 g/dL (ref 31.5–35.7)
MCV: 92 fL (ref 79–97)
MONOCYTES: 10 %
Monocytes Absolute: 0.4 10*3/uL (ref 0.1–0.9)
NEUTROS PCT: 58 %
Neutrophils Absolute: 2.7 10*3/uL (ref 1.4–7.0)
PLATELETS: 214 10*3/uL (ref 150–450)
RBC: 3.65 x10E6/uL — ABNORMAL LOW (ref 3.77–5.28)
RDW: 14.4 % (ref 12.3–15.4)
WBC: 4.6 10*3/uL (ref 3.4–10.8)

## 2018-04-15 LAB — LIPID PANEL
CHOL/HDL RATIO: 2.5 ratio (ref 0.0–4.4)
Cholesterol, Total: 162 mg/dL (ref 100–199)
HDL: 64 mg/dL (ref >39)
LDL CALC: 82 mg/dL (ref 0–99)
Triglycerides: 82 mg/dL (ref 0–149)
VLDL CHOLESTEROL CAL: 16 mg/dL (ref 5–40)

## 2018-04-15 LAB — TSH: TSH: 4.54 u[IU]/mL — AB (ref 0.450–4.500)

## 2018-04-17 ENCOUNTER — Telehealth: Payer: Self-pay

## 2018-04-17 NOTE — Telephone Encounter (Signed)
Pt advised.  Will leave a copy at the front desk for the pt.   Thanks,   -Mickel Baas

## 2018-04-17 NOTE — Telephone Encounter (Signed)
-----   Message from Jerrol Banana., MD sent at 04/17/2018 10:50 AM EDT ----- Labs ok

## 2018-05-20 IMAGING — MG MM DIGITAL SCREENING BILAT W/ TOMO W/ CAD
9 of 12 series · 9 of 28 positions shown · non-contrast
Comparison: Previous exam(s).

CLINICAL DATA: Screening.

EXAM:
2D DIGITAL SCREENING BILATERAL MAMMOGRAM WITH CAD AND ADJUNCT TOMO

[R MLO]
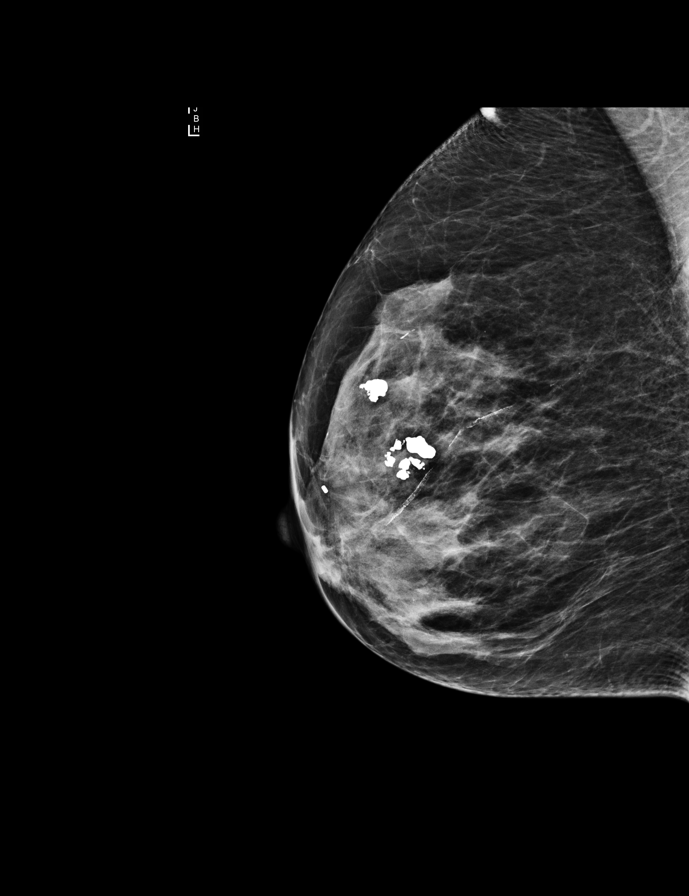

[R CC synth-2D]
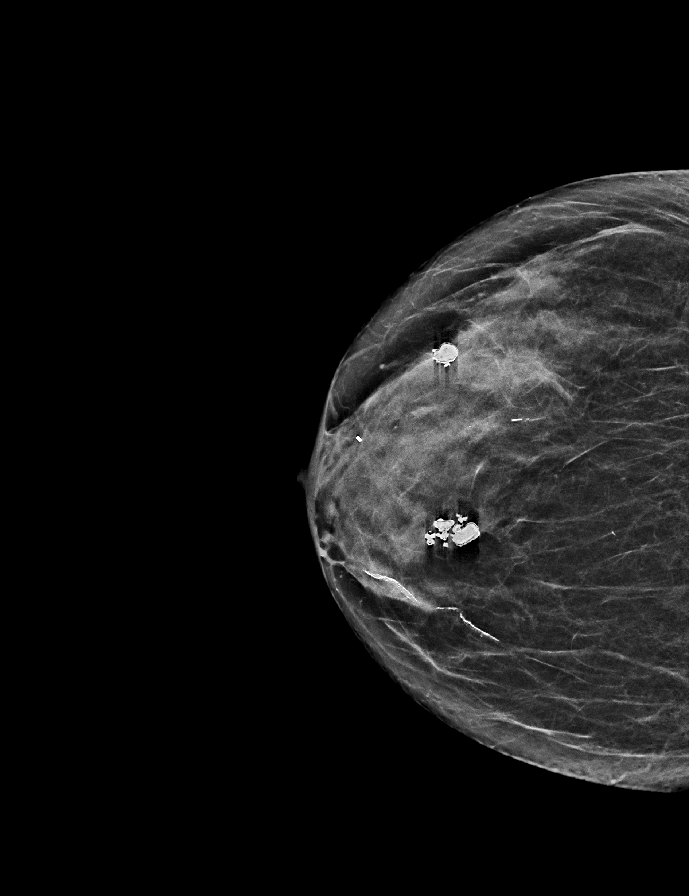

[R CC]
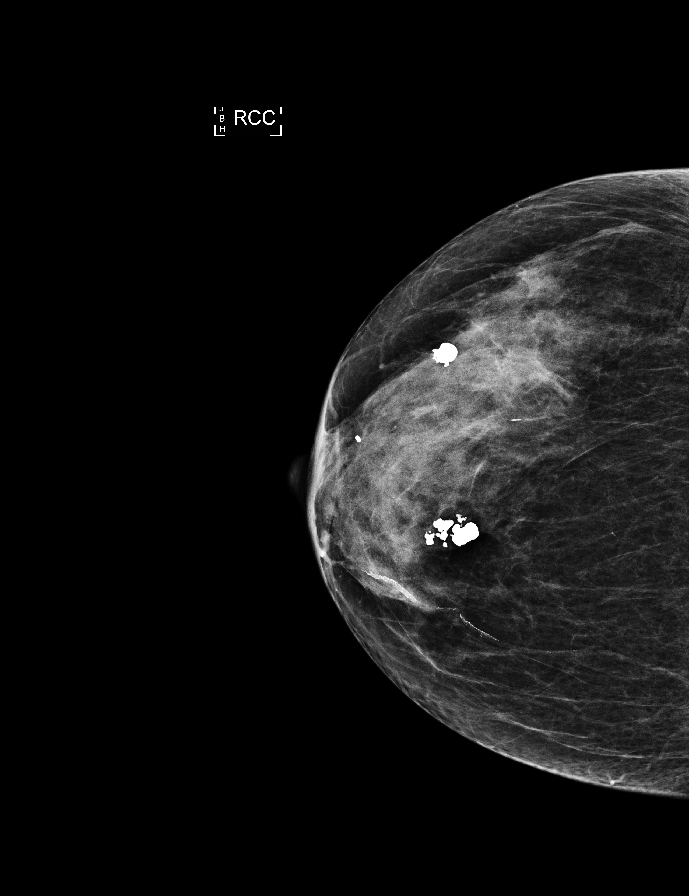

[L CC]
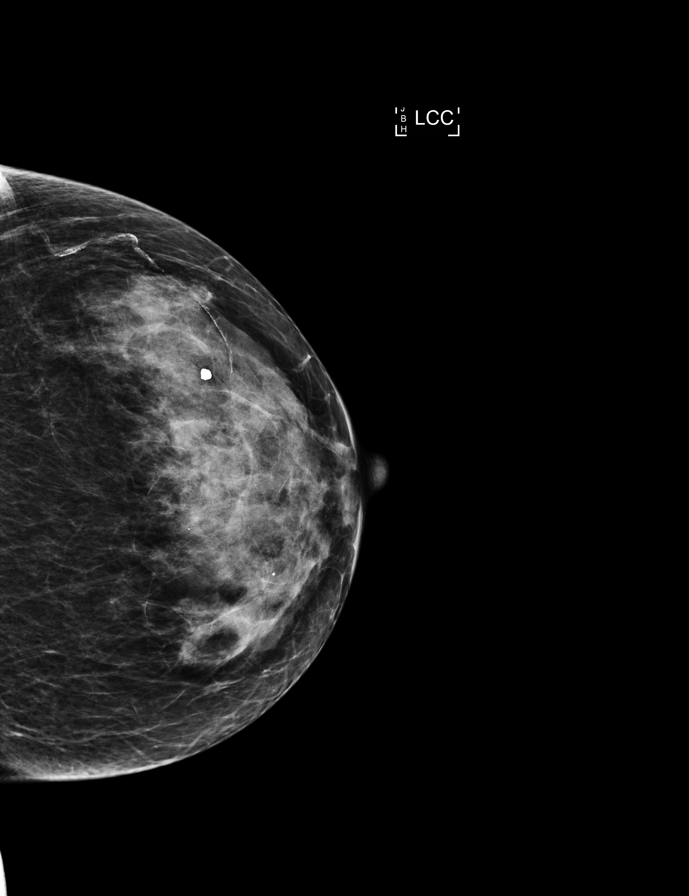

[L MLO synth-2D]
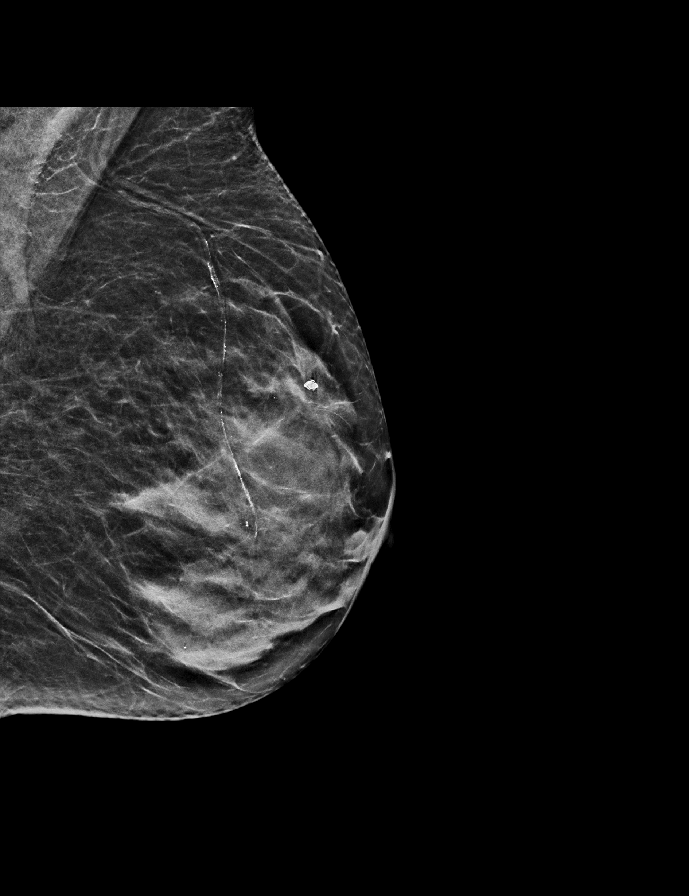

[R MLO synth-2D]
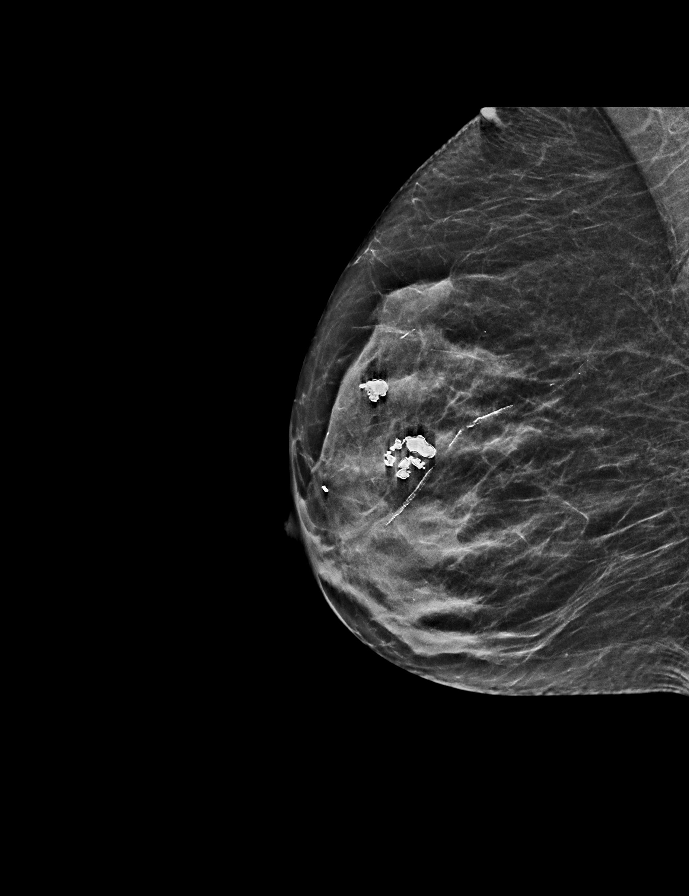

[L MLO]
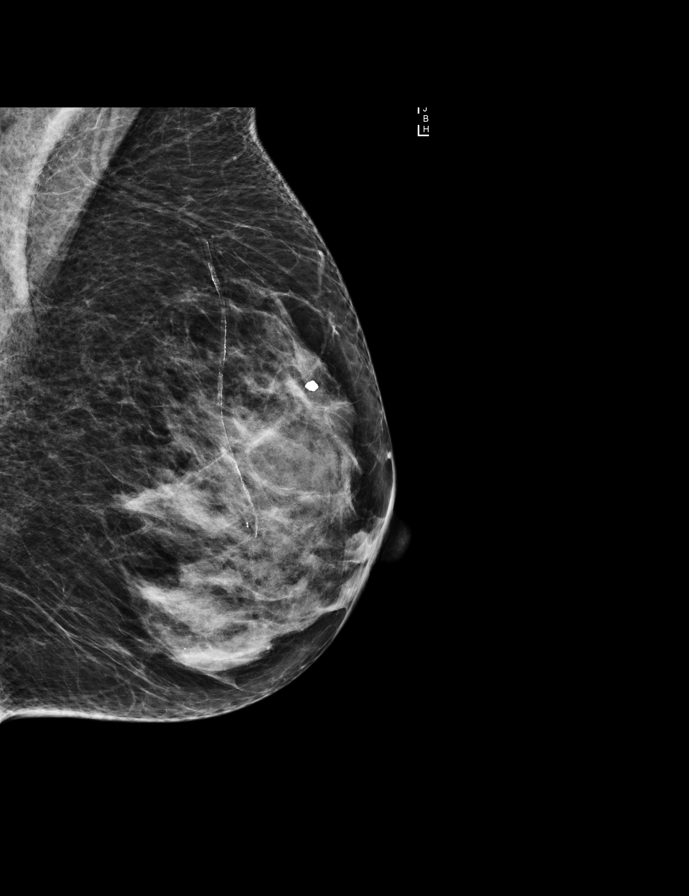

[L CC synth-2D]
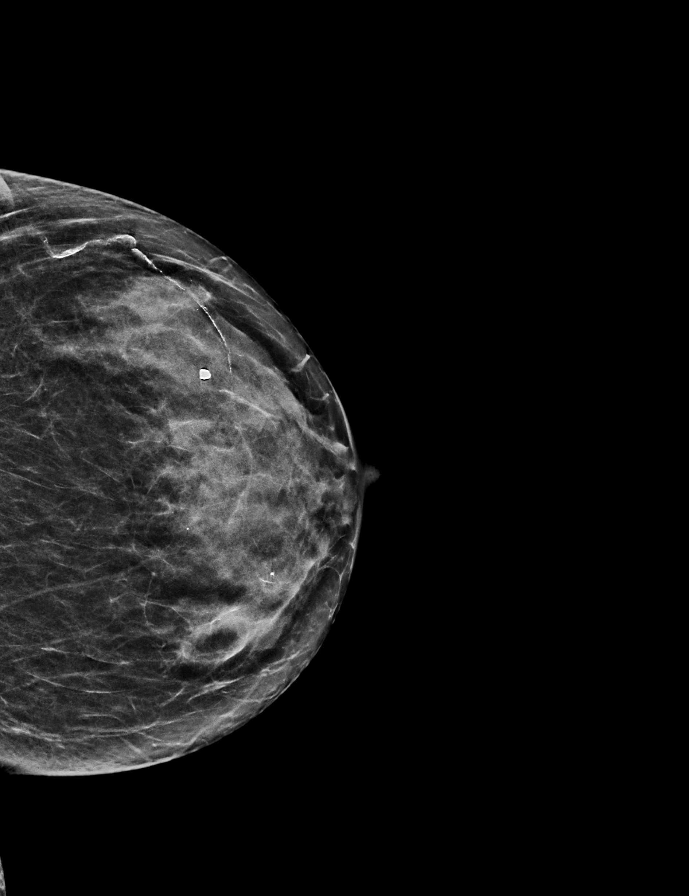

[L MLO tomo · tomo slice 25/50.0]
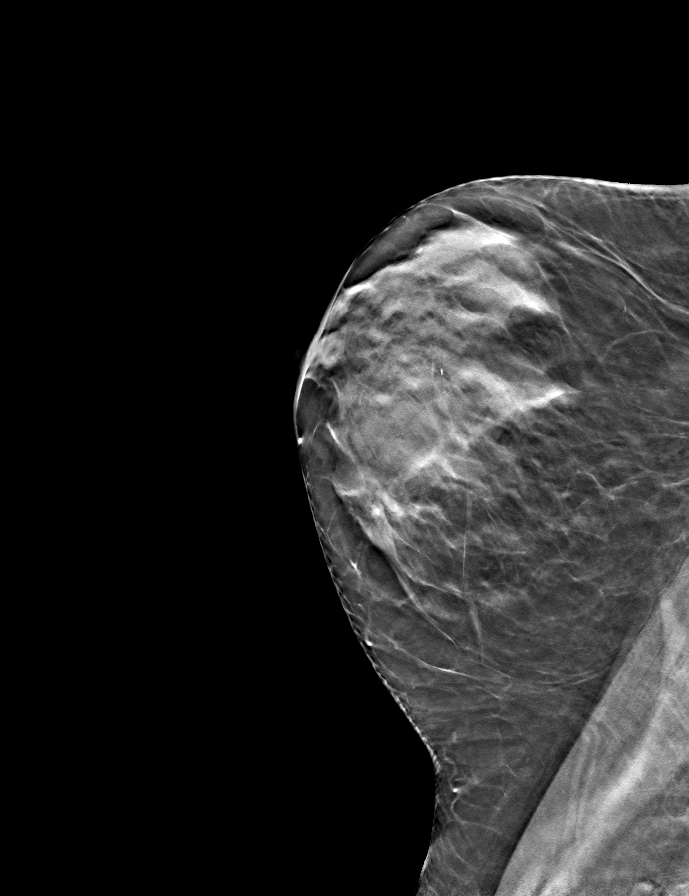

[9 of 28 positions shown; findings below may reference images not displayed]

ACR Breast Density Category d: The breast tissue is extremely dense,
which lowers the sensitivity of mammography.
FINDINGS: There are no findings suspicious for malignancy. Images were
processed with CAD.
IMPRESSION: No mammographic evidence of malignancy. A result letter of this
screening mammogram will be mailed directly to the patient.

RECOMMENDATION:
Screening mammogram in one year. (Code:US-D-RZ7)

BI-RADS CATEGORY  1: Negative.

## 2018-06-03 ENCOUNTER — Telehealth: Payer: Self-pay | Admitting: Family Medicine

## 2018-06-03 NOTE — Telephone Encounter (Signed)
Pt has poison oak on her fingers and on her chest and chin.  She wants to know if you will send something to hte pharmacy  She uses [facility]  CB#  [phone]  Thanks Con Memos

## 2018-06-03 NOTE — Telephone Encounter (Signed)
Would use OTC topical caladryl and hydrocortisone cream.

## 2018-06-03 NOTE — Telephone Encounter (Signed)
Advised patient as below.  

## 2018-06-03 NOTE — Telephone Encounter (Signed)
Please advise. Thanks.  

## 2018-06-06 ENCOUNTER — Ambulatory Visit (INDEPENDENT_AMBULATORY_CARE_PROVIDER_SITE_OTHER): Payer: Medicare HMO | Admitting: Family Medicine

## 2018-06-06 ENCOUNTER — Encounter: Payer: Self-pay | Admitting: Family Medicine

## 2018-06-06 VITALS — BP 160/82 | HR 72 | Temp 97.5°F | Resp 16 | Wt 112.0 lb

## 2018-06-06 DIAGNOSIS — L247 Irritant contact dermatitis due to plants, except food: Secondary | ICD-10-CM | POA: Diagnosis not present

## 2018-06-06 MED ORDER — PREDNISONE 10 MG PO TABS: ORAL_TABLET | ORAL | 0 refills | Status: DC

## 2018-06-06 NOTE — Patient Instructions (Signed)
Poison Oak Dermatitis Poison oak dermatitis is redness and soreness (inflammation) of the skin. It is caused by a chemical that is on the leaves of the poison oak plant. You may also have itching, a rash, and blisters. Symptoms often clear up in 1-2 weeks. You may get this condition by touching a poison oak plant. You can also get it by touching something that has the chemical on it. This may include animals or objects that have come in contact with the plant. Follow these instructions at home: General instructions  Take or apply over-the-counter and prescription medicines only as told by your doctor.  If you touch poison oak, wash your skin with soap and cold water right away.  Use hydrocortisone creams or calamine lotion as needed to help with itching.  Take oatmeal baths as needed. Use colloidal oatmeal. You can get this at a pharmacy or grocery store. Follow the instructions on the package.  Do not scratch or rub your skin.  While you have the rash, wash your clothes right after you wear them. Prevention   Know what poison oak looks like so you can avoid it. This plant has three leaves with flowering branches on a single stem. The leaves are fuzzy. They have a toothlike edge.  If you have touched poison oak, wash with soap and water right away. Be sure to wash under your fingernails.  When hiking or camping, wear long pants, a long-sleeved shirt, tall socks, and hiking boots. You can also use a lotion on your skin that helps to prevent contact with the chemical on the plant.  If you think that your clothes or outdoor gear came in contact with poison oak, rinse them off with a garden hose before you bring them inside your house. Contact a doctor if:  You have open sores in the rash area.  You have more redness, swelling, or pain in the affected area.  You have redness that spreads beyond the rash area.  You have fluid, blood, or pus coming from the affected area.  You have a  fever.  You have a rash over a large area of your body.  You have a rash on your eyes, mouth, or genitals.  Your rash does not improve after a few days. Get help right away if:  Your face swells or your eyes swell shut.  You have trouble breathing.  You have trouble swallowing. This information is not intended to replace advice given to you by your health care provider. Make sure you discuss any questions you have with your health care provider. Document Released: 10/19/2010 Document Revised: 02/22/2016 Document Reviewed: 02/22/2015 Elsevier Interactive Patient Education  2018 Elsevier Inc.  

## 2018-06-06 NOTE — Progress Notes (Signed)
Patient: Morgan Ponce Female    DOB: 1940/11/20   77 y.o.   MRN: [id number] Visit Date: 06/06/2018  Today's Provider: Lavon Paganini, MD   Chief Complaint  Patient presents with  . Rash   Subjective:    HPI Rash: Patient complains of rash involving the upper body. Rash started 1 week ago. Appearance of rash at onset: Color of lesion(s): pink. Rash has changed over time Initial distribution: bilateral arm and neck.  Discomfort associated with rash: is pruritic.  Associated symptoms: none. Denies: fever. Patient has not had previous evaluation of rash. Patient has had previous treatment.  Response to treatment: not better. Patient has had contacts with similar rash. Patient has identified precipitant. Patient has not had new exposures (soaps, lotions, laundry detergents, foods, medications, plants, insects or animals.)  She has had poison ivy several times in the past.  This is a similar rash.  She has been using hydrocortisone and calamine lotion.  Rash continues to spread.  Covers b/l arms and chest.     Allergies  Allergen Reactions  . Cardizem [Diltiazem] Rash  . Diltiazem Hcl Rash     Current Outpatient Medications:  .  aspirin 81 MG tablet, Take 81 mg at bedtime by mouth. , Disp: , Rfl:  .  hydrochlorothiazide (HYDRODIURIL) 25 MG tablet, Take 1 tablet (25 mg total) by mouth daily., Disp: 90 tablet, Rfl: 3 .  losartan (COZAAR) 50 MG tablet, TAKE 1 TABLET BY MOUTH ONCE DAILY, Disp: 90 tablet, Rfl: 2 .  lovastatin (MEVACOR) 10 MG tablet, Take 1 tablet (10 mg total) by mouth at bedtime., Disp: 90 tablet, Rfl: 3 .  Polyvinyl Alcohol-Povidone (REFRESH OP), Place 1 drop 5 (five) times daily as needed into both eyes (DRY EYES)., Disp: , Rfl:  .  Vitamin D, Cholecalciferol, 1000 UNITS TABS, Take 1 tablet by mouth daily., Disp: , Rfl:   Review of Systems  Constitutional: Negative.   Respiratory: Negative.   Cardiovascular: Negative.   Gastrointestinal: Negative.     Musculoskeletal: Negative.   Skin: Positive for rash. Negative for color change, pallor and wound.  Neurological: Negative.   Psychiatric/Behavioral: Negative.     Social History   Tobacco Use  . Smoking status: Never Smoker  . Smokeless tobacco: Never Used  Substance Use Topics  . Alcohol use: No    Alcohol/week: 0.0 standard drinks   Objective:   BP (!) 160/82 (BP Location: Right Arm, Patient Position: Sitting, Cuff Size: Normal)   Pulse 72   Temp (!) 97.5 F (36.4 C) (Oral)   Resp 16   Wt 112 lb (50.8 kg)   SpO2 100%   BMI 19.84 kg/m  Vitals:   06/06/18 1001  BP: (!) 160/82  Pulse: 72  Resp: 16  Temp: (!) 97.5 F (36.4 C)  TempSrc: Oral  SpO2: 100%  Weight: 112 lb (50.8 kg)     Physical Exam  Constitutional: She is oriented to person, place, and time. She appears well-developed and well-nourished. No distress.  HENT:  Head: Normocephalic and atraumatic.  Mouth/Throat: Oropharynx is clear and moist.  Eyes: Conjunctivae are normal. No scleral icterus.  Neck: Neck supple. No thyromegaly present.  Cardiovascular: Normal rate, regular rhythm, normal heart sounds and intact distal pulses.  No murmur heard. Pulmonary/Chest: Effort normal and breath sounds normal. No respiratory distress. She has no wheezes. She has no rales.  Musculoskeletal: She exhibits no edema.  Lymphadenopathy:    She has no cervical adenopathy.  Neurological: She is alert and oriented to person, place, and time.  Skin: Skin is warm and dry. Capillary refill takes less than 2 seconds. Rash (vesicular and erythematous over b/l arms and chest) noted.  Vitals reviewed.       Assessment & Plan:   1. Irritant contact dermatitis due to plants, except food -Rash is consistent with poison oak/irritant contact dermatitis -Given widespread nature and lack of improvement with symptomatic treatment, will give prednisone taper -Discussed with patient the importance of taking the whole course and  the reason for this being a long taper so that it will not rebound after stopping -She can continue to use hydrocortisone cream, antihistamines, and calamine lotion for symptomatic management -Discussed return precautions and signs of infection -Discussed importance of handwashing and washing her bedding and linens regularly to avoid spreading this    Meds ordered this encounter  Medications  . predniSONE (DELTASONE) 10 MG tablet    Sig: Take 40mg  PO daily x5 days, then 30mg  daily for 5 days, then 20mg  daily for 5 days, then 10mg  daily for 5 days    Dispense:  50 tablet    Refill:  0     Return if symptoms worsen or fail to improve.   The entirety of the information documented in the History of Present Illness, Review of Systems and Physical Exam were personally obtained by me. Portions of this information were initially documented by Lendon Ka, CMA and reviewed by me for thoroughness and accuracy.    Virginia Crews, MD, MPH [facility] 06/06/2018 10:17 AM

## 2018-06-22 DIAGNOSIS — Z08 Encounter for follow-up examination after completed treatment for malignant neoplasm: Secondary | ICD-10-CM | POA: Diagnosis not present

## 2018-06-22 DIAGNOSIS — L538 Other specified erythematous conditions: Secondary | ICD-10-CM | POA: Diagnosis not present

## 2018-06-22 DIAGNOSIS — L821 Other seborrheic keratosis: Secondary | ICD-10-CM | POA: Diagnosis not present

## 2018-06-22 DIAGNOSIS — D485 Neoplasm of uncertain behavior of skin: Secondary | ICD-10-CM | POA: Diagnosis not present

## 2018-06-22 DIAGNOSIS — Z85828 Personal history of other malignant neoplasm of skin: Secondary | ICD-10-CM | POA: Diagnosis not present

## 2018-06-23 ENCOUNTER — Other Ambulatory Visit: Payer: Self-pay | Admitting: Family Medicine

## 2018-07-02 ENCOUNTER — Ambulatory Visit (INDEPENDENT_AMBULATORY_CARE_PROVIDER_SITE_OTHER): Payer: Medicare HMO | Admitting: Family Medicine

## 2018-07-02 DIAGNOSIS — Z23 Encounter for immunization: Secondary | ICD-10-CM | POA: Diagnosis not present

## 2018-07-06 DIAGNOSIS — R69 Illness, unspecified: Secondary | ICD-10-CM | POA: Diagnosis not present

## 2018-08-07 DIAGNOSIS — D485 Neoplasm of uncertain behavior of skin: Secondary | ICD-10-CM | POA: Diagnosis not present

## 2018-08-10 ENCOUNTER — Other Ambulatory Visit: Payer: Self-pay | Admitting: Family Medicine

## 2018-08-10 DIAGNOSIS — Z1231 Encounter for screening mammogram for malignant neoplasm of breast: Secondary | ICD-10-CM

## 2018-09-16 ENCOUNTER — Other Ambulatory Visit: Payer: Self-pay | Admitting: Family Medicine

## 2018-09-16 DIAGNOSIS — I10 Essential (primary) hypertension: Secondary | ICD-10-CM

## 2018-10-02 ENCOUNTER — Ambulatory Visit
Admission: RE | Admit: 2018-10-02 | Discharge: 2018-10-02 | Disposition: A | Discharge status: Home / Self Care | Payer: Medicare HMO | Source: Ambulatory Visit | Attending: Family Medicine | Admitting: Family Medicine

## 2018-10-02 DIAGNOSIS — Z1231 Encounter for screening mammogram for malignant neoplasm of breast: Secondary | ICD-10-CM | POA: Insufficient documentation

## 2018-11-10 ENCOUNTER — Ambulatory Visit (INDEPENDENT_AMBULATORY_CARE_PROVIDER_SITE_OTHER): Payer: Medicare HMO

## 2018-11-10 VITALS — BP 134/68 | HR 74 | Temp 98.1°F | Ht 63.0 in | Wt 113.4 lb

## 2018-11-10 DIAGNOSIS — Z0000 Encounter for general adult medical examination without abnormal findings: Secondary | ICD-10-CM | POA: Diagnosis not present

## 2018-11-10 NOTE — Patient Instructions (Signed)
[name] , Thank you for taking time to come for your Medicare Wellness Visit. I appreciate your ongoing commitment to your health goals. Please review the following plan we discussed and let me know if I can assist you in the future.   Screening recommendations/referrals: Colonoscopy: No longer required.  Mammogram: No longer required.  Bone Density: Up to date, due 04/2022  Recommended yearly ophthalmology/optometry visit for glaucoma screening and checkup Recommended yearly dental visit for hygiene and checkup  Vaccinations: Influenza vaccine: Up to date Pneumococcal vaccine: Completed series Tdap vaccine: Up to date, due 04/2021 Shingles vaccine: Pt declines today.     Advanced directives: Please bring a copy of your POA (Power of Attorney) and/or Living Will to your next appointment.   Conditions/risks identified: Continue trying to increase water intake to 6-8 8 oz glasses a day.   Next appointment: 11/17/18 with Dr Morgan Ponce   Preventive Care 80 Years and Older, Female Preventive care refers to lifestyle choices and visits with your health care provider that can promote health and wellness. What does preventive care include?  A yearly physical exam. This is also called an annual well check.  Dental exams once or twice a year.  Routine eye exams. Ask your health care provider how often you should have your eyes checked.  Personal lifestyle choices, including:  Daily care of your teeth and gums.  Regular physical activity.  Eating a healthy diet.  Avoiding tobacco and drug use.  Limiting alcohol use.  Practicing safe sex.  Taking low-dose aspirin every day.  Taking vitamin and mineral supplements as recommended by your health care provider. What happens during an annual well check? The services and screenings done by your health care provider during your annual well check will depend on your age, overall health, lifestyle risk factors, and family history of  disease. Counseling  Your health care provider may ask you questions about your:  Alcohol use.  Tobacco use.  Drug use.  Emotional well-being.  Home and relationship well-being.  Sexual activity.  Eating habits.  History of falls.  Memory and ability to understand (cognition).  Work and work Statistician.  Reproductive health. Screening  You may have the following tests or measurements:  Height, weight, and BMI.  Blood pressure.  Lipid and cholesterol levels. These may be checked every 5 years, or more frequently if you are over 53 years old.  Skin check.  Lung cancer screening. You may have this screening every year starting at age 39 if you have a 30-pack-year history of smoking and currently smoke or have quit within the past 15 years.  Fecal occult blood test (FOBT) of the stool. You may have this test every year starting at age 18.  Flexible sigmoidoscopy or colonoscopy. You may have a sigmoidoscopy every 5 years or a colonoscopy every 10 years starting at age 47.  Hepatitis C blood test.  Hepatitis B blood test.  Sexually transmitted disease (STD) testing.  Diabetes screening. This is done by checking your blood sugar (glucose) after you have not eaten for a while (fasting). You may have this done every 1-3 years.  Bone density scan. This is done to screen for osteoporosis. You may have this done starting at age 3.  Mammogram. This may be done every 1-2 years. Talk to your health care provider about how often you should have regular mammograms. Talk with your health care provider about your test results, treatment options, and if necessary, the need for more tests. Vaccines  Your health care provider may recommend certain vaccines, such as:  Influenza vaccine. This is recommended every year.  Tetanus, diphtheria, and acellular pertussis (Tdap, Td) vaccine. You may need a Td booster every 10 years.  Zoster vaccine. You may need this after age  80.  Pneumococcal 13-valent conjugate (PCV13) vaccine. One dose is recommended after age 19.  Pneumococcal polysaccharide (PPSV23) vaccine. One dose is recommended after age 2. Talk to your health care provider about which screenings and vaccines you need and how often you need them. This information is not intended to replace advice given to you by your health care provider. Make sure you discuss any questions you have with your health care provider. Document Released: 10/13/2015 Document Revised: 06/05/2016 Document Reviewed: 07/18/2015 Elsevier Interactive Patient Education  2017 [place] Prevention in the Home Falls can cause injuries. They can happen to people of all ages. There are many things you can do to make your home safe and to help prevent falls. What can I do on the outside of my home?  Regularly fix the edges of walkways and driveways and fix any cracks.  Remove anything that might make you trip as you walk through a door, such as a raised step or threshold.  Trim any bushes or trees on the path to your home.  Use bright outdoor lighting.  Clear any walking paths of anything that might make someone trip, such as rocks or tools.  Regularly check to see if handrails are loose or broken. Make sure that both sides of any steps have handrails.  Any raised decks and porches should have guardrails on the edges.  Have any leaves, snow, or ice cleared regularly.  Use sand or salt on walking paths during winter.  Clean up any spills in your garage right away. This includes oil or grease spills. What can I do in the bathroom?  Use night lights.  Install grab bars by the toilet and in the tub and shower. Do not use towel bars as grab bars.  Use non-skid mats or decals in the tub or shower.  If you need to sit down in the shower, use a plastic, non-slip stool.  Keep the floor dry. Clean up any water that spills on the floor as soon as it happens.  Remove  soap buildup in the tub or shower regularly.  Attach bath mats securely with double-sided non-slip rug tape.  Do not have throw rugs and other things on the floor that can make you trip. What can I do in the bedroom?  Use night lights.  Make sure that you have a light by your bed that is easy to reach.  Do not use any sheets or blankets that are too big for your bed. They should not hang down onto the floor.  Have a firm chair that has side arms. You can use this for support while you get dressed.  Do not have throw rugs and other things on the floor that can make you trip. What can I do in the kitchen?  Clean up any spills right away.  Avoid walking on wet floors.  Keep items that you use a lot in easy-to-reach places.  If you need to reach something above you, use a strong step stool that has a grab bar.  Keep electrical cords out of the way.  Do not use floor polish or wax that makes floors slippery. If you must use wax, use non-skid floor wax.  Do  not have throw rugs and other things on the floor that can make you trip. What can I do with my stairs?  Do not leave any items on the stairs.  Make sure that there are handrails on both sides of the stairs and use them. Fix handrails that are broken or loose. Make sure that handrails are as long as the stairways.  Check any carpeting to make sure that it is firmly attached to the stairs. Fix any carpet that is loose or worn.  Avoid having throw rugs at the top or bottom of the stairs. If you do have throw rugs, attach them to the floor with carpet tape.  Make sure that you have a light switch at the top of the stairs and the bottom of the stairs. If you do not have them, ask someone to add them for you. What else can I do to help prevent falls?  Wear shoes that:  Do not have high heels.  Have rubber bottoms.  Are comfortable and fit you well.  Are closed at the toe. Do not wear sandals.  If you use a  stepladder:  Make sure that it is fully opened. Do not climb a closed stepladder.  Make sure that both sides of the stepladder are locked into place.  Ask someone to hold it for you, if possible.  Clearly mark and make sure that you can see:  Any grab bars or handrails.  First and last steps.  Where the edge of each step is.  Use tools that help you move around (mobility aids) if they are needed. These include:  Canes.  Walkers.  Scooters.  Crutches.  Turn on the lights when you go into a dark area. Replace any light bulbs as soon as they burn out.  Set up your furniture so you have a clear path. Avoid moving your furniture around.  If any of your floors are uneven, fix them.  If there are any pets around you, be aware of where they are.  Review your medicines with your doctor. Some medicines can make you feel dizzy. This can increase your chance of falling. Ask your doctor what other things that you can do to help prevent falls. This information is not intended to replace advice given to you by your health care provider. Make sure you discuss any questions you have with your health care provider. Document Released: 07/13/2009 Document Revised: 02/22/2016 Document Reviewed: 10/21/2014 Elsevier Interactive Patient Education  2017 [employer].

## 2018-11-10 NOTE — Progress Notes (Signed)
.   Subjective:   Morgan Ponce is a 78 y.o. female who presents for Medicare Annual (Subsequent) preventive examination.  Review of Systems:  N/A  Cardiac Risk Factors include: advanced age (>34men, >62 women);dyslipidemia;hypertension     Objective:     Vitals: BP 134/68 (BP Location: Left Arm)   Pulse 74   Temp 98.1 F (36.7 C) (Oral)   Ht 5\' 3"  (1.6 m)   Wt 113 lb 6.4 oz (51.4 kg)   BMI 20.09 kg/m   Body mass index is 20.09 kg/m.  Advanced Directives 11/10/2018 09/15/2017 09/02/2017 05/06/2017 04/21/2017 08/27/2016 02/06/2015  Does Patient Have a Medical Advance Directive? Yes Yes Yes Yes Yes Yes Yes  Type of Paramedic of [place];Living will [place];Living will [place];Living will [place];Living will Living will;Healthcare Power of [place] will  Does patient want to make changes to medical advance directive? - - - No - Patient declined - - -  Copy of [place] in Chart? No - copy requested No - copy requested No - copy requested No - copy requested - - No - copy requested    Tobacco Social History   Tobacco Use  Smoking Status Never Smoker  Smokeless Tobacco Never Used     Counseling given: Not Answered   Clinical Intake:  Pre-visit preparation completed: Yes  Pain : No/denies pain Pain Score: 0-No pain     Nutritional Status: BMI of 19-24  Normal Nutritional Risks: None Diabetes: No  How often do you need to have someone help you when you read instructions, pamphlets, or other written materials from your doctor or pharmacy?: 1 - Never  Interpreter Needed?: No  Information entered by :: [facility], LPN  Past Medical History:  Diagnosis Date  . Arthritis   . Dysrhythmia   . HOH (hard of hearing)    AIDS  . Hyperlipidemia   . Hypertension    RESTARTED BP MED SINCE FIRST CATARACT  . Pain    RIGHT KNEE  . Skin cancer of forehead     Past Surgical History:  Procedure Laterality Date  . BREAST BIOPSY Right 08/22/2014   benign. Done by Dr Collene Schlichter  . BREAST EXCISIONAL BIOPSY Right 1972   benign  . CATARACT EXTRACTION W/PHACO Right 05/06/2017   Procedure: CATARACT EXTRACTION PHACO AND INTRAOCULAR LENS PLACEMENT (IOC);  Surgeon: Birder Robson, MD;  Location: [facility] ORS;  Service: Ophthalmology;  Laterality: Right;  [place] 00:40 AP% 17.6 CDE 7.16 Fluid pack lot # [id number]  . CATARACT EXTRACTION W/PHACO Left 09/02/2017   Procedure: CATARACT EXTRACTION PHACO AND INTRAOCULAR LENS PLACEMENT ([place]);  Surgeon: Birder Robson, MD;  Location: [facility] ORS;  Service: Ophthalmology;  Laterality: Left;  [place] 00:39 AP% 15.0 CDE 5.91 Fluid pack lot # [id number]  . CHOLECYSTECTOMY  2013  . COLONOSCOPY N/A 02/06/2015   Procedure: COLONOSCOPY;  Surgeon: Hulen Luster, MD;  Location: [facility] ENDOSCOPY;  Service: Gastroenterology;  Laterality: N/A;  . HYSTEROSCOPY  1999   Family History  Problem Relation Age of Onset  . Hypertension Mother   . Stroke Mother   . Heart disease Father   . Hypertension Father   . Hypertension Sister   . Breast cancer Sister 73  . Heart attack Brother    Social History   Socioeconomic History  . Marital status: Married    Spouse name: Not on file  . Number of children: 0  . Years of education:  Not on file  . Highest education level: High school graduate  Occupational History  . 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 Financial resource strain: Not hard at all  . Food insecurity:    Worry: Never true    Inability: Never true  . Transportation needs:    Medical: No    Non-medical: No  Tobacco Use  . Smoking status: Never Smoker  . Smokeless tobacco: Never Used  Substance and Sexual Activity  . Alcohol use: No    Alcohol/week: 0.0 standard drinks  . Drug use: No  . Sexual activity: Never  Lifestyle  . Physical activity:    Days per week: 0 days    Minutes per  session: 0 min  . Stress: Not at all  Relationships  . Social connections:    Talks on phone: More than three times a week    Gets together: Once a week    Attends religious service: More than 4 times per year    Active member of club or organization: No    Attends meetings of clubs or organizations: Never    Relationship status: Married  Other Topics Concern  . Not on file  Social History Narrative  . Not on file    Outpatient Encounter Medications as of 11/10/2018  Medication Sig  . aspirin 81 MG tablet Take 81 mg at bedtime by mouth.   . hydrochlorothiazide (HYDRODIURIL) 25 MG tablet TAKE 1 TABLET BY MOUTH ONCE A DAY  . losartan (COZAAR) 50 MG tablet TAKE 1 TABLET BY MOUTH ONCE A DAY  . lovastatin (MEVACOR) 10 MG tablet TAKE 1 TABLET BY MOUTH DAILY AT BEDTIME  . Polyvinyl Alcohol-Povidone (REFRESH OP) Place 1 drop 5 (five) times daily as needed into both eyes (DRY EYES).  . Vitamin D, Cholecalciferol, 1000 UNITS TABS Take 1 tablet by mouth daily.  . predniSONE (DELTASONE) 10 MG tablet Take 40mg  PO daily x5 days, then 30mg  daily for 5 days, then 20mg  daily for 5 days, then 10mg  daily for 5 days (Patient not taking: Reported on 11/10/2018)   No facility-administered encounter medications on file as of 11/10/2018.     Activities of Daily Living In your present state of health, do you have any difficulty performing the following activities: 11/10/2018  Hearing? Y  Comment Wears bilateral hearing aids.  Vision? N  Difficulty concentrating or making decisions? N  Walking or climbing stairs? Y  Comment Due to right knee pain.   Dressing or bathing? N  Doing errands, shopping? N  Preparing Food and eating ? N  Using the Toilet? N  In the past six months, have you accidently leaked urine? N  Do you have problems with loss of bowel control? N  Managing your Medications? N  Managing your Finances? N  Housekeeping or managing your Housekeeping? N  Some recent data might be hidden     Patient Care Team: Jerrol Banana., MD as PCP - General (Family Medicine) Birder Robson, MD as Referring Physician (Ophthalmology)    Assessment:   This is a routine wellness examination for Morgan Ponce.  Exercise Activities and Dietary recommendations Current Exercise Habits: The patient does not participate in regular exercise at present, Exercise limited by: None identified  Goals    . DIET - INCREASE WATER INTAKE     Recommend increasing water intake to 4-6 glasses a day.        Fall Risk Fall Risk  11/10/2018 11/10/2018 09/15/2017 08/27/2016 03/25/2016  Falls in the past year? 0 0 No No No   FALL RISK PREVENTION PERTAINING TO THE HOME:  Any stairs in or around the home WITH handrails? No  Home free of loose throw rugs in walkways, pet beds, electrical cords, etc? Yes  Adequate lighting in your home to reduce risk of falls? Yes   ASSISTIVE DEVICES UTILIZED TO PREVENT FALLS:  Life alert? No  Use of a cane, walker or w/c? No  Grab bars in the bathroom? No  Shower chair or bench in shower? No  Elevated toilet seat or a handicapped toilet? No    TIMED UP AND GO:  Was the test performed? No .    Depression Screen PHQ 2/9 Scores 11/10/2018 11/10/2018 11/10/2018 09/15/2017  PHQ - 2 Score 0 0 0 0  PHQ- 9 Score 0 - - 0     Cognitive Function     6CIT Screen 11/10/2018 08/27/2016  What Year? 0 points 0 points  What month? 0 points 0 points  What time? 0 points 0 points  Count back from 20 0 points 0 points  Months in reverse 0 points 0 points  Repeat phrase 0 points 2 points  Total Score 0 2    Immunization History  Administered Date(s) Administered  . Influenza, High Dose Seasonal PF 06/10/2015, 06/14/2016, 07/01/2017, 07/02/2018  . Pneumococcal Conjugate-13 01/16/2015  . Pneumococcal Polysaccharide-23 08/27/2004  . Tdap 05/27/2011  . Zoster 07/06/2013    Qualifies for Shingles Vaccine? Yes  Zostavax completed 07/06/13. Due for Shingrix. Education  has been provided regarding the importance of this vaccine. Pt has been advised to call insurance company to determine out of pocket expense. Advised may also receive vaccine at local pharmacy or Health Dept. Verbalized acceptance and understanding.  Tdap: Up to date  Flu Vaccine: Up to date  Pneumococcal Vaccine: Up to date   Screening Tests Health Maintenance  Topic Date Due  . TETANUS/TDAP  05/26/2021  . DEXA SCAN  05/16/2022  . INFLUENZA VACCINE  Completed  . PNA vac Low Risk Adult  Completed    Cancer Screenings:  Colorectal Screening: No longer required.   Mammogram: No longer required.   Bone Density: Completed 05/16/17. Results reflect OSTEOPENIA. Repeat every 5 years.   Lung Cancer Screening: (Low Dose CT Chest recommended if Age 27-80 years, 30 pack-year currently smoking OR have quit w/in 15years.) does not qualify.    Additional Screening:  Vision Screening: Recommended annual ophthalmology exams for early detection of glaucoma and other disorders of the eye.  Dental Screening: Recommended annual dental exams for proper oral hygiene  Community Resource Referral:  CRR required this visit?  No       Plan:  I have personally reviewed and addressed the Medicare Annual Wellness questionnaire and have noted the following in the patient's chart:  A. Medical and social history B. Use of alcohol, tobacco or illicit drugs  C. Current medications and supplements D. Functional ability and status E.  Nutritional status F.  Physical activity G. Advance directives H. List of other physicians I.  Hospitalizations, surgeries, and ER visits in previous 12 months [place] such as hearing and vision if needed, cognitive and depression L. Referrals and appointments - none  In addition, I have reviewed and discussed with patient certain preventive protocols, quality metrics, and best practice recommendations. A written personalized care plan for  preventive services as well as general preventive health recommendations were provided  to patient.  See attached scanned questionnaire for additional information.   Signed,  Fabio Neighbors, LPN Nurse Health Advisor   Nurse Recommendations: None.

## 2018-11-17 ENCOUNTER — Encounter: Payer: Medicare HMO | Admitting: Family Medicine

## 2019-03-08 ENCOUNTER — Encounter: Payer: Self-pay | Admitting: Family Medicine

## 2019-03-08 ENCOUNTER — Ambulatory Visit (INDEPENDENT_AMBULATORY_CARE_PROVIDER_SITE_OTHER): Payer: Medicare HMO | Admitting: Family Medicine

## 2019-03-08 ENCOUNTER — Other Ambulatory Visit: Payer: Self-pay

## 2019-03-08 VITALS — BP 160/67 | HR 80 | Temp 97.8°F | Resp 16 | Ht 63.0 in | Wt 106.2 lb

## 2019-03-08 DIAGNOSIS — E559 Vitamin D deficiency, unspecified: Secondary | ICD-10-CM | POA: Diagnosis not present

## 2019-03-08 DIAGNOSIS — Z0000 Encounter for general adult medical examination without abnormal findings: Secondary | ICD-10-CM

## 2019-03-08 DIAGNOSIS — M25511 Pain in right shoulder: Secondary | ICD-10-CM | POA: Diagnosis not present

## 2019-03-08 DIAGNOSIS — E7800 Pure hypercholesterolemia, unspecified: Secondary | ICD-10-CM

## 2019-03-08 DIAGNOSIS — M25512 Pain in left shoulder: Secondary | ICD-10-CM

## 2019-03-08 DIAGNOSIS — I10 Essential (primary) hypertension: Secondary | ICD-10-CM | POA: Diagnosis not present

## 2019-03-08 NOTE — Progress Notes (Signed)
Patient: Morgan Ponce, Female    DOB: 07-03-1941, 78 y.o.   MRN: [id number] Visit Date: 03/08/2019  Today's Provider: [name], MD   Chief Complaint  Patient presents with  . Medicare Wellness   Subjective:     Annual wellness visit Morgan Ponce is a 78 y.o. female. She feels well. She reports exercising by walking daily. She reports she is sleeping fairly well. Patient states that she would like to address shoulder pain and stiffness that she has noticed over the past several weeks. Patient reports that she has a history of arthritis and believes that it may be related to pain she is having, patient reports stiffness in arms and shoulders in AM only. Patient reports that she does have concerns of finding blood in her stool in December, patient denies having any further episodes of blood being seen but states that she is high risk due to her family history and wants to know if she should follow back up with G.[facility], patient reports that she has previously seen Dr. Candace Cruise in the past at [facility]. Patient states that last colonoscopy was 01/2015 and was normal.  Her husband died a couple of months ago from chronic heart disease.  She is coping well. -----------------------------------------------------------   Hypertension, follow-up:  BP Readings from Last 3 Encounters:  03/08/19 (!) 160/67  11/10/18 134/68  06/06/18 (!) 160/82    She was last seen for hypertension 6 months ago.  BP at that visit was 126/68. Management changes since that visit include none. She reports excellent compliance with treatment. She is not having side effects.  She is exercising. She is adherent to low salt diet.   Outside blood pressures are systolic ranging from 782-956 and diastolic ranging from 21-30. She is experiencing none.  Patient denies chest pain, chest pressure/discomfort, claudication, dyspnea, exertional chest pressure/discomfort, fatigue, irregular heart beat, lower  extremity edema, near-syncope, orthopnea, palpitations, paroxysmal nocturnal dyspnea, syncope and tachypnea.   Cardiovascular risk factors include advanced age (older than 75 for men, 52 for women) and hypertension.  Use of agents associated with hypertension: none.     Weight trend: stable Wt Readings from Last 3 Encounters:  03/08/19 106 lb 3.2 oz (48.2 kg)  11/10/18 113 lb 6.4 oz (51.4 kg)  06/06/18 112 lb (50.8 kg)    Current diet: in general, a "healthy" diet    ------------------------------------------------------------------------  Lipid/Cholesterol, Follow-up:   Last seen for this6 months ago.  Management changes since that visit include none. . Last Lipid Panel:    Component Value Date/Time   CHOL 162 04/14/2018 0850   TRIG 82 04/14/2018 0850   HDL 64 04/14/2018 0850   CHOLHDL 2.5 04/14/2018 0850   LDLCALC 82 04/14/2018 0850    Risk factors for vascular disease include hypertension  She reports excellent compliance with treatment. She is not having side effects.  Current symptoms include none and have been stable. Weight trend: stable Prior visit with dietician: no Current diet: not asked Current exercise: walking  Wt Readings from Last 3 Encounters:  03/08/19 106 lb 3.2 oz (48.2 kg)  11/10/18 113 lb 6.4 oz (51.4 kg)  06/06/18 112 lb (50.8 kg)    -------------------------------------------------------------------  Review of Systems  Constitutional: Negative.   HENT: Negative.   Eyes: Negative.   Respiratory: Negative.   Gastrointestinal: Positive for blood in stool.  Endocrine: Negative.   Genitourinary: Negative.   Musculoskeletal: Positive for arthralgias.  Skin: Negative.  Allergic/Immunologic: Negative.   Neurological: Negative.   Hematological: Negative.   Psychiatric/Behavioral: Negative.     Social History   Socioeconomic History  . Marital status: Married    Spouse name: Not on file  . Number of children: 0  . Years of  education: Not on file  . Highest education level: High school graduate  Occupational History  . 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 Financial resource strain: Not hard at all  . Food insecurity:    Worry: Never true    Inability: Never true  . Transportation needs:    Medical: No    Non-medical: No  Tobacco Use  . Smoking status: Never Smoker  . Smokeless tobacco: Never Used  Substance and Sexual Activity  . Alcohol use: No    Alcohol/week: 0.0 standard drinks  . Drug use: No  . Sexual activity: Never  Lifestyle  . Physical activity:    Days per week: 0 days    Minutes per session: 0 min  . Stress: Not at all  Relationships  . Social connections:    Talks on phone: More than three times a week    Gets together: Once a week    Attends religious service: More than 4 times per year    Active member of club or organization: No    Attends meetings of clubs or organizations: Never    Relationship status: Married  . Intimate partner violence:    Fear of current or ex partner: No    Emotionally abused: No    Physically abused: No    Forced sexual activity: No  Other Topics Concern  . Not on file  Social History Narrative  . Not on file    Past Medical History:  Diagnosis Date  . Arthritis   . Dysrhythmia   . HOH (hard of hearing)    AIDS  . Hyperlipidemia   . Hypertension    RESTARTED BP MED SINCE FIRST CATARACT  . Pain    RIGHT KNEE  . Skin cancer of forehead      Patient Active Problem List   Diagnosis Date Noted  . Hypertension 03/21/2016  . History of anemia 08/21/2015  . OP (osteoporosis) 08/21/2015  . Herpes zona 08/21/2015  . Breast calcifications on mammogram 08/16/2014  . Flutter-fibrillation ([place]) 02/09/2010  . Avitaminosis D 12/12/2008  . Menopausal and postmenopausal disorder 09/13/2008  . BP (high blood pressure) 11/02/2007  . Hypercholesteremia 11/02/2007    Past Surgical History:  Procedure  Laterality Date  . BREAST BIOPSY Right 08/22/2014   benign. Done by Dr Collene Schlichter  . BREAST EXCISIONAL BIOPSY Right 1972   benign  . CATARACT EXTRACTION W/PHACO Right 05/06/2017   Procedure: CATARACT EXTRACTION PHACO AND INTRAOCULAR LENS PLACEMENT (IOC);  Surgeon: Birder Robson, MD;  Location: [facility] ORS;  Service: Ophthalmology;  Laterality: Right;  [place] 00:40 AP% 17.6 CDE 7.16 Fluid pack lot # [id number]  . CATARACT EXTRACTION W/PHACO Left 09/02/2017   Procedure: CATARACT EXTRACTION PHACO AND INTRAOCULAR LENS PLACEMENT ([place]);  Surgeon: Birder Robson, MD;  Location: [facility] ORS;  Service: Ophthalmology;  Laterality: Left;  [place] 00:39 AP% 15.0 CDE 5.91 Fluid pack lot # [id number]  . CHOLECYSTECTOMY  2013  . COLONOSCOPY N/A 02/06/2015   Procedure: COLONOSCOPY;  Surgeon: Hulen Luster, MD;  Location[facility];  Service: Gastroenterology;  Laterality: N/A;  . HYSTEROSCOPY  1999    Her family history includes Breast cancer (age of  onset: 65) in her sister; Heart attack in her brother; Heart disease in her father; Hypertension in her father, mother, and sister; Stroke in her mother.   Current Outpatient Medications:  .  aspirin 81 MG tablet, Take 81 mg at bedtime by mouth. , Disp: , Rfl:  .  hydrochlorothiazide (HYDRODIURIL) 25 MG tablet, TAKE 1 TABLET BY MOUTH ONCE A DAY, Disp: 90 tablet, Rfl: 3 .  losartan (COZAAR) 50 MG tablet, TAKE 1 TABLET BY MOUTH ONCE A DAY, Disp: 90 tablet, Rfl: 2 .  lovastatin (MEVACOR) 10 MG tablet, TAKE 1 TABLET BY MOUTH DAILY AT BEDTIME, Disp: 90 tablet, Rfl: 3 .  Polyvinyl Alcohol-Povidone (REFRESH OP), Place 1 drop 5 (five) times daily as needed into both eyes (DRY EYES)., Disp: , Rfl:  .  Vitamin D, Cholecalciferol, 1000 UNITS TABS, Take 1 tablet by mouth daily., Disp: , Rfl:  .  predniSONE (DELTASONE) 10 MG tablet, Take 51m PO daily x5 days, then 379mdaily for 5 days, then 2075maily for 5 days, then 70m50mily for 5 days (Patient not taking: Reported on 03/08/2019),  Disp: 50 tablet, Rfl: 0  Patient Care Team: GilbJerrol BananaD as PCP - General (Family Medicine) PorfBirder Robson as Referring Physician (Ophthalmology)    Objective:    Vitals: BP (!) 160/67   Pulse 80   Temp 97.8 F (36.6 C) (Oral)   Resp 16   Ht _0  (1.6 m)   Wt 106 lb 3.2 oz (48.2 kg)   BMI 18.81 kg/m   Physical Exam Vitals signs reviewed.  Constitutional:      Appearance: She is well-developed.  HENT:     Head: Normocephalic and atraumatic.     Right Ear: External ear normal.     Left Ear: External ear normal.     Nose: Nose normal.  Eyes:     General: No scleral icterus.    Conjunctiva/sclera: Conjunctivae normal.  Neck:     Thyroid: No thyromegaly.  Cardiovascular:     Rate and Rhythm: Normal rate and regular rhythm.     Heart sounds: Normal heart sounds.  Pulmonary:     Effort: Pulmonary effort is normal.     Breath sounds: Normal breath sounds.  Chest:     Breasts:        Right: Normal.        Left: Normal.  Abdominal:     Palpations: Abdomen is soft.  Musculoskeletal:        General: No tenderness.     Right lower leg: No edema.     Left lower leg: No edema.  Lymphadenopathy:     Cervical: No cervical adenopathy.  Skin:    General: Skin is warm and dry.  Neurological:     General: No focal deficit present.     Mental Status: She is alert and oriented to person, place, and time.  Psychiatric:        Mood and Affect: Mood normal.        Behavior: Behavior normal.        Thought Content: Thought content normal.        Judgment: Judgment normal.     Activities of Daily Living In your present state of health, do you have any difficulty performing the following activities: 11/10/2018  Hearing? Y  Comment Wears bilateral hearing aids.  Vision? N  Difficulty concentrating or making decisions? N  Walking or climbing stairs? Y  Comment Due to right knee  pain.   Dressing or bathing? N  Doing errands, shopping? N  Preparing Food  and eating ? N  Using the Toilet? N  In the past six months, have you accidently leaked urine? N  Do you have problems with loss of bowel control? N  Managing your Medications? N  Managing your Finances? N  Housekeeping or managing your Housekeeping? N  Some recent data might be hidden    Fall Risk Assessment Fall Risk  11/10/2018 11/10/2018 09/15/2017 08/27/2016 03/25/2016  Falls in the past year? 0 0 No No No     Depression Screen PHQ 2/9 Scores 11/10/2018 11/10/2018 11/10/2018 09/15/2017  PHQ - 2 Score 0 0 0 0  PHQ- 9 Score 0 - - 0    6CIT Screen 11/10/2018  What Year? 0 points  What month? 0 points  What time? 0 points  Count back from 20 0 points  Months in reverse 0 points  Repeat phrase 0 points  Total Score 0      Assessment & Plan:     Annual Wellness Visit/physical  Reviewed patient's Family Medical History Reviewed and updated list of patient's medical providers Assessment of cognitive impairment was done Assessed patient's functional ability Established a written schedule for health screening [place] Completed and Reviewed  Exercise Activities and Dietary recommendations Goals    . DIET - INCREASE WATER INTAKE     Recommend increasing water intake to 4-6 glasses a day.        Immunization History  Administered Date(s) Administered  . Influenza, High Dose Seasonal PF 06/10/2015, 06/14/2016, 07/01/2017, 07/02/2018  . Pneumococcal Conjugate-13 01/16/2015  . Pneumococcal Polysaccharide-23 08/27/2004  . Tdap 05/27/2011  . Zoster 07/06/2013    Health Maintenance  Topic Date Due  . INFLUENZA VACCINE  05/01/2019  . TETANUS/TDAP  05/26/2021  . DEXA SCAN  05/16/2022  . PNA vac Low Risk Adult  Completed     Discussed health benefits of physical activity, and encouraged her to engage in regular exercise appropriate for her age and condition. Last colonoscopy 2016. Pt declines pelvic or DRE. 1. Encounter for annual physical exam    2. Bilateral shoulder pain, unspecified chronicity Rule out PMR. - Sed Rate (ESR)  3. Hypercholesteremia Patient on Mevacor.  We will have the discussion in the next year whether it is time to stop it in an 78 year old. - TSH  4. Essential hypertension Controlled on HCTZ - CBC with Differential/Platelet - Comprehensive metabolic panel  5. Avitaminosis D Check level in patient with risk factors for osteoporosis - VITAMIN D 25 Hydroxy (Vit-D Deficiency, Fractures)   ------------------------------------------------------------------------------------------------------------    [name], MD  [facility] Clint Bolder as a scribe for [name], MD.,have documented all relevant documentation on the behalf of [name], MD,as directed by  [name], MD while in the presence of [name], MD.

## 2019-03-09 LAB — COMPREHENSIVE METABOLIC PANEL
ALT: 11 IU/L (ref 0–32)
AST: 27 IU/L (ref 0–40)
Albumin/Globulin Ratio: 1.6 (ref 1.2–2.2)
Albumin: 4.4 g/dL (ref 3.7–4.7)
Alkaline Phosphatase: 54 IU/L (ref 39–117)
BUN/Creatinine Ratio: 20 (ref 12–28)
BUN: 21 mg/dL (ref 8–27)
Bilirubin Total: 0.5 mg/dL (ref 0.0–1.2)
CO2: 25 mmol/L (ref 20–29)
Calcium: 9.6 mg/dL (ref 8.7–10.3)
Chloride: 98 mmol/L (ref 96–106)
Creatinine, Ser: 1.05 mg/dL — ABNORMAL HIGH (ref 0.57–1.00)
GFR calc Af Amer: 58 mL/min/{1.73_m2} — ABNORMAL LOW (ref >59)
GFR calc non Af Amer: 50 mL/min/{1.73_m2} — ABNORMAL LOW (ref >59)
Globulin, Total: 2.7 g/dL (ref 1.5–4.5)
Glucose: 108 mg/dL — ABNORMAL HIGH (ref 65–99)
Potassium: 3.8 mmol/L (ref 3.5–5.2)
Sodium: 137 mmol/L (ref 134–144)
Total Protein: 7.1 g/dL (ref 6.0–8.5)

## 2019-03-09 LAB — CBC WITH DIFFERENTIAL/PLATELET
Basophils Absolute: 0 10*3/uL (ref 0.0–0.2)
Basos: 1 %
EOS (ABSOLUTE): 0.1 10*3/uL (ref 0.0–0.4)
Eos: 1 %
Hematocrit: 34.1 % (ref 34.0–46.6)
Hemoglobin: 11 g/dL — ABNORMAL LOW (ref 11.1–15.9)
Immature Grans (Abs): 0 10*3/uL (ref 0.0–0.1)
Immature Granulocytes: 0 %
Lymphocytes Absolute: 1.4 10*3/uL (ref 0.7–3.1)
Lymphs: 30 %
MCH: 29.5 pg (ref 26.6–33.0)
MCHC: 32.3 g/dL (ref 31.5–35.7)
MCV: 91 fL (ref 79–97)
Monocytes Absolute: 0.4 10*3/uL (ref 0.1–0.9)
Monocytes: 8 %
Neutrophils Absolute: 2.9 10*3/uL (ref 1.4–7.0)
Neutrophils: 60 %
Platelets: 216 10*3/uL (ref 150–450)
RBC: 3.73 x10E6/uL — ABNORMAL LOW (ref 3.77–5.28)
RDW: 13.6 % (ref 11.7–15.4)
WBC: 4.8 10*3/uL (ref 3.4–10.8)

## 2019-03-09 LAB — TSH: TSH: 3.84 u[IU]/mL (ref 0.450–4.500)

## 2019-03-09 LAB — SEDIMENTATION RATE: Sed Rate: 22 mm/hr (ref 0–40)

## 2019-03-09 LAB — VITAMIN D 25 HYDROXY (VIT D DEFICIENCY, FRACTURES): Vit D, 25-Hydroxy: 45 ng/mL (ref 30.0–100.0)

## 2019-03-10 ENCOUNTER — Telehealth: Payer: Self-pay | Admitting: Family Medicine

## 2019-03-10 ENCOUNTER — Other Ambulatory Visit: Payer: Self-pay | Admitting: Family Medicine

## 2019-03-10 DIAGNOSIS — I10 Essential (primary) hypertension: Secondary | ICD-10-CM

## 2019-03-10 NOTE — Telephone Encounter (Signed)
done

## 2019-03-10 NOTE — Telephone Encounter (Signed)
Pt wants to add [facility] as her pharmacy and take off [facility].  She was wants to remove prednisone.  She no longer takes this.  Thanks [employer]

## 2019-03-10 NOTE — Telephone Encounter (Signed)
Pt restated that she does not want to removed [place]  Just [facility].  Con [place]

## 2019-03-11 ENCOUNTER — Telehealth: Payer: Self-pay

## 2019-03-11 NOTE — Telephone Encounter (Signed)
-----   Message from Jerrol Banana., MD sent at 03/11/2019  8:10 AM EDT ----- Labs okay.

## 2019-03-11 NOTE — Telephone Encounter (Signed)
Patient was advised.  

## 2019-04-27 ENCOUNTER — Encounter: Payer: Medicare HMO | Admitting: Family Medicine

## 2019-06-14 DIAGNOSIS — H35371 Puckering of macula, right eye: Secondary | ICD-10-CM | POA: Diagnosis not present

## 2019-06-21 DIAGNOSIS — X32XXXA Exposure to sunlight, initial encounter: Secondary | ICD-10-CM | POA: Diagnosis not present

## 2019-06-21 DIAGNOSIS — D485 Neoplasm of uncertain behavior of skin: Secondary | ICD-10-CM | POA: Diagnosis not present

## 2019-06-21 DIAGNOSIS — Z85828 Personal history of other malignant neoplasm of skin: Secondary | ICD-10-CM | POA: Diagnosis not present

## 2019-06-21 DIAGNOSIS — D225 Melanocytic nevi of trunk: Secondary | ICD-10-CM | POA: Diagnosis not present

## 2019-06-21 DIAGNOSIS — D2262 Melanocytic nevi of left upper limb, including shoulder: Secondary | ICD-10-CM | POA: Diagnosis not present

## 2019-06-21 DIAGNOSIS — L720 Epidermal cyst: Secondary | ICD-10-CM | POA: Diagnosis not present

## 2019-06-21 DIAGNOSIS — L570 Actinic keratosis: Secondary | ICD-10-CM | POA: Diagnosis not present

## 2019-06-21 DIAGNOSIS — Z08 Encounter for follow-up examination after completed treatment for malignant neoplasm: Secondary | ICD-10-CM | POA: Diagnosis not present

## 2019-06-23 ENCOUNTER — Other Ambulatory Visit: Payer: Self-pay

## 2019-06-23 ENCOUNTER — Ambulatory Visit (INDEPENDENT_AMBULATORY_CARE_PROVIDER_SITE_OTHER): Payer: Medicare HMO

## 2019-06-23 DIAGNOSIS — H524 Presbyopia: Secondary | ICD-10-CM | POA: Diagnosis not present

## 2019-06-23 DIAGNOSIS — Z23 Encounter for immunization: Secondary | ICD-10-CM | POA: Diagnosis not present

## 2019-08-04 ENCOUNTER — Other Ambulatory Visit: Payer: Self-pay

## 2019-08-04 ENCOUNTER — Telehealth: Payer: Self-pay

## 2019-08-04 ENCOUNTER — Ambulatory Visit (INDEPENDENT_AMBULATORY_CARE_PROVIDER_SITE_OTHER): Payer: Medicare HMO | Admitting: Family Medicine

## 2019-08-04 ENCOUNTER — Encounter: Payer: Self-pay | Admitting: Family Medicine

## 2019-08-04 VITALS — BP 130/69 | HR 83 | Temp 97.3°F | Resp 18 | Ht 63.0 in | Wt 107.4 lb

## 2019-08-04 DIAGNOSIS — R69 Illness, unspecified: Secondary | ICD-10-CM | POA: Diagnosis not present

## 2019-08-04 DIAGNOSIS — W57XXXA Bitten or stung by nonvenomous insect and other nonvenomous arthropods, initial encounter: Secondary | ICD-10-CM

## 2019-08-04 DIAGNOSIS — L309 Dermatitis, unspecified: Secondary | ICD-10-CM

## 2019-08-04 MED ORDER — PREDNISONE 5 MG PO TABS: ORAL_TABLET | ORAL | 0 refills | Status: DC

## 2019-08-04 MED ORDER — LORATADINE 10 MG PO TABS: 10.0000 mg | ORAL_TABLET | Freq: Every day | ORAL | 11 refills | Status: DC

## 2019-08-04 NOTE — Telephone Encounter (Signed)
Put her in at 3:20--thx

## 2019-08-04 NOTE — Progress Notes (Signed)
Patient: Morgan Ponce Female    DOB: [birth year]   78 y.o.   MRN: [id number] Visit Date: 08/04/2019  Today's Provider: [name], MD   Chief Complaint  Patient presents with  . Rash  . Facial Swelling   Subjective:     Rash This is a new problem. The current episode started yesterday. The problem has been gradually worsening since onset. The affected locations include the right eye, left eye and face. The rash is characterized by redness and swelling. She was exposed to an insect bite/sting. Associated symptoms include facial edema. Past treatments include anti-itch cream. The treatment provided no relief.  She was working in her yard and had gloves on but was touching her face when her hands when she suddenly felt itching on her face.  The swelling started shortly thereafter.  She is worried about the swelling getting into her eyes.  Allergies  Allergen Reactions  . Cardizem [Diltiazem] Rash  . Diltiazem Hcl Rash     Current Outpatient Medications:  .  aspirin 81 MG tablet, Take 81 mg at bedtime by mouth. , Disp: , Rfl:  .  hydrochlorothiazide (HYDRODIURIL) 25 MG tablet, TAKE 1 TABLET BY MOUTH ONCE A DAY, Disp: 90 tablet, Rfl: 3 .  losartan (COZAAR) 50 MG tablet, TAKE 1 TABLET BY MOUTH ONCE A DAY, Disp: 90 tablet, Rfl: 2 .  lovastatin (MEVACOR) 10 MG tablet, TAKE 1 TABLET BY MOUTH DAILY AT BEDTIME, Disp: 90 tablet, Rfl: 3 .  Polyvinyl Alcohol-Povidone (REFRESH OP), Place 1 drop 5 (five) times daily as needed into both eyes (DRY EYES)., Disp: , Rfl:  .  Vitamin D, Cholecalciferol, 1000 UNITS TABS, Take 1 tablet by mouth daily., Disp: , Rfl:   Review of Systems  Constitutional: Negative.   HENT: Negative.   Eyes: Negative.   Respiratory: Negative.   Cardiovascular: Negative for chest pain, palpitations and leg swelling.  Endocrine: Negative.   Musculoskeletal: Negative for arthralgias and myalgias.  Skin: Positive for rash.  Allergic/Immunologic: Negative for  environmental allergies.  Neurological: Negative for dizziness, light-headedness and headaches.  Psychiatric/Behavioral: Negative.     Social History   Tobacco Use  . Smoking status: Never Smoker  . Smokeless tobacco: Never Used  Substance Use Topics  . Alcohol use: No    Alcohol/week: 0.0 standard drinks      Objective:   BP 130/69   Pulse 83   Temp (!) 97.3 F (36.3 C) (Temporal)   Resp 18   Ht 5\' 3"  (1.6 m)   Wt 107 lb 6.4 oz (48.7 kg)   BMI 19.03 kg/m  Vitals:   08/04/19 1525  BP: 130/69  Pulse: 83  Resp: 18  Temp: (!) 97.3 F (36.3 C)  TempSrc: Temporal  Weight: 107 lb 6.4 oz (48.7 kg)  Height: 5\' 3"  (1.6 m)  Body mass index is 19.03 kg/m.   Physical Exam Vitals signs reviewed.  Constitutional:      Appearance: Normal appearance.  HENT:     Head: Normocephalic and atraumatic.     Right Ear: External ear normal.     Left Ear: External ear normal.     Nose: Nose normal.     Mouth/Throat:     Pharynx: Oropharynx is clear.  Eyes:     General: No scleral icterus.    Conjunctiva/sclera: Conjunctivae normal.  Cardiovascular:     Rate and Rhythm: Regular rhythm.     Heart sounds: Normal heart sounds.  Pulmonary:     Breath sounds: Normal breath sounds.  Lymphadenopathy:     Cervical: No cervical adenopathy.  Skin:    General: Skin is warm and dry.     Findings: Lesion present.     Comments: She has multiple areas on her upper face that appear to be bug bites.  They are on the cheeks and under and above the eyes.  There is mild erythema and swelling.  No drainage and no sign of secondary infection.  Neurological:     General: No focal deficit present.     Mental Status: She is alert and oriented to person, place, and time.  Psychiatric:        Mood and Affect: Mood normal.        Behavior: Behavior normal.        Thought Content: Thought content normal.        Judgment: Judgment normal.      No results found for any visits on 08/04/19.      Assessment & Plan    1. Dermatitis Will use the prednisone if any couple days this is getting worse. - predniSONE (DELTASONE) 5 MG tablet; Taper dose for 6 days (6 tablets x 1 day, 5 tab x 1 day, 4 tab x 1 day, 3 tab x 1 day, 2 tab x 1 day, 1 tab x 1 day)  Dispense: 21 tablet; Refill: 0 - loratadine (CLARITIN) 10 MG tablet; Take 1 tablet (10 mg total) by mouth daily.  Dispense: 30 tablet; Refill: 11  2. Insect bite, unspecified site, initial encounter Will cover for antihistamines for the itching.  I think this will be a self-limiting problem.  This is not anywhere near or affecting her airway. - predniSONE (DELTASONE) 5 MG tablet; Taper dose for 6 days (6 tablets x 1 day, 5 tab x 1 day, 4 tab x 1 day, 3 tab x 1 day, 2 tab x 1 day, 1 tab x 1 day)  Dispense: 21 tablet; Refill: 0 - loratadine (CLARITIN) 10 MG tablet; Take 1 tablet (10 mg total) by mouth daily.  Dispense: 30 tablet; Refill: [place], MD  [facility]

## 2019-08-04 NOTE — Telephone Encounter (Signed)
Patient called office with concern of possible insect bite to her face, patient states that she was working in garden yesterday and believes that she was bitten by an insect, patient states that rash appears to be spreading to her eyes and she is requesting that Dr. Rosanna Randy see her today. Dr. Rosanna Randy did not have any openings this afternoon. Please review if patient can be fit into scheduled. Patient call back number is [phone]. KW

## 2019-08-04 NOTE — Telephone Encounter (Signed)
Called and spoke with patient. She stated that she is having swelling in her face from multiple insect bite that is spreading to both sides of her face. She stated she is not having any swelling of the tongue or throat or any trouble breathing, no vision changes. She does not have anyone to bring her to the appointment so she did not want to take a benadryl before coming in to the clinic at 3:20pm. She was advised if her symptoms worsen to not hesitate to call 911 and go to the ED. She gave verbal understanding.

## 2019-08-09 ENCOUNTER — Ambulatory Visit: Payer: Medicare HMO | Admitting: Family Medicine

## 2019-09-06 ENCOUNTER — Other Ambulatory Visit: Payer: Self-pay | Admitting: Family Medicine

## 2019-09-06 DIAGNOSIS — Z1231 Encounter for screening mammogram for malignant neoplasm of breast: Secondary | ICD-10-CM

## 2019-09-21 ENCOUNTER — Other Ambulatory Visit: Payer: Self-pay | Admitting: Family Medicine

## 2019-09-21 DIAGNOSIS — L309 Dermatitis, unspecified: Secondary | ICD-10-CM

## 2019-09-21 DIAGNOSIS — W57XXXA Bitten or stung by nonvenomous insect and other nonvenomous arthropods, initial encounter: Secondary | ICD-10-CM

## 2019-09-21 NOTE — Telephone Encounter (Signed)
Requested medication (s) are due for refill today: no  Last refill:  08/04/2019  Future visit scheduled: yes  Notes to clinic: This refill cannot be delegated    Requested Prescriptions  Pending Prescriptions Disp Refills   predniSONE (DELTASONE) 5 MG tablet [Pharmacy Med Name: PREDNISONE 5 MG TABLET] 21 tablet 0    Sig: Taper dose for 6 days (6 tablets x 1 day, 5 tab x 1 day, 4 tab x 1 day, 3 tab x 1 day, 2 tab x 1 day, 1 tab x 1 day)      Not Delegated - Endocrinology:  Oral Corticosteroids Failed - 09/21/2019  2:05 AM      Failed - This refill cannot be delegated      Passed - Last BP in normal range    BP Readings from Last 1 Encounters:  08/04/19 130/69          Passed - Valid encounter within last 6 months    Recent Outpatient Visits           1 month ago [place] Jerrol Banana., MD   6 months ago Encounter for annual physical exam   [facility] Jerrol Banana., MD   1 year ago Irritant contact dermatitis due to plants, except food   [facility] [place], Dionne Bucy, MD   1 year ago Essential hypertension   [facility] Jerrol Banana., MD   1 year ago Encounter for annual physical exam   [facility] Jerrol Banana., MD       Future Appointments             In 9 month  [facility], [place]   In 1 month Jerrol Banana., MD [facility], [place]

## 2019-10-20 ENCOUNTER — Ambulatory Visit
Admission: RE | Admit: 2019-10-20 | Discharge: 2019-10-20 | Disposition: A | Discharge status: Home / Self Care | Payer: Medicare HMO | Source: Ambulatory Visit | Attending: Family Medicine | Admitting: Family Medicine

## 2019-10-20 DIAGNOSIS — Z1231 Encounter for screening mammogram for malignant neoplasm of breast: Secondary | ICD-10-CM | POA: Insufficient documentation

## 2019-11-15 NOTE — Progress Notes (Signed)
Patient: Morgan Ponce, Female    DOB: 1941-04-03, 79 y.o.   MRN: [id number] Visit Date: 11/16/2019  Today's Provider: [name], MD   Chief Complaint  Patient presents with  . Annual Exam   Subjective:     Patient had AWV with NHA at 10:00 today.   Complete Physical Morgan Ponce is a 79 y.o. female. She feels well. She reports exercising-some light exerxises. She reports she is sleeping well. She has had both Covid vaccines. She is coping well with the death of her husband.  -------------------------------------------------------  Colonoscopy: 02/06/2015 Mammogram: 10/20/2019  Review of Systems  Constitutional: Negative.   HENT: Negative.   Eyes: Negative.   Respiratory: Negative.   Gastrointestinal: Positive for blood in stool.  Endocrine: Negative.   Genitourinary: Negative.   Musculoskeletal: Positive for arthralgias.  Skin: Negative.   Allergic/Immunologic: Negative.   Neurological: Negative.   Hematological: Negative.   Psychiatric/Behavioral: Negative.      Social History   Socioeconomic History  . Marital status: Widowed    Spouse name: Not on file  . Number of children: 0  . Years of education: Not on file  . Highest education level: High school graduate  Occupational History  . Occupation: retired  Tobacco Use  . Smoking status: Never Smoker  . Smokeless tobacco: Never Used  Substance and Sexual Activity  . Alcohol use: No    Alcohol/week: 0.0 standard drinks  . Drug use: No  . Sexual activity: Never  Other Topics Concern  . Not on file  Social History Narrative  . Not on file   Social Determinants of Health   Financial Resource Strain: Low Risk   . Difficulty of Paying Living Expenses: Not hard at all  Food Insecurity: No Food Insecurity  . Worried About Charity fundraiser in the Last Year: Never true  . Ran Out of Food in the Last Year: Never true  Transportation Needs: No Transportation Needs  . Lack of  Transportation (Medical): No  . Lack of Transportation (Non-Medical): No  Physical Activity:   . Days of Exercise per Week: Not on file  . Minutes of Exercise per Session: Not on file  Stress: No Stress Concern Present  . Feeling of Stress : Not at all  Social Connections: Somewhat Isolated  . Frequency of Communication with Friends and Family: More than three times a week  . Frequency of Social Gatherings with Friends and Family: Three times a week  . Attends Religious Services: More than 4 times per year  . Active Member of Clubs or Organizations: No  . Attends Archivist Meetings: Never  . Marital Status: Widowed  Intimate Partner Violence: Not At Risk  . Fear of Current or Ex-Partner: No  . Emotionally Abused: No  . Physically Abused: No  . Sexually Abused: No    Past Medical History:  Diagnosis Date  . Arthritis   . Dysrhythmia   . HOH (hard of hearing)    AIDS  . Hyperlipidemia   . Hypertension    RESTARTED BP MED SINCE FIRST CATARACT  . Pain    RIGHT KNEE  . Skin cancer of forehead      Patient Active Problem List   Diagnosis Date Noted  . Hypertension 03/21/2016  . History of anemia 08/21/2015  . OP (osteoporosis) 08/21/2015  . Herpes zona 08/21/2015  . Breast calcifications on mammogram 08/16/2014  . Flutter-fibrillation ([place]) 02/09/2010  . Avitaminosis D  12/12/2008  . Menopausal and postmenopausal disorder 09/13/2008  . BP (high blood pressure) 11/02/2007  . Hypercholesteremia 11/02/2007    Past Surgical History:  Procedure Laterality Date  . BREAST BIOPSY Right 08/22/2014   benign. Done by Dr Collene Schlichter  . BREAST EXCISIONAL BIOPSY Right 1972   benign  . CATARACT EXTRACTION W/PHACO Right 05/06/2017   Procedure: CATARACT EXTRACTION PHACO AND INTRAOCULAR LENS PLACEMENT (IOC);  Surgeon: Birder Robson, MD;  Location: [facility] ORS;  Service: Ophthalmology;  Laterality: Right;  [place] 00:40 AP% 17.6 CDE 7.16 Fluid pack lot # [id number]  . CATARACT  EXTRACTION W/PHACO Left 09/02/2017   Procedure: CATARACT EXTRACTION PHACO AND INTRAOCULAR LENS PLACEMENT ([place]);  Surgeon: Birder Robson, MD;  Location: [facility] ORS;  Service: Ophthalmology;  Laterality: Left;  [place] 00:39 AP% 15.0 CDE 5.91 Fluid pack lot # [id number]  . CHOLECYSTECTOMY  2013  . COLONOSCOPY N/A 02/06/2015   Procedure: COLONOSCOPY;  Surgeon: Hulen Luster, MD;  Location[facility] ENDOSCOPY;  Service: Gastroenterology;  Laterality: N/A;  . HYSTEROSCOPY  1999    Her family history includes Breast cancer (age of onset: 15) in her sister; Heart attack in her brother; Heart disease in her father; Hypertension in her father, mother, and sister; Stroke in her mother.   Current Outpatient Medications:  .  aspirin 81 MG tablet, Take 81 mg at bedtime by mouth. , Disp: , Rfl:  .  hydrochlorothiazide (HYDRODIURIL) 25 MG tablet, TAKE 1 TABLET BY MOUTH ONCE A DAY, Disp: 90 tablet, Rfl: 3 .  losartan (COZAAR) 50 MG tablet, TAKE 1 TABLET BY MOUTH ONCE A DAY, Disp: 90 tablet, Rfl: 2 .  lovastatin (MEVACOR) 10 MG tablet, TAKE 1 TABLET BY MOUTH DAILY AT BEDTIME, Disp: 90 tablet, Rfl: 3 .  Polyvinyl Alcohol-Povidone (REFRESH OP), Place 1 drop 5 (five) times daily as needed into both eyes (DRY EYES)., Disp: , Rfl:  .  predniSONE (DELTASONE) 5 MG tablet, TAPER DOSE FOR 6 DAYS (6 TABLETS X 1 DAY, 5 TAB X 1 DAY, 4 TAB X 1 DAY, 3 TAB X 1 DAY, 2 TAB X 1 DAY, 1 TAB X 1 DAY) (Patient not taking: Reported on 11/16/2019), Disp: 21 tablet, Rfl: 0 .  Vitamin D, Cholecalciferol, 1000 UNITS TABS, Take 1 tablet by mouth daily., Disp: , Rfl:  .  loratadine (CLARITIN) 10 MG tablet, Take 1 tablet (10 mg total) by mouth daily. (Patient not taking: Reported on 11/16/2019), Disp: 30 tablet, Rfl: 11  Patient Care Team: Jerrol Banana., MD as PCP - General (Family Medicine) Birder Robson, MD as Referring Physician (Ophthalmology) Dasher, Rayvon Char, MD (Dermatology)     Objective:     Vitals: BP 148/74 (BP Location:  Right Arm)  Pulse 87  Temp 98.1 F (36.7 C) (Oral)  Ht 5\' 3"  (1.6 m)  Wt 104 lb 9.6 oz (47.4 kg)  BMI 18.53 kg/m BSA 1.45 m  Physical Exam Vitals reviewed.  Constitutional:      Appearance: She is well-developed.  HENT:     Head: Normocephalic and atraumatic.     Comments: Bilateral hearing aides.    Right Ear: External ear normal.     Left Ear: External ear normal.     Nose: Nose normal.  Eyes:     General: No scleral icterus.    Conjunctiva/sclera: Conjunctivae normal.  Neck:     Thyroid: No thyromegaly.     Vascular: No carotid bruit.  Cardiovascular:     Rate and Rhythm: Normal rate and regular  rhythm.     Heart sounds: Normal heart sounds.  Pulmonary:     Effort: Pulmonary effort is normal.     Breath sounds: Normal breath sounds.  Chest:     Breasts:        Right: Normal.        Left: Normal.  Abdominal:     Palpations: Abdomen is soft.  Musculoskeletal:        General: No tenderness.     Right lower leg: No edema.     Left lower leg: No edema.  Lymphadenopathy:     Cervical: No cervical adenopathy.  Skin:    General: Skin is warm and dry.     Comments: Very fair skin.  Neurological:     General: No focal deficit present.     Mental Status: She is alert and oriented to person, place, and time.  Psychiatric:        Mood and Affect: Mood normal.        Behavior: Behavior normal.        Thought Content: Thought content normal.        Judgment: Judgment normal.     Activities of Daily Living In your present state of health, do you have any difficulty performing the following activities: 11/16/2019  Hearing? N  Comment Wears bilateral hearing aids.  Vision? N  Difficulty concentrating or making decisions? N  Walking or climbing stairs? N  Dressing or bathing? N  Doing errands, shopping? N  Preparing Food and eating ? N  Using the Toilet? N  In the past six months, have you accidently leaked urine? N  Do you have problems with loss of bowel  control? N  Managing your Medications? N  Managing your Finances? N  Housekeeping or managing your Housekeeping? N  Some recent data might be hidden    Fall Risk Assessment Fall Risk  11/16/2019 11/10/2018 11/10/2018 09/15/2017 08/27/2016  Falls in the past year? 0 0 0 No No  Number falls in past yr: 0 - - - -  Injury with Fall? 0 - - - -     Depression Screen PHQ 2/9 Scores 11/10/2018 11/10/2018 11/10/2018 09/15/2017  PHQ - 2 Score 0 0 0 0  PHQ- 9 Score 0 - - 0    6CIT Screen 11/16/2019  What Year? 0 points  What month? 0 points  What time? 0 points  Count back from 20 0 points  Months in reverse 0 points  Repeat phrase 0 points  Total Score 0       Assessment & Plan:    Annual Physical Reviewed patient's Family Medical History Reviewed and updated list of patient's medical providers Assessment of cognitive impairment was done Assessed patient's functional ability Established a written schedule for health screening [place] Completed and Reviewed  Exercise Activities and Dietary recommendations Goals    . DIET - INCREASE WATER INTAKE     Recommend increasing water intake to 4-6 glasses a day.        Immunization History  Administered Date(s) Administered  . Fluad Quad(high Dose 65+) 06/23/2019  . Influenza, High Dose Seasonal PF 06/10/2015, 06/14/2016, 07/01/2017, 07/02/2018  . Pneumococcal Conjugate-13 01/16/2015  . Pneumococcal Polysaccharide-23 08/27/2004  . Tdap 05/27/2011  . Zoster 07/06/2013    Health Maintenance  Topic Date Due  . Samul Dada  05/26/2021  . DEXA SCAN  05/16/2022  . INFLUENZA VACCINE  Completed  . PNA vac Low Risk Adult  Completed  Discussed health benefits of physical activity, and encouraged her to engage in regular exercise appropriate for her age and condition.     1. Encounter for annual physical exam  - Lipid panel - TSH - Comprehensive Metabolic Panel (CMET) - CBC w/Diff/Platelet  2.  Essential hypertension RTC 6 months., - Lipid panel - TSH - Comprehensive Metabolic Panel (CMET) - CBC w/Diff/Platelet  3. Hypercholesteremia  - Lipid panel - TSH - Comprehensive Metabolic Panel (CMET) - CBC w/Diff/Platelet  4. Other osteoporosis without current pathological fracture  - Lipid panel - TSH - Comprehensive Metabolic Panel (CMET) - CBC w/Diff/Platelet  5. Osteoarthritis, unspecified osteoarthritis type, unspecified site  - Lipid panel - TSH - Comprehensive Metabolic Panel (CMET) - CBC w/Diff/Platelet   Follow up in 6 months.    I,Blas Riches,acting as a scribe for [name], MD.,have documented all relevant documentation on the behalf of [name], MD,as directed by  [name], MD while in the presence of [name], MD.    [name], MD  [facility]

## 2019-11-15 NOTE — Progress Notes (Addendum)
Subjective:   Morgan Ponce is a 79 y.o. female who presents for Medicare Annual (Subsequent) preventive examination.  Review of Systems:  N/A  Cardiac Risk Factors include: advanced age (>9men, >1 women);dyslipidemia;hypertension     Objective:     Vitals: BP (!) 152/70 (BP Location: Right Arm)   Pulse 87   Temp 98.1 F (36.7 C) (Oral)   Ht 5\' 3"  (1.6 m)   Wt 104 lb 9.6 oz (47.4 kg)   BMI 18.53 kg/m   Body mass index is 18.53 kg/m.  Advanced Directives 11/16/2019 11/10/2018 09/15/2017 09/02/2017 05/06/2017 04/21/2017 08/27/2016  Does Patient Have a Medical Advance Directive? Yes Yes Yes Yes Yes Yes Yes  Type of Paramedic of [place];Living will [place];Living will [place];Living will [place];Living will [place];Living will Living will;Healthcare Power of Attorney -  Does patient want to make changes to medical advance directive? - - - - No - Patient declined - -  Copy of [place] in Chart? No - copy requested No - copy requested No - copy requested No - copy requested No - copy requested - -    Tobacco Social History   Tobacco Use  Smoking Status Never Smoker  Smokeless Tobacco Never Used     Counseling given: Not Answered   Clinical Intake:  Pre-visit preparation completed: Yes  Pain : No/denies pain Pain Score: 0-No pain     Nutritional Status: BMI <19  Underweight Nutritional Risks: None Diabetes: No  How often do you need to have someone help you when you read instructions, pamphlets, or other written materials from your doctor or pharmacy?: 1 - Never  Interpreter Needed?: No  Information entered by :: [facility], LPN  Past Medical History:  Diagnosis Date  . Arthritis   . Dysrhythmia   . HOH (hard of hearing)    AIDS  . Hyperlipidemia   . Hypertension    RESTARTED BP MED SINCE FIRST CATARACT  . Pain    RIGHT KNEE  .  Skin cancer of forehead    Past Surgical History:  Procedure Laterality Date  . BREAST BIOPSY Right 08/22/2014   benign. Done by Dr Collene Schlichter  . BREAST EXCISIONAL BIOPSY Right 1972   benign  . CATARACT EXTRACTION W/PHACO Right 05/06/2017   Procedure: CATARACT EXTRACTION PHACO AND INTRAOCULAR LENS PLACEMENT (IOC);  Surgeon: Birder Robson, MD;  Location: [facility] ORS;  Service: Ophthalmology;  Laterality: Right;  [place] 00:40 AP% 17.6 CDE 7.16 Fluid pack lot # [id number]  . CATARACT EXTRACTION W/PHACO Left 09/02/2017   Procedure: CATARACT EXTRACTION PHACO AND INTRAOCULAR LENS PLACEMENT ([place]);  Surgeon: Birder Robson, MD;  Location: [facility] ORS;  Service: Ophthalmology;  Laterality: Left;  [place] 00:39 AP% 15.0 CDE 5.91 Fluid pack lot # [id number]  . CHOLECYSTECTOMY  2013  . COLONOSCOPY N/A 02/06/2015   Procedure: COLONOSCOPY;  Surgeon: Hulen Luster, MD;  Location: [facility] ENDOSCOPY;  Service: Gastroenterology;  Laterality: N/A;  . HYSTEROSCOPY  1999   Family History  Problem Relation Age of Onset  . Hypertension Mother   . Stroke Mother   . Heart disease Father   . Hypertension Father   . Hypertension Sister   . Breast cancer Sister 48  . Heart attack Brother    Social History   Socioeconomic History  . Marital status: Widowed    Spouse name: Not on file  . Number of children: 0  . Years of  education: Not on file  . Highest education level: High school graduate  Occupational History  . Occupation: retired  Tobacco Use  . Smoking status: Never Smoker  . Smokeless tobacco: Never Used  Substance and Sexual Activity  . Alcohol use: No    Alcohol/week: 0.0 standard drinks  . Drug use: No  . Sexual activity: Never  Other Topics Concern  . Not on file  Social History Narrative  . Not on file   Social Determinants of Health   Financial Resource Strain: Low Risk   . Difficulty of Paying Living Expenses: Not hard at all  Food Insecurity: No Food Insecurity  . Worried About Sales executive in the Last Year: Never true  . Ran Out of Food in the Last Year: Never true  Transportation Needs: No Transportation Needs  . Lack of Transportation (Medical): No  . Lack of Transportation (Non-Medical): No  Physical Activity:   . Days of Exercise per Week: Not on file  . Minutes of Exercise per Session: Not on file  Stress: No Stress Concern Present  . Feeling of Stress : Not at all  Social Connections: Somewhat Isolated  . Frequency of Communication with Friends and Family: More than three times a week  . Frequency of Social Gatherings with Friends and Family: Three times a week  . Attends Religious Services: More than 4 times per year  . Active Member of Clubs or Organizations: No  . Attends Archivist Meetings: Never  . Marital Status: Widowed    Outpatient Encounter Medications as of 11/16/2019  Medication Sig  . aspirin 81 MG tablet Take 81 mg at bedtime by mouth.   . hydrochlorothiazide (HYDRODIURIL) 25 MG tablet TAKE 1 TABLET BY MOUTH ONCE A DAY  . losartan (COZAAR) 50 MG tablet TAKE 1 TABLET BY MOUTH ONCE A DAY  . lovastatin (MEVACOR) 10 MG tablet TAKE 1 TABLET BY MOUTH DAILY AT BEDTIME  . Polyvinyl Alcohol-Povidone (REFRESH OP) Place 1 drop 5 (five) times daily as needed into both eyes (DRY EYES).  . predniSONE (DELTASONE) 5 MG tablet TAPER DOSE FOR 6 DAYS (6 TABLETS X 1 DAY, 5 TAB X 1 DAY, 4 TAB X 1 DAY, 3 TAB X 1 DAY, 2 TAB X 1 DAY, 1 TAB X 1 DAY) (Patient not taking: Reported on 11/16/2019)  . Vitamin D, Cholecalciferol, 1000 UNITS TABS Take 1 tablet by mouth daily.  Marland Kitchen loratadine (CLARITIN) 10 MG tablet Take 1 tablet (10 mg total) by mouth daily. (Patient not taking: Reported on 11/16/2019)   No facility-administered encounter medications on file as of 11/16/2019.    Activities of Daily Living In your present state of health, do you have any difficulty performing the following activities: 11/16/2019  Hearing? N  Comment Wears bilateral hearing aids.    Vision? N  Difficulty concentrating or making decisions? N  Walking or climbing stairs? N  Dressing or bathing? N  Doing errands, shopping? N  Preparing Food and eating ? N  Using the Toilet? N  In the past six months, have you accidently leaked urine? N  Do you have problems with loss of bowel control? N  Managing your Medications? N  Managing your Finances? N  Housekeeping or managing your Housekeeping? N  Some recent data might be hidden    Patient Care Team: Jerrol Banana., MD as PCP - General (Family Medicine) Birder Robson, MD as Referring Physician (Ophthalmology) Dasher, Rayvon Char, MD (Dermatology)  Assessment:   This is a routine wellness examination for [place].  Exercise Activities and Dietary recommendations Current Exercise Habits: The patient does not participate in regular exercise at present, Exercise limited by: None identified  Goals    . DIET - INCREASE WATER INTAKE     Recommend increasing water intake to 4-6 glasses a day.        Fall Risk: Fall Risk  11/16/2019 11/10/2018 11/10/2018 09/15/2017 08/27/2016  Falls in the past year? 0 0 0 No No  Number falls in past yr: 0 - - - -  Injury with Fall? 0 - - - -    FALL RISK PREVENTION PERTAINING TO THE HOME:  Any stairs in or around the home? Yes  If so, are there any without handrails? No   Home free of loose throw rugs in walkways, pet beds, electrical cords, etc? Yes  Adequate lighting in your home to reduce risk of falls? Yes   ASSISTIVE DEVICES UTILIZED TO PREVENT FALLS:  Life alert? Yes  Use of a cane, walker or w/c? No  Grab bars in the bathroom? No  Shower chair or bench in shower? No  Elevated toilet seat or a handicapped toilet? No    TIMED UP AND GO:  Was the test performed? No .    Depression Screen PHQ 2/9 Scores 11/10/2018 11/10/2018 11/10/2018 09/15/2017  PHQ - 2 Score 0 0 0 0  PHQ- 9 Score 0 - - 0     Cognitive Function     6CIT Screen 11/16/2019 11/10/2018  08/27/2016  What Year? 0 points 0 points 0 points  What month? 0 points 0 points 0 points  What time? 0 points 0 points 0 points  Count back from 20 0 points 0 points 0 points  Months in reverse 0 points 0 points 0 points  Repeat phrase 0 points 0 points 2 points  Total Score 0 0 2    Immunization History  Administered Date(s) Administered  . Fluad Quad(high Dose 65+) 06/23/2019  . Influenza, High Dose Seasonal PF 06/10/2015, 06/14/2016, 07/01/2017, 07/02/2018  . Pneumococcal Conjugate-13 01/16/2015  . Pneumococcal Polysaccharide-23 08/27/2004  . Tdap 05/27/2011  . Zoster 07/06/2013    Qualifies for Shingles Vaccine? Yes  Zostavax completed 07/16/13. Due for Shingrix. Pt has been advised to call insurance company to determine out of pocket expense. Advised may also receive vaccine at local pharmacy or Health Dept. Verbalized acceptance and understanding.  Tdap: Up to date  Flu Vaccine: Up to date  Pneumococcal Vaccine: Completed series  Screening Tests Health Maintenance  Topic Date Due  . TETANUS/TDAP  05/26/2021  . DEXA SCAN  05/16/2022  . INFLUENZA VACCINE  Completed  . PNA vac Low Risk Adult  Completed    Cancer Screenings:  Colorectal Screening: No longer required.   Mammogram: No longer required.   Bone Density: Completed 05/16/17. Results reflect OSTEOPENIA. Repeat every 5 years.   Lung Cancer Screening: (Low Dose CT Chest recommended if Age 67-80 years, 30 pack-year currently smoking OR have quit w/in 15years.) does not qualify.   Additional Screening:  Vision Screening: Recommended annual ophthalmology exams for early detection of glaucoma and other disorders of the eye.  Dental Screening: Recommended annual dental exams for proper oral hygiene  Community Resource Referral:  CRR required this visit?  No       Plan:  I have personally reviewed and addressed the Medicare Annual Wellness questionnaire and have noted the following in the patient's chart:  A. Medical and social history B. Use of alcohol, tobacco or illicit drugs  C. Current medications and supplements D. Functional ability and status E.  Nutritional status F.  Physical activity G. Advance directives H. List of other physicians I.  Hospitalizations, surgeries, and ER visits in previous 12 months J.  [place] such as hearing and vision if needed, cognitive and depression L. Referrals and appointments   In addition, I have reviewed and discussed with patient certain preventive protocols, quality metrics, and best practice recommendations. A written personalized care plan for preventive services as well as general preventive health recommendations were provided to patient. Nurse Health Advisor  Signed,    Tiwanda Threats [place], [place]  [phone] Nurse Health Advisor   Nurse Notes: None.

## 2019-11-16 ENCOUNTER — Other Ambulatory Visit: Payer: Self-pay

## 2019-11-16 ENCOUNTER — Ambulatory Visit (INDEPENDENT_AMBULATORY_CARE_PROVIDER_SITE_OTHER): Payer: Medicare HMO

## 2019-11-16 ENCOUNTER — Ambulatory Visit (INDEPENDENT_AMBULATORY_CARE_PROVIDER_SITE_OTHER): Payer: Medicare HMO | Admitting: Family Medicine

## 2019-11-16 VITALS — BP 152/70 | HR 87 | Temp 98.1°F | Ht 63.0 in | Wt 104.6 lb

## 2019-11-16 DIAGNOSIS — M1990 Unspecified osteoarthritis, unspecified site: Secondary | ICD-10-CM | POA: Diagnosis not present

## 2019-11-16 DIAGNOSIS — E7800 Pure hypercholesterolemia, unspecified: Secondary | ICD-10-CM

## 2019-11-16 DIAGNOSIS — M818 Other osteoporosis without current pathological fracture: Secondary | ICD-10-CM

## 2019-11-16 DIAGNOSIS — Z0000 Encounter for general adult medical examination without abnormal findings: Secondary | ICD-10-CM

## 2019-11-16 DIAGNOSIS — I10 Essential (primary) hypertension: Secondary | ICD-10-CM | POA: Diagnosis not present

## 2019-11-16 NOTE — Patient Instructions (Signed)
Morgan Ponce , Thank you for taking time to come for your Medicare Wellness Visit. I appreciate your ongoing commitment to your health goals. Please review the following plan we discussed and let me know if I can assist you in the future.   Screening recommendations/referrals: Colonoscopy: No longer required.  Mammogram: No longer required.  Bone Density: Up to date, due 04/2022 Recommended yearly ophthalmology/optometry visit for glaucoma screening and checkup Recommended yearly dental visit for hygiene and checkup  Vaccinations: Influenza vaccine: Up to date Pneumococcal vaccine: Completed series Tdap vaccine: Up to date, due 04/2021 Shingles vaccine: Pt declines today.     Advanced directives: Please bring a copy of your POA (Power of Attorney) and/or Living Will to your next appointment.   Conditions/risks identified: Recommend increasing water intake to 6-8 8 oz glasses a day.  Next appointment: 10:20 AM today with Dr Rosanna Randy    Preventive Care 81 Years and Older, Female Preventive care refers to lifestyle choices and visits with your health care provider that can promote health and wellness. What does preventive care include?  A yearly physical exam. This is also called an annual well check.  Dental exams once or twice a year.  Routine eye exams. Ask your health care provider how often you should have your eyes checked.  Personal lifestyle choices, including:  Daily care of your teeth and gums.  Regular physical activity.  Eating a healthy diet.  Avoiding tobacco and drug use.  Limiting alcohol use.  Practicing safe sex.  Taking low-dose aspirin every day.  Taking vitamin and mineral supplements as recommended by your health care provider. What happens during an annual well check? The services and screenings done by your health care provider during your annual well check will depend on your age, overall health, lifestyle risk factors, and family history of  disease. Counseling  Your health care provider may ask you questions about your:  Alcohol use.  Tobacco use.  Drug use.  Emotional well-being.  Home and relationship well-being.  Sexual activity.  Eating habits.  History of falls.  Memory and ability to understand (cognition).  Work and work Statistician.  Reproductive health. Screening  You may have the following tests or measurements:  Height, weight, and BMI.  Blood pressure.  Lipid and cholesterol levels. These may be checked every 5 years, or more frequently if you are over 57 years old.  Skin check.  Lung cancer screening. You may have this screening every year starting at age 17 if you have a 30-pack-year history of smoking and currently smoke or have quit within the past 15 years.  Fecal occult blood test (FOBT) of the stool. You may have this test every year starting at age 72.  Flexible sigmoidoscopy or colonoscopy. You may have a sigmoidoscopy every 5 years or a colonoscopy every 10 years starting at age 41.  Hepatitis C blood test.  Hepatitis B blood test.  Sexually transmitted disease (STD) testing.  Diabetes screening. This is done by checking your blood sugar (glucose) after you have not eaten for a while (fasting). You may have this done every 1-3 years.  Bone density scan. This is done to screen for osteoporosis. You may have this done starting at age 106.  Mammogram. This may be done every 1-2 years. Talk to your health care provider about how often you should have regular mammograms. Talk with your health care provider about your test results, treatment options, and if necessary, the need for more tests. Vaccines  Your health care provider may recommend certain vaccines, such as:  Influenza vaccine. This is recommended every year.  Tetanus, diphtheria, and acellular pertussis (Tdap, Td) vaccine. You may need a Td booster every 10 years.  Zoster vaccine. You may need this after age  74.  Pneumococcal 13-valent conjugate (PCV13) vaccine. One dose is recommended after age 19.  Pneumococcal polysaccharide (PPSV23) vaccine. One dose is recommended after age 2. Talk to your health care provider about which screenings and vaccines you need and how often you need them. This information is not intended to replace advice given to you by your health care provider. Make sure you discuss any questions you have with your health care provider. Document Released: 10/13/2015 Document Revised: 06/05/2016 Document Reviewed: 07/18/2015 Elsevier Interactive Patient Education  2017 [employer] Prevention in the Home Falls can cause injuries. They can happen to people of all ages. There are many things you can do to make your home safe and to help prevent falls. What can I do on the outside of my home?  Regularly fix the edges of walkways and driveways and fix any cracks.  Remove anything that might make you trip as you walk through a door, such as a raised step or threshold.  Trim any bushes or trees on the path to your home.  Use bright outdoor lighting.  Clear any walking paths of anything that might make someone trip, such as rocks or tools.  Regularly check to see if handrails are loose or broken. Make sure that both sides of any steps have handrails.  Any raised decks and porches should have guardrails on the edges.  Have any leaves, snow, or ice cleared regularly.  Use sand or salt on walking paths during winter.  Clean up any spills in your garage right away. This includes oil or grease spills. What can I do in the bathroom?  Use night lights.  Install grab bars by the toilet and in the tub and shower. Do not use towel bars as grab bars.  Use non-skid mats or decals in the tub or shower.  If you need to sit down in the shower, use a plastic, non-slip stool.  Keep the floor dry. Clean up any water that spills on the floor as soon as it happens.  Remove  soap buildup in the tub or shower regularly.  Attach bath mats securely with double-sided non-slip rug tape.  Do not have throw rugs and other things on the floor that can make you trip. What can I do in the bedroom?  Use night lights.  Make sure that you have a light by your bed that is easy to reach.  Do not use any sheets or blankets that are too big for your bed. They should not hang down onto the floor.  Have a firm chair that has side arms. You can use this for support while you get dressed.  Do not have throw rugs and other things on the floor that can make you trip. What can I do in the kitchen?  Clean up any spills right away.  Avoid walking on wet floors.  Keep items that you use a lot in easy-to-reach places.  If you need to reach something above you, use a strong step stool that has a grab bar.  Keep electrical cords out of the way.  Do not use floor polish or wax that makes floors slippery. If you must use wax, use non-skid floor wax.  Do  not have throw rugs and other things on the floor that can make you trip. What can I do with my stairs?  Do not leave any items on the stairs.  Make sure that there are handrails on both sides of the stairs and use them. Fix handrails that are broken or loose. Make sure that handrails are as long as the stairways.  Check any carpeting to make sure that it is firmly attached to the stairs. Fix any carpet that is loose or worn.  Avoid having throw rugs at the top or bottom of the stairs. If you do have throw rugs, attach them to the floor with carpet tape.  Make sure that you have a light switch at the top of the stairs and the bottom of the stairs. If you do not have them, ask someone to add them for you. What else can I do to help prevent falls?  Wear shoes that:  Do not have high heels.  Have rubber bottoms.  Are comfortable and fit you well.  Are closed at the toe. Do not wear sandals.  If you use a  stepladder:  Make sure that it is fully opened. Do not climb a closed stepladder.  Make sure that both sides of the stepladder are locked into place.  Ask someone to hold it for you, if possible.  Clearly mark and make sure that you can see:  Any grab bars or handrails.  First and last steps.  Where the edge of each step is.  Use tools that help you move around (mobility aids) if they are needed. These include:  Canes.  Walkers.  Scooters.  Crutches.  Turn on the lights when you go into a dark area. Replace any light bulbs as soon as they burn out.  Set up your furniture so you have a clear path. Avoid moving your furniture around.  If any of your floors are uneven, fix them.  If there are any pets around you, be aware of where they are.  Review your medicines with your doctor. Some medicines can make you feel dizzy. This can increase your chance of falling. Ask your doctor what other things that you can do to help prevent falls. This information is not intended to replace advice given to you by your health care provider. Make sure you discuss any questions you have with your health care provider. Document Released: 07/13/2009 Document Revised: 02/22/2016 Document Reviewed: 10/21/2014 Elsevier Interactive Patient Education  2017 [employer].

## 2019-11-17 LAB — CBC WITH DIFFERENTIAL/PLATELET
Basophils Absolute: 0 10*3/uL (ref 0.0–0.2)
Basos: 1 %
EOS (ABSOLUTE): 0.1 10*3/uL (ref 0.0–0.4)
Eos: 2 %
Hematocrit: 36 % (ref 34.0–46.6)
Hemoglobin: 11.8 g/dL (ref 11.1–15.9)
Immature Grans (Abs): 0 10*3/uL (ref 0.0–0.1)
Immature Granulocytes: 0 %
Lymphocytes Absolute: 1.1 10*3/uL (ref 0.7–3.1)
Lymphs: 21 %
MCH: 30.5 pg (ref 26.6–33.0)
MCHC: 32.8 g/dL (ref 31.5–35.7)
MCV: 93 fL (ref 79–97)
Monocytes Absolute: 0.5 10*3/uL (ref 0.1–0.9)
Monocytes: 9 %
Neutrophils Absolute: 3.4 10*3/uL (ref 1.4–7.0)
Neutrophils: 67 %
Platelets: 212 10*3/uL (ref 150–450)
RBC: 3.87 x10E6/uL (ref 3.77–5.28)
RDW: 13.3 % (ref 11.7–15.4)
WBC: 5.1 10*3/uL (ref 3.4–10.8)

## 2019-11-17 LAB — COMPREHENSIVE METABOLIC PANEL
ALT: 9 IU/L (ref 0–32)
AST: 27 IU/L (ref 0–40)
Albumin/Globulin Ratio: 1.4 (ref 1.2–2.2)
Albumin: 4.3 g/dL (ref 3.7–4.7)
Alkaline Phosphatase: 61 IU/L (ref 39–117)
BUN/Creatinine Ratio: 21 (ref 12–28)
BUN: 24 mg/dL (ref 8–27)
Bilirubin Total: 0.4 mg/dL (ref 0.0–1.2)
CO2: 26 mmol/L (ref 20–29)
Calcium: 10.1 mg/dL (ref 8.7–10.3)
Chloride: 98 mmol/L (ref 96–106)
Creatinine, Ser: 1.14 mg/dL — ABNORMAL HIGH (ref 0.57–1.00)
GFR calc Af Amer: 52 mL/min/{1.73_m2} — ABNORMAL LOW (ref >59)
GFR calc non Af Amer: 46 mL/min/{1.73_m2} — ABNORMAL LOW (ref >59)
Globulin, Total: 3.1 g/dL (ref 1.5–4.5)
Glucose: 99 mg/dL (ref 65–99)
Potassium: 4.1 mmol/L (ref 3.5–5.2)
Sodium: 139 mmol/L (ref 134–144)
Total Protein: 7.4 g/dL (ref 6.0–8.5)

## 2019-11-17 LAB — LIPID PANEL
Chol/HDL Ratio: 2.5 ratio (ref 0.0–4.4)
Cholesterol, Total: 172 mg/dL (ref 100–199)
HDL: 69 mg/dL (ref >39)
LDL Chol Calc (NIH): 87 mg/dL (ref 0–99)
Triglycerides: 87 mg/dL (ref 0–149)
VLDL Cholesterol Cal: 16 mg/dL (ref 5–40)

## 2019-11-17 LAB — TSH: TSH: 2.26 u[IU]/mL (ref 0.450–4.500)

## 2019-11-19 ENCOUNTER — Telehealth: Payer: Self-pay

## 2019-11-19 NOTE — Telephone Encounter (Signed)
Copied from [place] [id number]. Topic: General - Other >> Nov 19, 2019  2:08 PM Morgan Ponce wrote: Reason for CRM: Pt called and is requesting to know what to do to get rid of a bruise faster. Please advise.

## 2019-11-19 NOTE — Telephone Encounter (Addendum)
Called patient back for more information. Patient states she fell after standing up from the sofa last night and hit the left side of her face on a table. Patient states she has a small cut on her left eyebrow and a large bruise on left eye. Patient wantied to know what she can do to help with bruise on eye. Patient states she is not hurt anywhere else. Please advise.

## 2019-11-19 NOTE — Telephone Encounter (Signed)
Apply ice pack to control bruising. If cut bleeding significantly and the "eyeball" is injured, should go to the ER for sutures and x-ray evaluation of bones around the eye.

## 2019-11-19 NOTE — Telephone Encounter (Signed)
Patient was advised. Patient stated the cut on eyebrow did not bleed and her left "eyeball" looks and feels normal.

## 2019-11-22 ENCOUNTER — Other Ambulatory Visit: Payer: Self-pay

## 2019-11-22 ENCOUNTER — Ambulatory Visit (INDEPENDENT_AMBULATORY_CARE_PROVIDER_SITE_OTHER): Payer: Medicare HMO | Admitting: Family Medicine

## 2019-11-22 ENCOUNTER — Encounter: Payer: Self-pay | Admitting: Family Medicine

## 2019-11-22 VITALS — BP 149/74 | HR 91 | Temp 96.8°F | Wt 103.2 lb

## 2019-11-22 DIAGNOSIS — S0512XA Contusion of eyeball and orbital tissues, left eye, initial encounter: Secondary | ICD-10-CM

## 2019-11-22 DIAGNOSIS — W19XXXA Unspecified fall, initial encounter: Secondary | ICD-10-CM | POA: Diagnosis not present

## 2019-11-22 NOTE — Progress Notes (Signed)
Patient: Morgan Ponce Female    DOB: 10-Sep-1941   79 y.o.   MRN: [id number] Visit Date: 11/22/2019  Today's Provider: [name], MD   Chief Complaint  Patient presents with  . Fall   Subjective:     Fall The accident occurred 3 to 5 days ago. The fall occurred while walking (patient fell when getting up from couch). She landed on hard floor. The point of impact was the right knee, head and left elbow (left side of forehead, has black eye). Pertinent negatives include no headaches, loss of consciousness, numbness, tingling or visual change. She has tried ice for the symptoms.  She is having no concussion symptoms.  The pain is slowly getting better.  No visual disturbance.  Allergies  Allergen Reactions  . Cardizem [Diltiazem] Rash  . Diltiazem Hcl Rash     Current Outpatient Medications:  .  predniSONE (DELTASONE) 5 MG tablet, TAPER DOSE FOR 6 DAYS (6 TABLETS X 1 DAY, 5 TAB X 1 DAY, 4 TAB X 1 DAY, 3 TAB X 1 DAY, 2 TAB X 1 DAY, 1 TAB X 1 DAY) (Patient not taking: Reported on 11/16/2019), Disp: 21 tablet, Rfl: 0 .  aspirin 81 MG tablet, Take 81 mg at bedtime by mouth. , Disp: , Rfl:  .  hydrochlorothiazide (HYDRODIURIL) 25 MG tablet, TAKE 1 TABLET BY MOUTH ONCE A DAY, Disp: 90 tablet, Rfl: 3 .  loratadine (CLARITIN) 10 MG tablet, Take 1 tablet (10 mg total) by mouth daily. (Patient not taking: Reported on 11/16/2019), Disp: 30 tablet, Rfl: 11 .  losartan (COZAAR) 50 MG tablet, TAKE 1 TABLET BY MOUTH ONCE A DAY, Disp: 90 tablet, Rfl: 2 .  lovastatin (MEVACOR) 10 MG tablet, TAKE 1 TABLET BY MOUTH DAILY AT BEDTIME, Disp: 90 tablet, Rfl: 3 .  Polyvinyl Alcohol-Povidone (REFRESH OP), Place 1 drop 5 (five) times daily as needed into both eyes (DRY EYES)., Disp: , Rfl:  .  Vitamin D, Cholecalciferol, 1000 UNITS TABS, Take 1 tablet by mouth daily., Disp: , Rfl:   Review of Systems  Constitutional: Negative.   HENT: Positive for facial swelling.   Eyes: Positive for pain.   Respiratory: Negative.   Cardiovascular: Negative.   Gastrointestinal: Negative.   Endocrine: Negative.   Allergic/Immunologic: Negative.   Neurological: Negative for tingling, loss of consciousness, numbness and headaches.  Hematological: Negative.   Psychiatric/Behavioral: Negative.     Social History   Tobacco Use  . Smoking status: Never Smoker  . Smokeless tobacco: Never Used  Substance Use Topics  . Alcohol use: No    Alcohol/week: 0.0 standard drinks      Objective:   There were no vitals taken for this visit. There were no vitals filed for this visit.There is no height or weight on file to calculate BMI.   Physical Exam Vitals reviewed.  Constitutional:      Appearance: Normal appearance.  HENT:     Head: Normocephalic.  Neck:     Comments: Patient has no tenderness over cervical spine. She has ecchymosis around her left eye.  Full range of motion of the eye.  No visual disturbance.  No tenderness or instability of the bony structures around the eye. Neurological:     General: No focal deficit present.     Mental Status: She is alert and oriented to person, place, and time.  Psychiatric:        Mood and Affect: Mood normal.  Behavior: Behavior normal.        Thought Content: Thought content normal.        Judgment: Judgment normal.      No results found for any visits on 11/22/19.     Assessment & Plan     1. Contusion of left orbital tissues, initial encounter Patient doing well.  No imaging necessary.  No signs of concussion.  Advised her to now use moist heat on the area.  2. Fall, initial encounter She slipped off the couch.  Discussed limiting her fall risk.     Richard Cranford Mon, MD  [facility]

## 2019-12-06 ENCOUNTER — Other Ambulatory Visit: Payer: Self-pay | Admitting: Family Medicine

## 2019-12-06 NOTE — Telephone Encounter (Signed)
Requested Prescriptions  Pending Prescriptions Disp Refills  . lovastatin (MEVACOR) 10 MG tablet [Pharmacy Med Name: LOVASTATIN 10 MG TAB] 90 tablet 3    Sig: TAKE 1 TABLET BY MOUTH DAILY AT BEDTIME     Cardiovascular:  Antilipid - Statins Failed - 12/06/2019  3:42 PM      Failed - LDL in normal range and within 360 days    LDL Chol Calc (NIH)  Date Value Ref Range Status  11/16/2019 87 0 - 99 mg/dL Final         Passed - Total Cholesterol in normal range and within 360 days    Cholesterol, Total  Date Value Ref Range Status  11/16/2019 172 100 - 199 mg/dL Final         Passed - HDL in normal range and within 360 days    HDL  Date Value Ref Range Status  11/16/2019 69 >39 mg/dL Final         Passed - Triglycerides in normal range and within 360 days    Triglycerides  Date Value Ref Range Status  11/16/2019 87 0 - 149 mg/dL Final         Passed - Patient is not pregnant      Passed - Valid encounter within last 12 months    Recent Outpatient Visits          2 weeks ago Contusion of left orbital tissues, initial encounter   [facility] Jerrol Banana., MD   2 weeks ago Encounter for annual physical exam   [facility] Jerrol Banana., MD   4 months ago Dermatitis   [facility] Jerrol Banana., MD   9 months ago Encounter for annual physical exam   [facility] Jerrol Banana., MD   1 year ago Irritant contact dermatitis due to plants, except food   [facility], Dionne Bucy, MD      Future Appointments            In 5 months Jerrol Banana., MD [facility] [place]

## 2019-12-15 DIAGNOSIS — Z7982 Long term (current) use of aspirin: Secondary | ICD-10-CM | POA: Diagnosis not present

## 2019-12-15 DIAGNOSIS — Z888 Allergy status to other drugs, medicaments and biological substances status: Secondary | ICD-10-CM | POA: Diagnosis not present

## 2019-12-15 DIAGNOSIS — I10 Essential (primary) hypertension: Secondary | ICD-10-CM | POA: Diagnosis not present

## 2019-12-15 DIAGNOSIS — Z803 Family history of malignant neoplasm of breast: Secondary | ICD-10-CM | POA: Diagnosis not present

## 2019-12-15 DIAGNOSIS — E785 Hyperlipidemia, unspecified: Secondary | ICD-10-CM | POA: Diagnosis not present

## 2019-12-15 DIAGNOSIS — M810 Age-related osteoporosis without current pathological fracture: Secondary | ICD-10-CM | POA: Diagnosis not present

## 2019-12-15 DIAGNOSIS — Z85828 Personal history of other malignant neoplasm of skin: Secondary | ICD-10-CM | POA: Diagnosis not present

## 2019-12-15 DIAGNOSIS — Z8249 Family history of ischemic heart disease and other diseases of the circulatory system: Secondary | ICD-10-CM | POA: Diagnosis not present

## 2019-12-15 DIAGNOSIS — Z823 Family history of stroke: Secondary | ICD-10-CM | POA: Diagnosis not present

## 2019-12-15 DIAGNOSIS — Z833 Family history of diabetes mellitus: Secondary | ICD-10-CM | POA: Diagnosis not present

## 2020-02-22 DIAGNOSIS — R69 Illness, unspecified: Secondary | ICD-10-CM | POA: Diagnosis not present

## 2020-03-03 ENCOUNTER — Other Ambulatory Visit: Payer: Self-pay | Admitting: Family Medicine

## 2020-03-03 DIAGNOSIS — I10 Essential (primary) hypertension: Secondary | ICD-10-CM

## 2020-04-17 ENCOUNTER — Other Ambulatory Visit: Payer: Self-pay

## 2020-04-17 ENCOUNTER — Encounter: Payer: Self-pay | Admitting: Physician Assistant

## 2020-04-17 ENCOUNTER — Ambulatory Visit (INDEPENDENT_AMBULATORY_CARE_PROVIDER_SITE_OTHER): Payer: Medicare HMO | Admitting: Physician Assistant

## 2020-04-17 VITALS — BP 145/75 | HR 83 | Temp 97.0°F | Resp 16 | Wt 105.0 lb

## 2020-04-17 DIAGNOSIS — L237 Allergic contact dermatitis due to plants, except food: Secondary | ICD-10-CM | POA: Diagnosis not present

## 2020-04-17 DIAGNOSIS — L0390 Cellulitis, unspecified: Secondary | ICD-10-CM

## 2020-04-17 DIAGNOSIS — W57XXXA Bitten or stung by nonvenomous insect and other nonvenomous arthropods, initial encounter: Secondary | ICD-10-CM | POA: Diagnosis not present

## 2020-04-17 MED ORDER — PREDNISONE 10 MG (21) PO TBPK: ORAL_TABLET | ORAL | 0 refills | Status: DC

## 2020-04-17 MED ORDER — CEPHALEXIN 500 MG PO CAPS: 500.0000 mg | ORAL_CAPSULE | Freq: Two times a day (BID) | ORAL | 0 refills | Status: DC

## 2020-04-17 NOTE — Progress Notes (Signed)
Established patient visit   Patient: Morgan Ponce   DOB: [birth year]   79 y.o. Female  MRN: [id number] Visit Date: 04/17/2020  Today's healthcare provider: Mar Daring, PA-C   No chief complaint on file.  Subjective    Rash This is a new problem. The current episode started 1 to 4 weeks ago (Last Thursday). The rash is diffuse. The rash is characterized by itchiness, blistering and redness. She was exposed to plant contact and an insect bite/sting. Pertinent negatives include no facial edema, fatigue, fever, joint pain, rhinorrhea, shortness of breath or sore throat. Treatments tried: Caladryl. The treatment provided no relief.     Patient Active Problem List   Diagnosis Date Noted  . Hypertension 03/21/2016  . History of anemia 08/21/2015  . OP (osteoporosis) 08/21/2015  . Herpes zona 08/21/2015  . Breast calcifications on mammogram 08/16/2014  . Flutter-fibrillation ([place]) 02/09/2010  . Avitaminosis D 12/12/2008  . Menopausal and postmenopausal disorder 09/13/2008  . BP (high blood pressure) 11/02/2007  . Hypercholesteremia 11/02/2007   Past Medical History:  Diagnosis Date  . Arthritis   . Dysrhythmia   . HOH (hard of hearing)    AIDS  . Hyperlipidemia   . Hypertension    RESTARTED BP MED SINCE FIRST CATARACT  . Pain    RIGHT KNEE  . Skin cancer of forehead        Medications: Outpatient Medications Prior to Visit  Medication Sig  . aspirin 81 MG tablet Take 81 mg at bedtime by mouth.   . hydrochlorothiazide (HYDRODIURIL) 25 MG tablet TAKE 1 TABLET BY MOUTH ONCE A DAY  . losartan (COZAAR) 50 MG tablet TAKE 1 TABLET BY MOUTH ONCE DAILY  . lovastatin (MEVACOR) 10 MG tablet TAKE 1 TABLET BY MOUTH DAILY AT BEDTIME  . Polyvinyl Alcohol-Povidone (REFRESH OP) Place 1 drop 5 (five) times daily as needed into both eyes (DRY EYES).  . Vitamin D, Cholecalciferol, 1000 UNITS TABS Take 1 tablet by mouth daily.   No facility-administered medications prior to  visit.    Review of Systems  Constitutional: Negative for fatigue and fever.  HENT: Negative for rhinorrhea and sore throat.   Respiratory: Negative for shortness of breath.   Musculoskeletal: Negative for joint pain.  Skin: Positive for rash.    Last CBC Lab Results  Component Value Date   WBC 5.1 11/16/2019   HGB 11.8 11/16/2019   HCT 36.0 11/16/2019   MCV 93 11/16/2019   MCH 30.5 11/16/2019   RDW 13.3 11/16/2019   PLT 212 84/69/6295   Last metabolic panel Lab Results  Component Value Date   GLUCOSE 99 11/16/2019   NA 139 11/16/2019   K 4.1 11/16/2019   CL 98 11/16/2019   CO2 26 11/16/2019   BUN 24 11/16/2019   CREATININE 1.14 (H) 11/16/2019   GFRNONAA 46 (L) 11/16/2019   GFRAA 52 (L) 11/16/2019   CALCIUM 10.1 11/16/2019   PHOS 3.6 04/21/2017   PROT 7.4 11/16/2019   ALBUMIN 4.3 11/16/2019   LABGLOB 3.1 11/16/2019   AGRATIO 1.4 11/16/2019   BILITOT 0.4 11/16/2019   ALKPHOS 61 11/16/2019   AST 27 11/16/2019   ALT 9 11/16/2019   ANIONGAP 5 (L) 02/12/2012      Objective    There were no vitals taken for this visit. BP Readings from Last 3 Encounters:  04/17/20 (!) 145/75  11/22/19 (!) 149/74  11/16/19 (!) 152/70   Wt Readings from Last 3 Encounters:  04/17/20  105 lb (47.6 kg)  11/22/19 103 lb 3.2 oz (46.8 kg)  11/16/19 104 lb 9.6 oz (47.4 kg)      Physical Exam Vitals reviewed.  Constitutional:      General: She is not in acute distress.    Appearance: Normal appearance. She is well-developed and normal weight. She is not ill-appearing or diaphoretic.  Cardiovascular:     Heart sounds: No gallop.   Pulmonary:     Effort: No respiratory distress.  Musculoskeletal:     Cervical back: Normal range of motion and neck supple.  Skin:    Comments: Diffuse vesicular rash on erythematous base Three bug bites along waistband line. One on the left hip, one on the right hip, and one midline directly over spine. Surrounding erythema. Bite over left hip is  purulent appearing. No fluctuance or induration.   Neurological:     Mental Status: She is alert.       No results found for any visits on 04/17/20.  Assessment & Plan     1. Poison oak dermatitis Noted on legs and arms. Will give prednisone taper as below.  - predniSONE (STERAPRED UNI-PAK 21 TAB) 10 MG (21) TBPK tablet; 6 day taper; take as directed on package instructions  Dispense: 21 tablet; Refill: 0  2. Wound cellulitis Around bug bites. Will give keflex as below for infected appearing bug bites. Call if worsening.  - cephALEXin (KEFLEX) 500 MG capsule; Take 1 capsule (500 mg total) by mouth 2 (two) times daily.  Dispense: 10 capsule; Refill: 0  3. Bug bite, initial encounter See above medical treatment plan. - cephALEXin (KEFLEX) 500 MG capsule; Take 1 capsule (500 mg total) by mouth 2 (two) times daily.  Dispense: 10 capsule; Refill: 0   No follow-ups on file.      Reynolds Bowl, PA-C, have reviewed all documentation for this visit. The documentation on 04/19/20 for the exam, diagnosis, procedures, and orders are all accurate and complete.   Rubye Beach  [facility] [phone] (phone) [phone] (fax)  [place]

## 2020-04-17 NOTE — Patient Instructions (Addendum)
Poison Oak Dermatitis  Poison oak dermatitis is redness and soreness (inflammation) of the skin caused by chemicals in the leaves of the poison oak plant. You may have very bad itching, swelling, a rash, and blisters. What are the causes? You may get this condition by:  Touching a poison oak plant.  Touching something that has the chemical from the leaves on it. This may include animals or objects that have come in contact with the plant. What increases the risk? You are more likely to get this condition if you:  Go outdoors often in wooded or marshy areas.  Go outdoors without wearing protective clothing, such as closed shoes, long pants, and a long-sleeved shirt. What are the signs or symptoms? Symptoms of this condition include:  Redness of the skin.  Very bad itching.  A rash that often includes bumps and blisters. ? The rash usually appears 48 hours after exposure if you have been exposed before. ? If this is the first time you have been exposed, the rash may not appear until a week after exposure.  Swelling. This may occur if the reaction is very bad. Symptoms often clear up in 1-2 weeks. The first time you develop this condition, symptoms may last 3-4 weeks. How is this treated? This condition may be treated with:  Hydrocortisone creams or calamine lotions to help with itching.  Oatmeal baths to soothe the skin.  Medicines to help reduce itching (antihistamines). If you have a very bad reaction, you may also be given steroid medicines. Follow these instructions at home: Medicines  Take or apply over-the-counter and prescription medicines only as told by your doctor.  Use hydrocortisone creams or calamine lotion as needed to help with itching. General instructions  Do not scratch or rub your skin.  Put a cold, wet cloth (cold compress) on the affected areas or take baths in cool water. This will help with itching.  Avoid hot baths and showers.  Take oatmeal  baths as needed. Use colloidal oatmeal. You can get this at a pharmacy or grocery store. Follow the instructions on the package.  While you have the rash, wash your clothes right after you wear them.  Keep all follow-up visits as told by your doctor. This is important. How is this prevented?   Know what poison oak looks like so you can avoid it. ? This plant has three leaves with flowering branches on a single stem. ? The leaves are fuzzy. ? The edges of the leaves look like teeth.  If you have touched poison oak, wash your skin with soap and water right away. Be sure to wash under your fingernails.  When hiking or camping, wear long pants, a long-sleeved shirt, tall socks, and hiking boots. You can also use a lotion on your skin that helps to prevent contact with the chemical on the plant.  If you think that your clothes or outdoor gear came in contact with poison oak, rinse them off with a garden hose before you bring them inside your house.  When doing yard work or gardening, wear gloves, long sleeves, long pants, and boots. Wash your garden tools and gloves if they come in contact with poison oak.  If you think that your pet has come into contact with poison oak, wash him or her with pet shampoo and water. Make sure you wear gloves while washing your pet.  Do not burn poison oak plants. This can release the chemical from the plant into the air and   may cause a reaction. Contact a doctor if:  You have open sores in the rash area.  You have more redness, swelling, or pain in the affected area.  You have redness that spreads beyond the rash area.  You have fluid, blood, or pus coming from the affected area.  You have a fever.  You have a rash over a large area of your body.  You have a rash on your eyes, mouth, or genitals.  Your rash does not improve after a few weeks. Get help right away if:  Your face swells or your eyes swell shut.  You have trouble breathing.  You  have trouble swallowing. These symptoms may be an emergency. Do not wait to see if the symptoms will go away. Get medical help right away. Call your local emergency services (911 in the [place] Do not drive yourself to the hospital. Summary  Poison oak dermatitis is redness and soreness of the skin caused by chemicals in the leaves of the poison oak plant.  Symptoms of this condition include redness, very bad itching, a rash, and swelling.  Do not scratch or rub your skin.  Take or apply over-the-counter and prescription medicines only as told by your doctor. This information is not intended to replace advice given to you by your health care provider. Make sure you discuss any questions you have with your health care provider. Document Revised: 01/08/2019 Document Reviewed: 10/16/2018 Elsevier Patient Education  [place], Adult An insect bite can make your skin red, itchy, and swollen. Some insects can spread disease to people with a bite. However, most insect bites do not lead to disease, and most are not serious. What are the causes? Insects may bite for many reasons, including:  Hunger.  To defend themselves. Insects that bite include:  Spiders.  Mosquitoes.  Ticks.  Fleas.  Ants.  Flies.  Kissing bugs.  Chiggers. What are the signs or symptoms? Symptoms of this condition include:  Itching or pain in the bite area.  Redness and swelling in the bite area.  An open wound (skin ulcer). Symptoms often last for 2-4 days. In rare cases, a person may have a very bad allergic reaction (anaphylactic reaction) to a bite. Symptoms of an anaphylactic reaction may include:  Feeling warm in the face (flushed). Your face may turn red.  Itchy, red, swollen areas of skin (hives).  Swelling of the: ? Eyes. ? Lips. ? Face. ? Mouth. ? Tongue. ? Throat.  Trouble with any of these: ? Breathing. ? Talking. ? Swallowing.  Loud breathing  (wheezing).  Feeling dizzy or light-headed.  Passing out (fainting).  Pain or cramps in your belly.  Throwing up (vomiting).  Watery poop (diarrhea). How is this treated? Treatment is usually not needed. Symptoms often go away on their own. When treatment is needed, it may involve:  Putting a cream or lotion on the bite area. This helps with itching.  Taking an antibiotic medicine. This treatment is needed if the bite area gets infected.  Getting a tetanus shot, if you are not up to date on this vaccine.  Putting ice on the affected area.  Using medicines called antihistamines. This treatment may be needed if you have itching or an allergic reaction to the insect bite.  Giving yourself a shot of medicine (epinephrine) using an auto-injector "pen" if you have an anaphylactic reaction to a bite. Your doctor will teach you how to use this pen. Follow these  instructions at home: Bite area care   Do not scratch the bite area.  Keep the bite area clean and dry.  Wash the bite area every day with soap and water as told by your doctor.  Check the bite area every day for signs of infection. Check for: ? Redness, swelling, or pain. ? Fluid or blood. ? Warmth. ? Pus or a bad smell. Managing pain, itching, and swelling   You may put any of these on the bite area as told by your doctor: ? A paste made of baking soda and water. ? Cortisone cream. ? Calamine lotion.  If told, put ice on the bite area. ? Put ice in a plastic bag. ? Place a towel between your skin and the bag. ? Leave the ice on for 20 minutes, 2-3 times a day. General instructions  Apply or take over-the-counter and prescription medicines only as told by your doctor.  If you were prescribed an antibiotic medicine, take or apply it as told by your doctor. Do not stop using the antibiotic even if your condition improves.  Keep all follow-up visits as told by your doctor. This is important. How is this  prevented? To help you have a lower risk of insect bites:  When you are outside, wear clothing that covers your arms and legs.  Use insect repellent. The best insect repellents contain one of these: ? DEET. ? Picaridin. ? Oil of lemon eucalyptus (OLE). ? IR3535.  Consider spraying your clothing with a pesticide called permethrin. Permethrin helps prevent insect bites. It works for several weeks and for up to 5-6 clothing washes. Do not apply permethrin directly to the skin.  If your home windows do not have screens, think about putting some in.  If you will be sleeping in an area where there are mosquitoes, consider covering your sleeping area with a mosquito net. Contact a doctor if:  You have redness, swelling, or pain in the bite area.  You have fluid or blood coming from the bite area.  The bite area feels warm to the touch.  You have pus or a bad smell coming from the bite area.  You have a fever. Get help right away if:  You have joint pain.  You have a rash.  You feel more tired or sleepy than you normally do.  You have neck pain.  You have a headache.  You feel weaker than you normally do.  You have signs of an anaphylactic reaction. Signs may include: ? Feeling warm in the face. ? Itchy, red, swollen areas of skin. ? Swelling of your:  Eyes.  Lips.  Face.  Mouth.  Tongue.  Throat. ? Trouble with any of these:  Breathing.  Talking.  Swallowing. ? Loud breathing. ? Feeling dizzy or light-headed. ? Passing out. ? Pain or cramps in your belly. ? Throwing up. ? Watery poop. These symptoms may be an emergency. Do not wait to see if the symptoms will go away. Do this right away:  Use your auto-injector pen as you have been told.  Get medical help. Call your local emergency services (911 in the [place] Do not drive yourself to the hospital. Summary  An insect bite can make your skin red, itchy, and swollen.  Treatment is usually not  needed. Symptoms often go away on their own.  Do not scratch the bite area. Keep it clean and dry.  Ice can help with pain and itching from the bite. This information is  not intended to replace advice given to you by your health care provider. Make sure you discuss any questions you have with your health care provider. Document Revised: 03/27/2018 Document Reviewed: 03/27/2018 Elsevier Patient Education  [place].

## 2020-05-15 NOTE — Progress Notes (Signed)
I,[name],acting as a scribe for Morgan Durie, MD.,have documented all relevant documentation on the behalf of Morgan Durie, MD,as directed by  Morgan Durie, MD while in the presence of Morgan Durie, MD.   Established patient visit   Patient: Morgan Ponce   DOB: 1940/12/27   79 y.o. Female  MRN: [id number] Visit Date: 05/16/2020  Today's healthcare provider: [name], MD   Chief Complaint  Patient presents with  . Follow-up  . Hypertension   Subjective    HPI  Patient doing well and coping with the death of her husband. Hypertension, follow-up  BP Readings from Last 3 Encounters:  05/16/20 138/72  04/17/20 (!) 145/75  11/22/19 (!) 149/74   Wt Readings from Last 3 Encounters:  05/16/20 107 lb (48.5 kg)  04/17/20 105 lb (47.6 kg)  11/22/19 103 lb 3.2 oz (46.8 kg)     She was last seen for hypertension 6 months ago.  BP at that visit was 152/70. Management since that visit includes; on losartan and HCTZ. Labs checked showing-stable. She reports good compliance with treatment. She is not having side effects. none She some walking exercising. She is adherent to low salt diet.   Outside blood pressures are 114/74.  She does not smoke.  Use of agents associated with hypertension: none.   --------------------------------------------------------------------       Medications: Outpatient Medications Prior to Visit  Medication Sig  . aspirin 81 MG tablet Take 81 mg at bedtime by mouth.   . hydrochlorothiazide (HYDRODIURIL) 25 MG tablet TAKE 1 TABLET BY MOUTH ONCE A DAY  . losartan (COZAAR) 50 MG tablet TAKE 1 TABLET BY MOUTH ONCE DAILY  . lovastatin (MEVACOR) 10 MG tablet TAKE 1 TABLET BY MOUTH DAILY AT BEDTIME  . Polyvinyl Alcohol-Povidone (REFRESH OP) Place 1 drop 5 (five) times daily as needed into both eyes (DRY EYES).  . Vitamin D, Cholecalciferol, 1000 UNITS TABS Take 1 tablet by mouth daily.  . cephALEXin (KEFLEX) 500  MG capsule Take 1 capsule (500 mg total) by mouth 2 (two) times daily. (Patient not taking: Reported on 05/16/2020)  . predniSONE (STERAPRED UNI-PAK 21 TAB) 10 MG (21) TBPK tablet 6 day taper; take as directed on package instructions (Patient not taking: Reported on 05/16/2020)   No facility-administered medications prior to visit.    Review of Systems  Constitutional: Negative for appetite change, chills, fatigue and fever.  Respiratory: Negative for chest tightness and shortness of breath.   Cardiovascular: Negative for chest pain and palpitations.  Gastrointestinal: Negative for abdominal pain, nausea and vomiting.  Neurological: Negative for dizziness and weakness.       Objective    BP 138/72 (BP Location: Left Arm, Patient Position: Sitting, Cuff Size: Normal)   Pulse 75   Temp 98.1 F (36.7 C) (Oral)   Resp 16   Ht 5\' 3"  (1.6 m)   Wt 107 lb (48.5 kg)   SpO2 97%   BMI 18.95 kg/m  BP Readings from Last 3 Encounters:  05/16/20 138/72  04/17/20 (!) 145/75  11/22/19 (!) 149/74   Wt Readings from Last 3 Encounters:  05/16/20 107 lb (48.5 kg)  04/17/20 105 lb (47.6 kg)  11/22/19 103 lb 3.2 oz (46.8 kg)      Depression screen [facility] 2/9 05/16/2020 11/10/2018 11/10/2018  Decreased Interest 0 0 0  Down, Depressed, Hopeless 0 0 0  PHQ - 2 Score 0 0 0  Altered sleeping 0 0 -  Tired, decreased energy 0 0 -  Change in appetite 0 0 -  Feeling bad or failure about yourself  0 0 -  Trouble concentrating 0 0 -  Moving slowly or fidgety/restless 0 0 -  Suicidal thoughts 0 0 -  PHQ-9 Score 0 0 -  Difficult doing work/chores Not difficult at all Not difficult at all -    Physical Exam Vitals reviewed.  Constitutional:      Appearance: She is well-developed.  HENT:     Head: Normocephalic and atraumatic.     Right Ear: External ear normal.     Left Ear: External ear normal.     Nose: Nose normal.  Eyes:     General: No scleral icterus.    Conjunctiva/sclera: Conjunctivae  normal.  Neck:     Thyroid: No thyromegaly.  Cardiovascular:     Rate and Rhythm: Normal rate and regular rhythm.     Heart sounds: Normal heart sounds.  Pulmonary:     Effort: Pulmonary effort is normal.     Breath sounds: Normal breath sounds.  Chest:     Breasts:        Right: Normal.        Left: Normal.  Abdominal:     Palpations: Abdomen is soft.  Musculoskeletal:        General: No tenderness.     Right lower leg: No edema.     Left lower leg: No edema.  Lymphadenopathy:     Cervical: No cervical adenopathy.  Skin:    General: Skin is warm and dry.  Neurological:     General: No focal deficit present.     Mental Status: She is alert and oriented to person, place, and time.  Psychiatric:        Mood and Affect: Mood normal.        Behavior: Behavior normal.        Thought Content: Thought content normal.        Judgment: Judgment normal.     BP 138/72 (BP Location: Left Arm, Patient Position: Sitting, Cuff Size: Normal)   Pulse 75   Temp 98.1 F (36.7 C) (Oral)   Resp 16   Ht 5\' 3"  (1.6 m)   Wt 107 lb (48.5 kg)   SpO2 97%   BMI 18.95 kg/m   General Appearance:    Alert, cooperative, no distress, appears stated age  Head:    Normocephalic, without obvious abnormality, atraumatic  Eyes:    PERRL, conjunctiva/corneas clear, EOM's intact, fundi    benign, both eyes  Ears:    Normal TM's and external ear canals, both ears  Nose:   Nares normal, septum midline, mucosa normal, no drainage    or sinus tenderness  Throat:   Lips, mucosa, and tongue normal; teeth and gums normal  Neck:   Supple, symmetrical, trachea midline, no adenopathy;    thyroid:  no enlargement/tenderness/nodules; no carotid   bruit or JVD  Back:     Symmetric, no curvature, ROM normal, no CVA tenderness  Lungs:     Clear to auscultation bilaterally, respirations unlabored  Chest Wall:    No tenderness or deformity   Heart:    Regular rate and rhythm, S1 and S2 normal, no murmur, rub   or  gallop  Breast Exam:    No tenderness, masses, or nipple abnormality  Abdomen:     Soft, non-tender, bowel sounds active all four quadrants,    no masses, no organomegaly  Genitalia:    Normal female without lesion, discharge or tenderness  Rectal:    Normal tone, normal prostate, no masses or tenderness;   guaiac negative stool  Extremities:   Extremities normal, atraumatic, no cyanosis or edema  Pulses:   2+ and symmetric all extremities  Skin:   Skin color, texture, turgor normal, no rashes or lesions  Lymph nodes:   Cervical, supraclavicular, and axillary nodes normal  Neurologic:   CNII-XII intact, normal strength, sensation and reflexes    throughout     No results found for any visits on 05/16/20.  Assessment & Plan     1. Essential hypertension Under good control.  2. Other osteoporosis without current pathological fracture   3. Hypercholesteremia On statin.   Return in about 6 months (around 11/16/2020) for AWV and CPE.         Loriana Samad Cranford Mon, MD  [facility] [phone] (phone) [phone] (fax)  [place]

## 2020-05-16 ENCOUNTER — Encounter: Payer: Self-pay | Admitting: Family Medicine

## 2020-05-16 ENCOUNTER — Ambulatory Visit (INDEPENDENT_AMBULATORY_CARE_PROVIDER_SITE_OTHER): Payer: Medicare HMO | Admitting: Family Medicine

## 2020-05-16 ENCOUNTER — Other Ambulatory Visit: Payer: Self-pay

## 2020-05-16 VITALS — BP 138/72 | HR 75 | Temp 98.1°F | Resp 16 | Ht 63.0 in | Wt 107.0 lb

## 2020-05-16 DIAGNOSIS — M818 Other osteoporosis without current pathological fracture: Secondary | ICD-10-CM | POA: Diagnosis not present

## 2020-05-16 DIAGNOSIS — I10 Essential (primary) hypertension: Secondary | ICD-10-CM | POA: Diagnosis not present

## 2020-05-16 DIAGNOSIS — E7800 Pure hypercholesterolemia, unspecified: Secondary | ICD-10-CM | POA: Diagnosis not present

## 2020-06-07 ENCOUNTER — Other Ambulatory Visit: Payer: Self-pay | Admitting: Family Medicine

## 2020-06-07 DIAGNOSIS — I10 Essential (primary) hypertension: Secondary | ICD-10-CM

## 2020-06-07 NOTE — Telephone Encounter (Signed)
Requested medication (s) are due for refill today: Yes  Requested medication (s) are on the active medication list: Yes  Last refill: 03/10/19  Future visit scheduled: Yes  Notes to clinic:  Unable to refill per protocol, expired Rx     Requested Prescriptions  Pending Prescriptions Disp Refills   hydrochlorothiazide (HYDRODIURIL) 25 MG tablet [Pharmacy Med Name: HYDROCHLOROTHIAZIDE 25 MG TAB] 90 tablet 3    Sig: TAKE 1 TABLET BY MOUTH ONCE DAILY      Cardiovascular: Diuretics - Thiazide Failed - 06/07/2020 11:05 AM      Failed - Cr in normal range and within 360 days    Creatinine  Date Value Ref Range Status  02/12/2012 0.87 0.60 - 1.30 mg/dL Final   Creatinine, Ser  Date Value Ref Range Status  11/16/2019 1.14 (H) 0.57 - 1.00 mg/dL Final          Passed - Ca in normal range and within 360 days    Calcium  Date Value Ref Range Status  11/16/2019 10.1 8.7 - 10.3 mg/dL Final   Calcium, Total  Date Value Ref Range Status  02/12/2012 7.2 (L) 8.5 - 10.1 mg/dL Final          Passed - K in normal range and within 360 days    Potassium  Date Value Ref Range Status  11/16/2019 4.1 3.5 - 5.2 mmol/L Final  02/12/2012 4.8 3.5 - 5.1 mmol/L Final          Passed - Na in normal range and within 360 days    Sodium  Date Value Ref Range Status  11/16/2019 139 134 - 144 mmol/L Final  02/12/2012 143 136 - 145 mmol/L Final          Passed - Last BP in normal range    BP Readings from Last 1 Encounters:  05/16/20 138/72          Passed - Valid encounter within last 6 months    Recent Outpatient Visits           3 weeks ago Essential hypertension   [facility] Jerrol Banana., MD   1 month ago [facility] dermatitis   [facility] [place], [place]   6 months ago Contusion of left orbital tissues, initial encounter   [facility] Jerrol Banana., MD   6 months ago Encounter for annual physical exam    [facility] Jerrol Banana., MD   10 months ago Dermatitis   [facility] Jerrol Banana., MD       Future Appointments             In 6 months Jerrol Banana., MD [facility], [place]

## 2020-06-14 DIAGNOSIS — H43813 Vitreous degeneration, bilateral: Secondary | ICD-10-CM | POA: Diagnosis not present

## 2020-06-15 ENCOUNTER — Telehealth: Payer: Self-pay

## 2020-06-15 ENCOUNTER — Ambulatory Visit (INDEPENDENT_AMBULATORY_CARE_PROVIDER_SITE_OTHER): Payer: Medicare HMO

## 2020-06-15 ENCOUNTER — Other Ambulatory Visit: Payer: Self-pay

## 2020-06-15 DIAGNOSIS — Z23 Encounter for immunization: Secondary | ICD-10-CM

## 2020-06-15 NOTE — Telephone Encounter (Signed)
Copied from [place] [id number]. Topic: Appointment Scheduling - Scheduling Inquiry for Clinic >> Jun 15, 2020  7:49 AM Scherrie Gerlach wrote: Reason for CRM: pt wants to know if the high dose flu shot is in yet?  If so, please call her and schedule. Thanks.

## 2020-06-15 NOTE — Telephone Encounter (Signed)
Patient advised that flu shots are in and also scheduled for flu vaccine clinic.

## 2020-06-19 DIAGNOSIS — D2271 Melanocytic nevi of right lower limb, including hip: Secondary | ICD-10-CM | POA: Diagnosis not present

## 2020-06-19 DIAGNOSIS — L821 Other seborrheic keratosis: Secondary | ICD-10-CM | POA: Diagnosis not present

## 2020-06-19 DIAGNOSIS — D2261 Melanocytic nevi of right upper limb, including shoulder: Secondary | ICD-10-CM | POA: Diagnosis not present

## 2020-06-19 DIAGNOSIS — D2262 Melanocytic nevi of left upper limb, including shoulder: Secondary | ICD-10-CM | POA: Diagnosis not present

## 2020-06-19 DIAGNOSIS — Z85828 Personal history of other malignant neoplasm of skin: Secondary | ICD-10-CM | POA: Diagnosis not present

## 2020-06-19 DIAGNOSIS — D225 Melanocytic nevi of trunk: Secondary | ICD-10-CM | POA: Diagnosis not present

## 2020-08-28 DIAGNOSIS — R69 Illness, unspecified: Secondary | ICD-10-CM | POA: Diagnosis not present

## 2020-09-05 ENCOUNTER — Other Ambulatory Visit: Payer: Self-pay | Admitting: Family Medicine

## 2020-09-05 DIAGNOSIS — I10 Essential (primary) hypertension: Secondary | ICD-10-CM

## 2020-09-11 ENCOUNTER — Other Ambulatory Visit: Payer: Self-pay | Admitting: Family Medicine

## 2020-09-11 DIAGNOSIS — Z1231 Encounter for screening mammogram for malignant neoplasm of breast: Secondary | ICD-10-CM

## 2020-11-22 ENCOUNTER — Other Ambulatory Visit: Payer: Self-pay

## 2020-11-22 DIAGNOSIS — E785 Hyperlipidemia, unspecified: Secondary | ICD-10-CM | POA: Diagnosis not present

## 2020-11-22 DIAGNOSIS — Z85828 Personal history of other malignant neoplasm of skin: Secondary | ICD-10-CM | POA: Diagnosis not present

## 2020-11-22 DIAGNOSIS — I6522 Occlusion and stenosis of left carotid artery: Secondary | ICD-10-CM

## 2020-11-22 DIAGNOSIS — Z803 Family history of malignant neoplasm of breast: Secondary | ICD-10-CM | POA: Diagnosis not present

## 2020-11-22 DIAGNOSIS — Z7982 Long term (current) use of aspirin: Secondary | ICD-10-CM | POA: Diagnosis not present

## 2020-11-22 DIAGNOSIS — Z008 Encounter for other general examination: Secondary | ICD-10-CM | POA: Diagnosis not present

## 2020-11-22 DIAGNOSIS — I10 Essential (primary) hypertension: Secondary | ICD-10-CM | POA: Diagnosis not present

## 2020-11-22 DIAGNOSIS — M1990 Unspecified osteoarthritis, unspecified site: Secondary | ICD-10-CM | POA: Diagnosis not present

## 2020-11-22 DIAGNOSIS — Z8249 Family history of ischemic heart disease and other diseases of the circulatory system: Secondary | ICD-10-CM | POA: Diagnosis not present

## 2020-11-22 DIAGNOSIS — H04129 Dry eye syndrome of unspecified lacrimal gland: Secondary | ICD-10-CM | POA: Diagnosis not present

## 2020-11-29 ENCOUNTER — Other Ambulatory Visit: Payer: Self-pay | Admitting: Family Medicine

## 2020-12-01 ENCOUNTER — Ambulatory Visit
Admission: RE | Admit: 2020-12-01 | Discharge: 2020-12-01 | Disposition: A | Discharge status: Home / Self Care | Payer: Medicare HMO | Source: Ambulatory Visit | Attending: Family Medicine | Admitting: Family Medicine

## 2020-12-01 ENCOUNTER — Other Ambulatory Visit: Payer: Self-pay

## 2020-12-01 DIAGNOSIS — I6522 Occlusion and stenosis of left carotid artery: Secondary | ICD-10-CM | POA: Insufficient documentation

## 2020-12-07 ENCOUNTER — Telehealth: Payer: Self-pay

## 2020-12-07 NOTE — Telephone Encounter (Signed)
Please advise results? 

## 2020-12-07 NOTE — Telephone Encounter (Signed)
Copied from [place] [id number]. Topic: General - Other >> Dec 07, 2020 12:01 PM Alanda Slim E wrote: Reason for CRM: Pt called to speak to [facility] about her ultrasound report/ she hasnt heard anything about the results / please advise

## 2020-12-08 NOTE — Telephone Encounter (Signed)
Tried calling pt back. No answer and no vm. Will try again later.

## 2020-12-08 NOTE — Telephone Encounter (Signed)
Within normal limits

## 2020-12-12 NOTE — Telephone Encounter (Signed)
Patient was advised.  

## 2020-12-13 NOTE — Progress Notes (Signed)
Subjective:   Morgan Ponce is a 80 y.o. female who presents for Medicare Annual (Subsequent) preventive examination.  Review of Systems    N/A  Cardiac Risk Factors include: advanced age (>25men, >99 women);dyslipidemia;hypertension     Objective:    Today's Vitals   12/18/20 0913 12/18/20 0930  BP: (!) 150/70 (!) 144/70  Pulse: 82   Temp: 98 F (36.7 C)   TempSrc: Oral   SpO2: 98%   Weight: 105 lb 12.8 oz (48 kg)   Height: 5\' 3"  (1.6 m)   PainSc: 0-No pain    Body mass index is 18.74 kg/m.  Advanced Directives 12/18/2020 11/16/2019 11/10/2018 09/15/2017 09/02/2017 05/06/2017 04/21/2017  Does Patient Have a Medical Advance Directive? Yes Yes Yes Yes Yes Yes Yes  Type of Paramedic of [place];Living will [place];Living will [place];Living will [place];Living will [place];Living will [place];Living will Living will;Healthcare Power of Attorney  Does patient want to make changes to medical advance directive? - - - - - No - Patient declined -  Copy of [place] in Chart? Yes - validated most recent copy scanned in chart (See row information) No - copy requested No - copy requested No - copy requested No - copy requested No - copy requested -    Current Medications (verified) Outpatient Encounter Medications as of 12/18/2020  Medication Sig  . aspirin 81 MG tablet Take 81 mg at bedtime by mouth.   . hydrochlorothiazide (HYDRODIURIL) 25 MG tablet TAKE 1 TABLET BY MOUTH ONCE DAILY  . losartan (COZAAR) 50 MG tablet TAKE 1 TABLET BY MOUTH ONCE DAILY  . lovastatin (MEVACOR) 10 MG tablet TAKE 1 TABLET BY MOUTH DAILY AT BEDTIME  . Polyvinyl Alcohol-Povidone (REFRESH OP) Place 1 drop 5 (five) times daily as needed into both eyes (DRY EYES).  . Vitamin D, Cholecalciferol, 1000 UNITS TABS Take 1 tablet by mouth daily.  . cephALEXin (KEFLEX) 500  MG capsule Take 1 capsule (500 mg total) by mouth 2 (two) times daily. (Patient not taking: No sig reported)  . predniSONE (STERAPRED UNI-PAK 21 TAB) 10 MG (21) TBPK tablet 6 day taper; take as directed on package instructions (Patient not taking: No sig reported)   No facility-administered encounter medications on file as of 12/18/2020.    Allergies (verified) Cardizem [diltiazem] and Diltiazem hcl   History: Past Medical History:  Diagnosis Date  . Arthritis   . Dysrhythmia   . HOH (hard of hearing)    AIDS  . Hyperlipidemia   . Hypertension    RESTARTED BP MED SINCE FIRST CATARACT  . Pain    RIGHT KNEE  . Skin cancer of forehead    Past Surgical History:  Procedure Laterality Date  . BREAST BIOPSY Right 08/22/2014   benign. Done by Dr Collene Schlichter  . BREAST EXCISIONAL BIOPSY Right 1972   benign  . CATARACT EXTRACTION W/PHACO Right 05/06/2017   Procedure: CATARACT EXTRACTION PHACO AND INTRAOCULAR LENS PLACEMENT (IOC);  Surgeon: Birder Robson, MD;  Location: [facility] ORS;  Service: Ophthalmology;  Laterality: Right;  [place] 00:40 AP% 17.6 CDE 7.16 Fluid pack lot # [id number]  . CATARACT EXTRACTION W/PHACO Left 09/02/2017   Procedure: CATARACT EXTRACTION PHACO AND INTRAOCULAR LENS PLACEMENT ([place]);  Surgeon: Birder Robson, MD;  Location: [facility] ORS;  Service: Ophthalmology;  Laterality: Left;  [place] 00:39 AP% 15.0 CDE 5.91 Fluid pack lot # [id number]  . CHOLECYSTECTOMY  2013  . COLONOSCOPY N/A 02/06/2015   Procedure: COLONOSCOPY;  Surgeon: Hulen Luster, MD;  Location: [facility] ENDOSCOPY;  Service: Gastroenterology;  Laterality: N/A;  . HYSTEROSCOPY  1999   Family History  Problem Relation Age of Onset  . Hypertension Mother   . Stroke Mother   . Heart disease Father   . Hypertension Father   . Hypertension Sister   . Breast cancer Sister 22  . Heart attack Brother    Social History   Socioeconomic History  . Marital status: Widowed    Spouse name: Not on file  . Number of children: 0   . Years of education: Not on file  . Highest education level: High school graduate  Occupational History  . Occupation: retired  Tobacco Use  . Smoking status: Never Smoker  . Smokeless tobacco: Never Used  Vaping Use  . Vaping Use: Never used  Substance and Sexual Activity  . Alcohol use: No    Alcohol/week: 0.0 standard drinks  . Drug use: No  . Sexual activity: Never  Other Topics Concern  . Not on file  Social History Narrative  . Not on file   Social Determinants of Health   Financial Resource Strain: Low Risk   . Difficulty of Paying Living Expenses: Not hard at all  Food Insecurity: No Food Insecurity  . Worried About Charity fundraiser in the Last Year: Never true  . Ran Out of Food in the Last Year: Never true  Transportation Needs: No Transportation Needs  . Lack of Transportation (Medical): No  . Lack of Transportation (Non-Medical): No  Physical Activity: Insufficiently Active  . Days of Exercise per Week: 2 days  . Minutes of Exercise per Session: 10 min  Stress: No Stress Concern Present  . Feeling of Stress : Not at all  Social Connections: Moderately Isolated  . Frequency of Communication with Friends and Family: More than three times a week  . Frequency of Social Gatherings with Friends and Family: More than three times a week  . Attends Religious Services: More than 4 times per year  . Active Member of Clubs or Organizations: No  . Attends Archivist Meetings: Never  . Marital Status: Widowed    Tobacco Counseling Counseling given: Not Answered   Clinical Intake:  Pre-visit preparation completed: Yes  Pain : No/denies pain Pain Score: 0-No pain     Nutritional Status: BMI <19  Underweight Nutritional Risks: None Diabetes: No  How often do you need to have someone help you when you read instructions, pamphlets, or other written materials from your doctor or pharmacy?: 1 - Never  Diabetic? No  Interpreter Needed?:  No  Information entered by :: [facility], LPN   Activities of Daily Living In your present state of health, do you have any difficulty performing the following activities: 12/18/2020  Hearing? N  Comment Wears bilateral hearing aids.  Vision? N  Comment Wears eye glasses.  Difficulty concentrating or making decisions? N  Walking or climbing stairs? Y  Comment Due to right knee pains.  Dressing or bathing? N  Doing errands, shopping? N  Preparing Food and eating ? N  Using the Toilet? N  In the past six months, have you accidently leaked urine? N  Do you have problems with loss of bowel control? N  Managing your Medications? N  Managing your Finances? N  Housekeeping or managing your Housekeeping? N  Some recent data might be hidden    Patient  Care Team: Jerrol Banana., MD as PCP - General (Family Medicine) Birder Robson, MD as Referring Physician (Ophthalmology) Dasher, Rayvon Char, MD (Dermatology) Throat, [place] [facility] Nose And  Indicate any recent Medical Services you may have received from other than [facility] providers in the past year (date may be approximate).     Assessment:   This is a routine wellness examination for Morgan Ponce.  Hearing/Vision screen No exam data present  Dietary issues and exercise activities discussed: Current Exercise Habits: Home exercise routine, Type of exercise: walking, Time (Minutes): 10, Frequency (Times/Week): 2, Weekly Exercise (Minutes/Week): 20, Intensity: Mild, Exercise limited by: orthopedic condition(s)  Goals    . DIET - INCREASE WATER INTAKE     Recommend increasing water intake to 6-8 8 oz glasses a day.       Depression Screen PHQ 2/9 Scores 12/18/2020 05/16/2020 11/10/2018 11/10/2018 11/10/2018 09/15/2017 09/15/2017  PHQ - 2 Score 0 0 0 0 0 0 0  PHQ- 9 Score - 0 0 - - 0 -    Fall Risk Fall Risk  12/18/2020 11/16/2019 11/10/2018 11/10/2018 09/15/2017  Falls in the past year? 0 0 0 0 No  Number falls in past yr: 0 0 - - -   Injury with Fall? 0 0 - - -    FALL RISK PREVENTION PERTAINING TO THE HOME:  Any stairs in or around the home? Yes  If so, are there any without handrails? No  Home free of loose throw rugs in walkways, pet beds, electrical cords, etc? Yes  Adequate lighting in your home to reduce risk of falls? Yes   ASSISTIVE DEVICES UTILIZED TO PREVENT FALLS:  Life alert? Yes  Use of a cane, walker or w/c? No  Grab bars in the bathroom? No  Shower chair or bench in shower? No  Elevated toilet seat or a handicapped toilet? No   TIMED UP AND GO:  Was the test performed? Yes .  Length of time to ambulate 10 feet: 8 sec.   Gait steady and fast without use of assistive device  Cognitive Function:     6CIT Screen 11/16/2019 11/10/2018 08/27/2016  What Year? 0 points 0 points 0 points  What month? 0 points 0 points 0 points  What time? 0 points 0 points 0 points  Count back from 20 0 points 0 points 0 points  Months in reverse 0 points 0 points 0 points  Repeat phrase 0 points 0 points 2 points  Total Score 0 0 2    Immunizations Immunization History  Administered Date(s) Administered  . Fluad Quad(high Dose 65+) 06/23/2019, 06/15/2020  . Influenza, High Dose Seasonal PF 06/10/2015, 06/14/2016, 07/01/2017, 07/02/2018  . PFIZER(Purple Top)SARS-COV-2 Vaccination 10/06/2019, 10/27/2019, 07/05/2020  . Pneumococcal Conjugate-13 01/16/2015  . Pneumococcal Polysaccharide-23 08/27/2004  . Tdap 05/27/2011  . Zoster 07/06/2013    TDAP status: Up to date  Flu Vaccine status: Up to date  Pneumococcal vaccine status: Up to date  Covid-19 vaccine status: Completed vaccines  Qualifies for Shingles Vaccine? Yes   Zostavax completed Yes   Shingrix Completed?: No.    Education has been provided regarding the importance of this vaccine. Patient has been advised to call insurance company to determine out of pocket expense if they have not yet received this vaccine. Advised may also receive vaccine  at local pharmacy or Health Dept. Verbalized acceptance and understanding.  Screening Tests Health Maintenance  Topic Date Due  . TETANUS/TDAP  05/26/2021  . DEXA SCAN  05/16/2022  .  INFLUENZA VACCINE  Completed  . COVID-19 Vaccine  Completed  . PNA vac Low Risk Adult  Completed  . HPV VACCINES  Aged Out    Health Maintenance  There are no preventive care reminders to display for this patient.  Colorectal cancer screening: No longer required.   Mammogram status: No longer required due to age.  Bone Density status: Completed 05/16/17. Results reflect: Bone density results: OSTEOPENIA. Repeat every 5 years.  Lung Cancer Screening: (Low Dose CT Chest recommended if Age 74-80 years, 30 pack-year currently smoking OR have quit w/in 15years.) does not qualify.    Additional Screening:  Vision Screening: Recommended annual ophthalmology exams for early detection of glaucoma and other disorders of the eye. Is the patient up to date with their annual eye exam?  Yes  Who is the provider or what is the name of the office in which the patient attends annual eye exams? Dr George Ina @ [place] If pt is not established with a provider, would they like to be referred to a provider to establish care? No .   Dental Screening: Recommended annual dental exams for proper oral hygiene  Community Resource Referral / Chronic Care Management: CRR required this visit?  No   CCM required this visit?  No      Plan:     I have personally reviewed and noted the following in the patient's chart:   . Medical and social history . Use of alcohol, tobacco or illicit drugs  . Current medications and supplements . Functional ability and status . Nutritional status . Physical activity . Advanced directives . List of other physicians . Hospitalizations, surgeries, and ER visits in previous 12 months . Vitals . Screenings to include cognitive, depression, and falls . Referrals and appointments  In  addition, I have reviewed and discussed with patient certain preventive protocols, quality metrics, and best practice recommendations. A written personalized care plan for preventive services as well as general preventive health recommendations were provided to patient.     Morgan Ponce, [place]   [id number]   Nurse Notes: None.

## 2020-12-18 ENCOUNTER — Other Ambulatory Visit: Payer: Self-pay

## 2020-12-18 ENCOUNTER — Ambulatory Visit (INDEPENDENT_AMBULATORY_CARE_PROVIDER_SITE_OTHER): Payer: Medicare HMO

## 2020-12-18 ENCOUNTER — Ambulatory Visit (INDEPENDENT_AMBULATORY_CARE_PROVIDER_SITE_OTHER): Payer: Medicare HMO | Admitting: Family Medicine

## 2020-12-18 VITALS — BP 144/70 | HR 82 | Temp 98.0°F | Ht 63.0 in | Wt 105.8 lb

## 2020-12-18 VITALS — BP 144/70 | HR 82 | Temp 98.0°F | Resp 16 | Ht 63.0 in | Wt 105.0 lb

## 2020-12-18 DIAGNOSIS — Z0000 Encounter for general adult medical examination without abnormal findings: Secondary | ICD-10-CM

## 2020-12-18 DIAGNOSIS — M1990 Unspecified osteoarthritis, unspecified site: Secondary | ICD-10-CM | POA: Diagnosis not present

## 2020-12-18 DIAGNOSIS — E7800 Pure hypercholesterolemia, unspecified: Secondary | ICD-10-CM

## 2020-12-18 DIAGNOSIS — M818 Other osteoporosis without current pathological fracture: Secondary | ICD-10-CM | POA: Diagnosis not present

## 2020-12-18 DIAGNOSIS — I10 Essential (primary) hypertension: Secondary | ICD-10-CM | POA: Diagnosis not present

## 2020-12-18 DIAGNOSIS — I6529 Occlusion and stenosis of unspecified carotid artery: Secondary | ICD-10-CM

## 2020-12-18 MED ORDER — ROSUVASTATIN CALCIUM 20 MG PO TABS: 20.0000 mg | ORAL_TABLET | Freq: Every day | ORAL | 3 refills | Status: DC

## 2020-12-18 NOTE — Patient Instructions (Signed)
Morgan Ponce , Thank you for taking time to come for your Medicare Wellness Visit. I appreciate your ongoing commitment to your health goals. Please review the following plan we discussed and let me know if I can assist you in the future.   Screening recommendations/referrals: Colonoscopy: No longer required.  Mammogram: No longer required.  Bone Density: Up to date, due 04/2022 Recommended yearly ophthalmology/optometry visit for glaucoma screening and checkup Recommended yearly dental visit for hygiene and checkup  Vaccinations: Influenza vaccine: Done 06/15/20 Pneumococcal vaccine: Completed series Tdap vaccine: Up to date, due 04/2021 Shingles vaccine: Shingrix discussed. Please contact your pharmacy for coverage information.     Advanced directives: Currently on file.   Conditions/risks identified: Recommend increasing water intake to 6-8 8 oz glasses a day.   Next appointment: 10:00 AM today with Dr Rosanna Randy    Preventive Care 72 Years and Older, Female Preventive care refers to lifestyle choices and visits with your health care provider that can promote health and wellness. What does preventive care include?  A yearly physical exam. This is also called an annual well check.  Dental exams once or twice a year.  Routine eye exams. Ask your health care provider how often you should have your eyes checked.  Personal lifestyle choices, including:  Daily care of your teeth and gums.  Regular physical activity.  Eating a healthy diet.  Avoiding tobacco and drug use.  Limiting alcohol use.  Practicing safe sex.  Taking low-dose aspirin every day.  Taking vitamin and mineral supplements as recommended by your health care provider. What happens during an annual well check? The services and screenings done by your health care provider during your annual well check will depend on your age, overall health, lifestyle risk factors, and family history of disease. Counseling   Your health care provider may ask you questions about your:  Alcohol use.  Tobacco use.  Drug use.  Emotional well-being.  Home and relationship well-being.  Sexual activity.  Eating habits.  History of falls.  Memory and ability to understand (cognition).  Work and work Statistician.  Reproductive health. Screening  You may have the following tests or measurements:  Height, weight, and BMI.  Blood pressure.  Lipid and cholesterol levels. These may be checked every 5 years, or more frequently if you are over 80 years old.  Skin check.  Lung cancer screening. You may have this screening every year starting at age 30 if you have a 30-pack-year history of smoking and currently smoke or have quit within the past 15 years.  Fecal occult blood test (FOBT) of the stool. You may have this test every year starting at age 71.  Flexible sigmoidoscopy or colonoscopy. You may have a sigmoidoscopy every 5 years or a colonoscopy every 10 years starting at age 42.  Hepatitis C blood test.  Hepatitis B blood test.  Sexually transmitted disease (STD) testing.  Diabetes screening. This is done by checking your blood sugar (glucose) after you have not eaten for a while (fasting). You may have this done every 1-3 years.  Bone density scan. This is done to screen for osteoporosis. You may have this done starting at age 64.  Mammogram. This may be done every 1-2 years. Talk to your health care provider about how often you should have regular mammograms. Talk with your health care provider about your test results, treatment options, and if necessary, the need for more tests. Vaccines  Your health care provider may recommend certain vaccines,  such as:  Influenza vaccine. This is recommended every year.  Tetanus, diphtheria, and acellular pertussis (Tdap, Td) vaccine. You may need a Td booster every 10 years.  Zoster vaccine. You may need this after age 37.  Pneumococcal 13-valent  conjugate (PCV13) vaccine. One dose is recommended after age 15.  Pneumococcal polysaccharide (PPSV23) vaccine. One dose is recommended after age 62. Talk to your health care provider about which screenings and vaccines you need and how often you need them. This information is not intended to replace advice given to you by your health care provider. Make sure you discuss any questions you have with your health care provider. Document Released: 10/13/2015 Document Revised: 06/05/2016 Document Reviewed: 07/18/2015 Elsevier Interactive Patient Education  2017 [place] Prevention in the Home Falls can cause injuries. They can happen to people of all ages. There are many things you can do to make your home safe and to help prevent falls. What can I do on the outside of my home?  Regularly fix the edges of walkways and driveways and fix any cracks.  Remove anything that might make you trip as you walk through a door, such as a raised step or threshold.  Trim any bushes or trees on the path to your home.  Use bright outdoor lighting.  Clear any walking paths of anything that might make someone trip, such as rocks or tools.  Regularly check to see if handrails are loose or broken. Make sure that both sides of any steps have handrails.  Any raised decks and porches should have guardrails on the edges.  Have any leaves, snow, or ice cleared regularly.  Use sand or salt on walking paths during winter.  Clean up any spills in your garage right away. This includes oil or grease spills. What can I do in the bathroom?  Use night lights.  Install grab bars by the toilet and in the tub and shower. Do not use towel bars as grab bars.  Use non-skid mats or decals in the tub or shower.  If you need to sit down in the shower, use a plastic, non-slip stool.  Keep the floor dry. Clean up any water that spills on the floor as soon as it happens.  Remove soap buildup in the tub or  shower regularly.  Attach bath mats securely with double-sided non-slip rug tape.  Do not have throw rugs and other things on the floor that can make you trip. What can I do in the bedroom?  Use night lights.  Make sure that you have a light by your bed that is easy to reach.  Do not use any sheets or blankets that are too big for your bed. They should not hang down onto the floor.  Have a firm chair that has side arms. You can use this for support while you get dressed.  Do not have throw rugs and other things on the floor that can make you trip. What can I do in the kitchen?  Clean up any spills right away.  Avoid walking on wet floors.  Keep items that you use a lot in easy-to-reach places.  If you need to reach something above you, use a strong step stool that has a grab bar.  Keep electrical cords out of the way.  Do not use floor polish or wax that makes floors slippery. If you must use wax, use non-skid floor wax.  Do not have throw rugs and other things on  the floor that can make you trip. What can I do with my stairs?  Do not leave any items on the stairs.  Make sure that there are handrails on both sides of the stairs and use them. Fix handrails that are broken or loose. Make sure that handrails are as long as the stairways.  Check any carpeting to make sure that it is firmly attached to the stairs. Fix any carpet that is loose or worn.  Avoid having throw rugs at the top or bottom of the stairs. If you do have throw rugs, attach them to the floor with carpet tape.  Make sure that you have a light switch at the top of the stairs and the bottom of the stairs. If you do not have them, ask someone to add them for you. What else can I do to help prevent falls?  Wear shoes that:  Do not have high heels.  Have rubber bottoms.  Are comfortable and fit you well.  Are closed at the toe. Do not wear sandals.  If you use a stepladder:  Make sure that it is fully  opened. Do not climb a closed stepladder.  Make sure that both sides of the stepladder are locked into place.  Ask someone to hold it for you, if possible.  Clearly mark and make sure that you can see:  Any grab bars or handrails.  First and last steps.  Where the edge of each step is.  Use tools that help you move around (mobility aids) if they are needed. These include:  Canes.  Walkers.  Scooters.  Crutches.  Turn on the lights when you go into a dark area. Replace any light bulbs as soon as they burn out.  Set up your furniture so you have a clear path. Avoid moving your furniture around.  If any of your floors are uneven, fix them.  If there are any pets around you, be aware of where they are.  Review your medicines with your doctor. Some medicines can make you feel dizzy. This can increase your chance of falling. Ask your doctor what other things that you can do to help prevent falls. This information is not intended to replace advice given to you by your health care provider. Make sure you discuss any questions you have with your health care provider. Document Released: 07/13/2009 Document Revised: 02/22/2016 Document Reviewed: 10/21/2014 Elsevier Interactive Patient Education  2017 [employer].

## 2020-12-18 NOTE — Progress Notes (Signed)
Complete physical exam   Patient: Morgan Ponce   DOB: 05-26-1941   80 y.o. Female  MRN: [id number] Visit Date: 12/18/2020  Today's healthcare provider: [name], MD   Chief Complaint  Patient presents with  . Annual Exam   Subjective    Morgan Ponce is a 80 y.o. female who presents today for a complete physical exam.  She reports consuming a general diet. The patient does not participate in regular exercise at present. She generally feels well. She reports sleeping well. She does not have additional problems to discuss today.  HPI  Patient had AWV with N[facility] today at 9:20 am.  Past Medical History:  Diagnosis Date  . Arthritis   . Dysrhythmia   . HOH (hard of hearing)    AIDS  . Hyperlipidemia   . Hypertension    RESTARTED BP MED SINCE FIRST CATARACT  . Pain    RIGHT KNEE  . Skin cancer of forehead    Past Surgical History:  Procedure Laterality Date  . BREAST BIOPSY Right 08/22/2014   benign. Done by Dr Collene Schlichter  . BREAST EXCISIONAL BIOPSY Right 1972   benign  . CATARACT EXTRACTION W/PHACO Right 05/06/2017   Procedure: CATARACT EXTRACTION PHACO AND INTRAOCULAR LENS PLACEMENT (IOC);  Surgeon: Birder Robson, MD;  Location: [facility] ORS;  Service: Ophthalmology;  Laterality: Right;  [place] 00:40 AP% 17.6 CDE 7.16 Fluid pack lot # [id number]  . CATARACT EXTRACTION W/PHACO Left 09/02/2017   Procedure: CATARACT EXTRACTION PHACO AND INTRAOCULAR LENS PLACEMENT ([place]);  Surgeon: Birder Robson, MD;  Location: [facility] ORS;  Service: Ophthalmology;  Laterality: Left;  [place] 00:39 AP% 15.0 CDE 5.91 Fluid pack lot # [id number]  . CHOLECYSTECTOMY  2013  . COLONOSCOPY N/A 02/06/2015   Procedure: COLONOSCOPY;  Surgeon: Hulen Luster, MD;  Location: [facility];  Service: Gastroenterology;  Laterality: N/A;  . HYSTEROSCOPY  1999   Social History   Socioeconomic History  . Marital status: Widowed    Spouse name: Not on file  . Number of children: 0  . Years of education: Not  on file  . Highest education level: High school graduate  Occupational History  . Occupation: retired  Tobacco Use  . Smoking status: Never Smoker  . Smokeless tobacco: Never Used  Vaping Use  . Vaping Use: Never used  Substance and Sexual Activity  . Alcohol use: No    Alcohol/week: 0.0 standard drinks  . Drug use: No  . Sexual activity: Never  Other Topics Concern  . Not on file  Social History Narrative  . Not on file   Social Determinants of Health   Financial Resource Strain: Low Risk   . Difficulty of Paying Living Expenses: Not hard at all  Food Insecurity: No Food Insecurity  . Worried About Charity fundraiser in the Last Year: Never true  . Ran Out of Food in the Last Year: Never true  Transportation Needs: No Transportation Needs  . Lack of Transportation (Medical): No  . Lack of Transportation (Non-Medical): No  Physical Activity: Insufficiently Active  . Days of Exercise per Week: 2 days  . Minutes of Exercise per Session: 10 min  Stress: No Stress Concern Present  . Feeling of Stress : Not at all  Social Connections: Moderately Isolated  . Frequency of Communication with Friends and Family: More than three times a week  . Frequency of Social Gatherings with Friends and Family: More than three times a week  .  Attends Religious Services: More than 4 times per year  . Active Member of Clubs or Organizations: No  . Attends Archivist Meetings: Never  . Marital Status: Widowed  Intimate Partner Violence: Not At Risk  . Fear of Current or Ex-Partner: No  . Emotionally Abused: No  . Physically Abused: No  . Sexually Abused: No   Family Status  Relation Name Status  . Mother  Deceased at age 26  . Father  Deceased at age 20  . Sister  Deceased  . Brother  Deceased at age 33  . Sister  Deceased   Family History  Problem Relation Age of Onset  . Hypertension Mother   . Stroke Mother   . Heart disease Father   . Hypertension Father   .  Hypertension Sister   . Breast cancer Sister 34  . Heart attack Brother    Allergies  Allergen Reactions  . Cardizem [Diltiazem] Rash  . Diltiazem Hcl Rash    Patient Care Team: Jerrol Banana., MD as PCP - General (Family Medicine) Birder Robson, MD as Referring Physician (Ophthalmology) Dasher, Rayvon Char, MD (Dermatology) Throat, [facility]   Medications: Outpatient Medications Prior to Visit  Medication Sig  . aspirin 81 MG tablet Take 81 mg at bedtime by mouth.   . cephALEXin (KEFLEX) 500 MG capsule Take 1 capsule (500 mg total) by mouth 2 (two) times daily. (Patient not taking: No sig reported)  . hydrochlorothiazide (HYDRODIURIL) 25 MG tablet TAKE 1 TABLET BY MOUTH ONCE DAILY  . losartan (COZAAR) 50 MG tablet TAKE 1 TABLET BY MOUTH ONCE DAILY  . lovastatin (MEVACOR) 10 MG tablet TAKE 1 TABLET BY MOUTH DAILY AT BEDTIME  . Polyvinyl Alcohol-Povidone (REFRESH OP) Place 1 drop 5 (five) times daily as needed into both eyes (DRY EYES).  . predniSONE (STERAPRED UNI-PAK 21 TAB) 10 MG (21) TBPK tablet 6 day taper; take as directed on package instructions (Patient not taking: No sig reported)  . Vitamin D, Cholecalciferol, 1000 UNITS TABS Take 1 tablet by mouth daily.   No facility-administered medications prior to visit.    Review of Systems  All other systems reviewed and are negative.      Objective    BP (!) 144/70   Pulse 82   Temp 98 F (36.7 C)   Resp 16   Ht 5\' 3"  (1.6 m)   Wt 105 lb (47.6 kg)   BMI 18.60 kg/m  Wt Readings from Last 3 Encounters:  12/18/20 105 lb (47.6 kg)  12/18/20 105 lb 12.8 oz (48 kg)  05/16/20 107 lb (48.5 kg)      Physical Exam Vitals reviewed.  Constitutional:      Appearance: She is well-developed.  HENT:     Head: Normocephalic and atraumatic.     Comments: Bilateral hearing aides.    Right Ear: External ear normal.     Left Ear: External ear normal.     Nose: Nose normal.  Eyes:     General: No scleral  icterus.    Conjunctiva/sclera: Conjunctivae normal.  Neck:     Thyroid: No thyromegaly.     Vascular: No carotid bruit.  Cardiovascular:     Rate and Rhythm: Normal rate and regular rhythm.     Heart sounds: Normal heart sounds.  Pulmonary:     Effort: Pulmonary effort is normal.     Breath sounds: Normal breath sounds.  Chest:  Breasts:     Right: Normal.  Left: Normal.    Abdominal:     Palpations: Abdomen is soft.  Musculoskeletal:        General: No tenderness.     Right lower leg: No edema.     Left lower leg: No edema.  Lymphadenopathy:     Cervical: No cervical adenopathy.  Skin:    General: Skin is warm and dry.     Comments: Very fair skin.  Neurological:     General: No focal deficit present.     Mental Status: She is alert and oriented to person, place, and time.  Psychiatric:        Mood and Affect: Mood normal.        Behavior: Behavior normal.        Thought Content: Thought content normal.        Judgment: Judgment normal.       Last depression screening scores PHQ 2/9 Scores 12/18/2020 05/16/2020 11/10/2018  PHQ - 2 Score 0 0 0  PHQ- 9 Score - 0 0   Last fall risk screening Fall Risk  12/18/2020  Falls in the past year? 0  Number falls in past yr: 0  Injury with Fall? 0   Last Audit-C alcohol use screening Alcohol Use Disorder Test (AUDIT) 12/18/2020  1. How often do you have a drink containing alcohol? 0  2. How many drinks containing alcohol do you have on a typical day when you are drinking? 0  3. How often do you have six or more drinks on one occasion? 0  AUDIT-C Score 0  Alcohol Brief Interventions/Follow-up AUDIT Score <7 follow-up not indicated   A score of 3 or more in women, and 4 or more in men indicates increased risk for alcohol abuse, EXCEPT if all of the points are from question 1   No results found for any visits on 12/18/20.  Assessment & Plan    Routine Health Maintenance and Physical Exam  Exercise Activities and  Dietary recommendations Goals    . DIET - INCREASE WATER INTAKE     Recommend increasing water intake to 6-8 8 oz glasses a day.        Immunization History  Administered Date(s) Administered  . Fluad Quad(high Dose 65+) 06/23/2019, 06/15/2020  . Influenza, High Dose Seasonal PF 06/10/2015, 06/14/2016, 07/01/2017, 07/02/2018  . PFIZER(Purple Top)SARS-COV-2 Vaccination 10/06/2019, 10/27/2019, 07/05/2020  . Pneumococcal Conjugate-13 01/16/2015  . Pneumococcal Polysaccharide-23 08/27/2004  . Tdap 05/27/2011  . Zoster 07/06/2013    Health Maintenance  Topic Date Due  . Samul Dada  05/26/2021  . DEXA SCAN  05/16/2022  . INFLUENZA VACCINE  Completed  . COVID-19 Vaccine  Completed  . PNA vac Low Risk Adult  Completed  . HPV VACCINES  Aged Out    Discussed health benefits of physical activity, and encouraged her to engage in regular exercise appropriate for her age and condition. 1. Annual physical exam Patient declines pelvic or breast exam.  Rectal exam not needed. She sees dermatology yearly. 2. Essential hypertension Good control.  She feels much better when her systolics in the 976B to 341P. - TSH - CBC w/Diff/Platelet - Comprehensive Metabolic Panel (CMET)  3. Hypercholesteremia With carotid stenosis would get LDL down a little bit so change lovastatin to rosuvastatin 20 and follow-up in 2 months.  Lab work then. - Lipid panel - TSH - Comprehensive Metabolic Panel (CMET) - rosuvastatin (CRESTOR) 20 MG tablet; Take 1 tablet (20 mg total) by mouth daily.  Dispense: 90 tablet; Refill:  3  4. Osteoarthritis, unspecified osteoarthritis type, unspecified site   5. Other osteoporosis without current pathological fracture BMD later in the year.  6. Stenosis of carotid artery, unspecified laterality Treat lipids aggressively.  Carotid Doppler was normal with less than 50% stenosis. - rosuvastatin (CRESTOR) 20 MG tablet; Take 1 tablet (20 mg total) by mouth daily.   Dispense: 90 tablet; Refill: 3    No follow-ups on file.     I, [name], MD, have reviewed all documentation for this visit. The documentation on 12/18/20 for the exam, diagnosis, procedures, and orders are all accurate and complete.    Brylee Berk Cranford Mon, MD  [facility] [phone] (phone) [phone] (fax)  [place]

## 2020-12-19 ENCOUNTER — Ambulatory Visit
Admission: RE | Admit: 2020-12-19 | Discharge: 2020-12-19 | Disposition: A | Discharge status: Home / Self Care | Payer: Medicare HMO | Source: Ambulatory Visit | Attending: Family Medicine | Admitting: Family Medicine

## 2020-12-19 ENCOUNTER — Other Ambulatory Visit: Payer: Self-pay

## 2020-12-19 DIAGNOSIS — E7800 Pure hypercholesterolemia, unspecified: Secondary | ICD-10-CM | POA: Diagnosis not present

## 2020-12-19 DIAGNOSIS — I10 Essential (primary) hypertension: Secondary | ICD-10-CM | POA: Diagnosis not present

## 2020-12-19 DIAGNOSIS — Z1231 Encounter for screening mammogram for malignant neoplasm of breast: Secondary | ICD-10-CM | POA: Diagnosis not present

## 2020-12-20 LAB — COMPREHENSIVE METABOLIC PANEL
ALT: 11 IU/L (ref 0–32)
AST: 30 IU/L (ref 0–40)
Albumin/Globulin Ratio: 1.5 (ref 1.2–2.2)
Albumin: 4.2 g/dL (ref 3.6–4.6)
Alkaline Phosphatase: 54 IU/L (ref 44–121)
BUN/Creatinine Ratio: 18 (ref 12–28)
BUN: 26 mg/dL (ref 8–27)
Bilirubin Total: 0.4 mg/dL (ref 0.0–1.2)
CO2: 23 mmol/L (ref 20–29)
Calcium: 9.7 mg/dL (ref 8.7–10.3)
Chloride: 99 mmol/L (ref 96–106)
Creatinine, Ser: 1.41 mg/dL — ABNORMAL HIGH (ref 0.57–1.00)
Globulin, Total: 2.8 g/dL (ref 1.5–4.5)
Glucose: 102 mg/dL — ABNORMAL HIGH (ref 65–99)
Potassium: 4.3 mmol/L (ref 3.5–5.2)
Sodium: 139 mmol/L (ref 134–144)
Total Protein: 7 g/dL (ref 6.0–8.5)
eGFR: 37 mL/min/{1.73_m2} — ABNORMAL LOW (ref >59)

## 2020-12-20 LAB — CBC WITH DIFFERENTIAL/PLATELET
Basophils Absolute: 0 10*3/uL (ref 0.0–0.2)
Basos: 1 %
EOS (ABSOLUTE): 0.1 10*3/uL (ref 0.0–0.4)
Eos: 2 %
Hematocrit: 32.5 % — ABNORMAL LOW (ref 34.0–46.6)
Hemoglobin: 11 g/dL — ABNORMAL LOW (ref 11.1–15.9)
Immature Grans (Abs): 0 10*3/uL (ref 0.0–0.1)
Immature Granulocytes: 0 %
Lymphocytes Absolute: 1.3 10*3/uL (ref 0.7–3.1)
Lymphs: 28 %
MCH: 30.7 pg (ref 26.6–33.0)
MCHC: 33.8 g/dL (ref 31.5–35.7)
MCV: 91 fL (ref 79–97)
Monocytes Absolute: 0.4 10*3/uL (ref 0.1–0.9)
Monocytes: 10 %
Neutrophils Absolute: 2.7 10*3/uL (ref 1.4–7.0)
Neutrophils: 59 %
Platelets: 198 10*3/uL (ref 150–450)
RBC: 3.58 x10E6/uL — ABNORMAL LOW (ref 3.77–5.28)
RDW: 13.6 % (ref 11.7–15.4)
WBC: 4.6 10*3/uL (ref 3.4–10.8)

## 2020-12-20 LAB — LIPID PANEL
Chol/HDL Ratio: 2.4 ratio (ref 0.0–4.4)
Cholesterol, Total: 173 mg/dL (ref 100–199)
HDL: 71 mg/dL (ref >39)
LDL Chol Calc (NIH): 90 mg/dL (ref 0–99)
Triglycerides: 63 mg/dL (ref 0–149)
VLDL Cholesterol Cal: 12 mg/dL (ref 5–40)

## 2020-12-20 LAB — TSH: TSH: 4.64 u[IU]/mL — ABNORMAL HIGH (ref 0.450–4.500)

## 2020-12-25 DIAGNOSIS — H903 Sensorineural hearing loss, bilateral: Secondary | ICD-10-CM | POA: Diagnosis not present

## 2021-02-27 ENCOUNTER — Other Ambulatory Visit: Payer: Self-pay | Admitting: Family Medicine

## 2021-02-27 ENCOUNTER — Telehealth: Payer: Self-pay

## 2021-02-27 DIAGNOSIS — I10 Essential (primary) hypertension: Secondary | ICD-10-CM

## 2021-02-27 NOTE — Telephone Encounter (Signed)
They don't make a 50-25 combination pill

## 2021-02-27 NOTE — Telephone Encounter (Signed)
Please review and advise. Patient is requesting a combo pill for Losartan 50mg  and HCTZ 25mg .   Morgan Ponce

## 2021-02-27 NOTE — Telephone Encounter (Signed)
Patient advised.

## 2021-02-27 NOTE — Telephone Encounter (Signed)
Copied from [place] [id number]. Topic: Quick Communication - Rx Refill/Question >> Feb 27, 2021  9:53 AM Loma Boston wrote: Medication: losartan (COZAAR) 50 MG tablet 90 tablet 1 09/05/2020   Sig: TAKE 1 TABLET BY MOUTH ONCE DAILY  hydrochlorothiazide (HYDRODIURIL) 25 MG tablet 90 tablet 3 06/08/2020   Sig: TAKE 1 TABLET BY MOUTH ONCE DAILY   Pt was told by her pharmacy that the next refill to ask the dr if he can combine these two meds as is now available, Pt says if ok with Dr Darnell Level could he call in the combo pill for her refill that is due.    Has the patient contacted their pharmacy? {yes (Agent: If no, request that the patient contact the pharmacy for the refill.) call dr    Preferred Pharmacy (with phone number or street name): [place], [place] - [address]  Phone:  [phone] Fax:  [phone]     Agent: Please be advised that RX refills may take up to 3 business days. We ask that you follow-up with your pharmacy.

## 2021-03-20 ENCOUNTER — Other Ambulatory Visit: Payer: Self-pay

## 2021-03-20 ENCOUNTER — Ambulatory Visit (INDEPENDENT_AMBULATORY_CARE_PROVIDER_SITE_OTHER): Payer: Medicare HMO | Admitting: Family Medicine

## 2021-03-20 ENCOUNTER — Encounter: Payer: Self-pay | Admitting: Family Medicine

## 2021-03-20 VITALS — BP 155/75 | HR 82 | Temp 98.3°F | Resp 18 | Ht 63.0 in | Wt 103.0 lb

## 2021-03-20 DIAGNOSIS — I6522 Occlusion and stenosis of left carotid artery: Secondary | ICD-10-CM

## 2021-03-20 DIAGNOSIS — E7800 Pure hypercholesterolemia, unspecified: Secondary | ICD-10-CM | POA: Diagnosis not present

## 2021-03-20 DIAGNOSIS — N289 Disorder of kidney and ureter, unspecified: Secondary | ICD-10-CM | POA: Diagnosis not present

## 2021-03-20 DIAGNOSIS — M818 Other osteoporosis without current pathological fracture: Secondary | ICD-10-CM | POA: Diagnosis not present

## 2021-03-20 DIAGNOSIS — D649 Anemia, unspecified: Secondary | ICD-10-CM

## 2021-03-20 DIAGNOSIS — I10 Essential (primary) hypertension: Secondary | ICD-10-CM

## 2021-03-20 NOTE — Progress Notes (Signed)
I,[name],acting as a scribe for Morgan Durie, MD.,have documented all relevant documentation on the behalf of Morgan Durie, MD,as directed by  Morgan Durie, MD while in the presence of Morgan Durie, MD.   Established patient visit   Patient: Morgan Ponce   DOB: 03-Jul-1941   80 y.o. Female  MRN: [id number] Visit Date: 03/20/2021  Today's healthcare provider: [name], MD   Chief Complaint  Patient presents with   Follow-up   Hypertension   Hyperlipidemia   Subjective    HPI  Patient comes in today for follow-up.  She is doing fairly fairly well.  Is adjusting to being a widow and then tries to remain active. She is taking all her medications including rosuvastatin. Patient has had no recent falls and last BMD was 2 years ago and lowest T score was -2.1. Hypertension, follow-up  BP Readings from Last 3 Encounters:  03/20/21 (!) 155/75  12/18/20 (!) 144/70  12/18/20 (!) 144/70   Wt Readings from Last 3 Encounters:  03/20/21 103 lb (46.7 kg)  12/18/20 105 lb (47.6 kg)  12/18/20 105 lb 12.8 oz (48 kg)     She was last seen for hypertension 3 months ago.  BP at that visit was 144/70. Management since that visit includes; Good control. On HCTZ and losartan. She reports good compliance with treatment. She is not having side effects. none She is exercising. She is adherent to low salt diet.   Outside blood pressures are not checking.  She does not smoke.  Use of agents associated with hypertension: none.   ----------------------------------------------------------------------------  Lipid/Cholesterol, follow-up  Last Lipid Panel: Lab Results  Component Value Date   CHOL 173 12/19/2020   LDLCALC 90 12/19/2020   HDL 71 12/19/2020   TRIG 63 12/19/2020    She was last seen for this 3 months ago.  Management since that visit includes; started rosuvastatin 20 mg.  She reports good compliance with treatment. She is not  having side effects. none  She is following a Regular, Low Sodium diet. Current exercise: walking  Last metabolic panel Lab Results  Component Value Date   GLUCOSE 102 (H) 12/19/2020   NA 139 12/19/2020   K 4.3 12/19/2020   BUN 26 12/19/2020   CREATININE 1.41 (H) 12/19/2020   GFRNONAA 46 (L) 11/16/2019   GFRAA 52 (L) 11/16/2019   CALCIUM 9.7 12/19/2020   AST 30 12/19/2020   ALT 11 12/19/2020   The ASCVD Risk score Mikey Bussing DC Jr., et al., 2013) failed to calculate for the following reasons:   The 2013 ASCVD risk score is only valid for ages 82 to 20  ----------------------------------------------------------------------------  Patient states she is doing well on rosuvastatin.      Medications: Outpatient Medications Prior to Visit  Medication Sig   aspirin 81 MG tablet Take 81 mg at bedtime by mouth.    hydrochlorothiazide (HYDRODIURIL) 25 MG tablet TAKE 1 TABLET BY MOUTH ONCE DAILY   losartan (COZAAR) 50 MG tablet TAKE 1 TABLET BY MOUTH ONCE DAILY   Polyvinyl Alcohol-Povidone (REFRESH OP) Place 1 drop 5 (five) times daily as needed into both eyes (DRY EYES).   rosuvastatin (CRESTOR) 20 MG tablet Take 1 tablet (20 mg total) by mouth daily.   Vitamin D, Cholecalciferol, 1000 UNITS TABS Take 1 tablet by mouth daily.   cephALEXin (KEFLEX) 500 MG capsule Take 1 capsule (500 mg total) by mouth 2 (two) times daily. (Patient not taking: No  sig reported)   predniSONE (STERAPRED UNI-PAK 21 TAB) 10 MG (21) TBPK tablet 6 day taper; take as directed on package instructions (Patient not taking: No sig reported)   No facility-administered medications prior to visit.    Review of Systems  Constitutional:  Negative for appetite change, chills, fatigue and fever.  Respiratory:  Negative for chest tightness and shortness of breath.   Cardiovascular:  Negative for chest pain and palpitations.  Gastrointestinal:  Negative for abdominal pain, nausea and vomiting.  Neurological:  Negative for  dizziness and weakness.       Objective    BP (!) 155/75 (BP Location: Right Arm, Patient Position: Sitting, Cuff Size: Normal)   Pulse 82   Temp 98.3 F (36.8 C) (Oral)   Resp 18   Ht 5\' 3"  (1.6 m)   Wt 103 lb (46.7 kg)   SpO2 98%   BMI 18.25 kg/m  BP Readings from Last 3 Encounters:  03/20/21 (!) 155/75  12/18/20 (!) 144/70  12/18/20 (!) 144/70   Wt Readings from Last 3 Encounters:  03/20/21 103 lb (46.7 kg)  12/18/20 105 lb (47.6 kg)  12/18/20 105 lb 12.8 oz (48 kg)       Physical Exam Vitals reviewed.  Constitutional:      Appearance: She is well-developed.  HENT:     Head: Normocephalic and atraumatic.     Right Ear: External ear normal.     Left Ear: External ear normal.     Nose: Nose normal.  Eyes:     General: No scleral icterus.    Conjunctiva/sclera: Conjunctivae normal.  Neck:     Thyroid: No thyromegaly.  Cardiovascular:     Rate and Rhythm: Normal rate and regular rhythm.     Heart sounds: Normal heart sounds.  Pulmonary:     Effort: Pulmonary effort is normal.     Breath sounds: Normal breath sounds.  Chest:  Breasts:    Right: Normal.     Left: Normal.  Abdominal:     Palpations: Abdomen is soft.  Musculoskeletal:        General: No tenderness.     Right lower leg: No edema.     Left lower leg: No edema.  Lymphadenopathy:     Cervical: No cervical adenopathy.  Skin:    General: Skin is warm and dry.  Neurological:     General: No focal deficit present.     Mental Status: She is alert and oriented to person, place, and time.  Psychiatric:        Mood and Affect: Mood normal.        Behavior: Behavior normal.        Thought Content: Thought content normal.        Judgment: Judgment normal.      No results found for any visits on 03/20/21.  Assessment & Plan     1. Essential hypertension [place] hypertension.  Continue losartan at 50 mg daily and HCTZ 25 mg every morning.  Blood pressure readings from home on next visit.   consideration increasing losartan dose - Lipid panel - CBC w/Diff/Platelet - Comprehensive Metabolic Panel (CMET) - TSH  2. Hypercholesteremia On rosuvastatin 20 - Lipid panel - CBC w/Diff/Platelet - Comprehensive Metabolic Panel (CMET) - TSH  3. Anemia, unspecified type History of anemia.  Follow-up CBC - Lipid panel - CBC w/Diff/Platelet - Comprehensive Metabolic Panel (CMET) - TSH  4. Function kidney decreased Hypertensive nephropathy. - Comprehensive Metabolic Panel (CMET)  5.  Stenosis of left carotid artery Risk factors treated.  6. Other osteoporosis without current pathological fracture Schedule BMD later this year.   Return in about 6 months (around 09/19/2021).      I, Morgan Durie, MD, have reviewed all documentation for this visit. The documentation on 03/25/21 for the exam, diagnosis, procedures, and orders are all accurate and complete.    Verlee Pope Cranford Mon, MD  [facility] [phone] (phone) [phone] (fax)  [place]

## 2021-03-21 LAB — COMPREHENSIVE METABOLIC PANEL
ALT: 11 IU/L (ref 0–32)
AST: 27 IU/L (ref 0–40)
Albumin/Globulin Ratio: 1.6 (ref 1.2–2.2)
Albumin: 4.3 g/dL (ref 3.6–4.6)
Alkaline Phosphatase: 54 IU/L (ref 44–121)
BUN/Creatinine Ratio: 23 (ref 12–28)
BUN: 28 mg/dL — ABNORMAL HIGH (ref 8–27)
Bilirubin Total: 0.5 mg/dL (ref 0.0–1.2)
CO2: 25 mmol/L (ref 20–29)
Calcium: 9.8 mg/dL (ref 8.7–10.3)
Chloride: 97 mmol/L (ref 96–106)
Creatinine, Ser: 1.23 mg/dL — ABNORMAL HIGH (ref 0.57–1.00)
Globulin, Total: 2.7 g/dL (ref 1.5–4.5)
Glucose: 97 mg/dL (ref 65–99)
Potassium: 4.5 mmol/L (ref 3.5–5.2)
Sodium: 138 mmol/L (ref 134–144)
Total Protein: 7 g/dL (ref 6.0–8.5)
eGFR: 44 mL/min/{1.73_m2} — ABNORMAL LOW (ref >59)

## 2021-03-21 LAB — CBC WITH DIFFERENTIAL/PLATELET
Basophils Absolute: 0 10*3/uL (ref 0.0–0.2)
Basos: 1 %
EOS (ABSOLUTE): 0.1 10*3/uL (ref 0.0–0.4)
Eos: 2 %
Hematocrit: 33.1 % — ABNORMAL LOW (ref 34.0–46.6)
Hemoglobin: 10.8 g/dL — ABNORMAL LOW (ref 11.1–15.9)
Immature Grans (Abs): 0 10*3/uL (ref 0.0–0.1)
Immature Granulocytes: 0 %
Lymphocytes Absolute: 1.1 10*3/uL (ref 0.7–3.1)
Lymphs: 24 %
MCH: 29.4 pg (ref 26.6–33.0)
MCHC: 32.6 g/dL (ref 31.5–35.7)
MCV: 90 fL (ref 79–97)
Monocytes Absolute: 0.5 10*3/uL (ref 0.1–0.9)
Monocytes: 10 %
Neutrophils Absolute: 2.9 10*3/uL (ref 1.4–7.0)
Neutrophils: 63 %
Platelets: 194 10*3/uL (ref 150–450)
RBC: 3.67 x10E6/uL — ABNORMAL LOW (ref 3.77–5.28)
RDW: 14.1 % (ref 11.7–15.4)
WBC: 4.6 10*3/uL (ref 3.4–10.8)

## 2021-03-21 LAB — LIPID PANEL
Chol/HDL Ratio: 2.2 ratio (ref 0.0–4.4)
Cholesterol, Total: 150 mg/dL (ref 100–199)
HDL: 69 mg/dL (ref >39)
LDL Chol Calc (NIH): 68 mg/dL (ref 0–99)
Triglycerides: 65 mg/dL (ref 0–149)
VLDL Cholesterol Cal: 13 mg/dL (ref 5–40)

## 2021-03-21 LAB — TSH: TSH: 3.16 u[IU]/mL (ref 0.450–4.500)

## 2021-03-28 ENCOUNTER — Telehealth: Payer: Self-pay

## 2021-03-28 NOTE — Telephone Encounter (Signed)
Pt. Given lab results, verbalizes understanding. 

## 2021-05-22 ENCOUNTER — Other Ambulatory Visit: Payer: Self-pay | Admitting: Family Medicine

## 2021-05-22 DIAGNOSIS — I10 Essential (primary) hypertension: Secondary | ICD-10-CM

## 2021-06-15 DIAGNOSIS — H35371 Puckering of macula, right eye: Secondary | ICD-10-CM | POA: Diagnosis not present

## 2021-06-20 ENCOUNTER — Ambulatory Visit (INDEPENDENT_AMBULATORY_CARE_PROVIDER_SITE_OTHER): Payer: Medicare HMO

## 2021-06-20 ENCOUNTER — Other Ambulatory Visit: Payer: Self-pay

## 2021-06-20 DIAGNOSIS — Z23 Encounter for immunization: Secondary | ICD-10-CM

## 2021-06-20 DIAGNOSIS — L821 Other seborrheic keratosis: Secondary | ICD-10-CM | POA: Diagnosis not present

## 2021-06-20 DIAGNOSIS — D2271 Melanocytic nevi of right lower limb, including hip: Secondary | ICD-10-CM | POA: Diagnosis not present

## 2021-06-20 DIAGNOSIS — D2261 Melanocytic nevi of right upper limb, including shoulder: Secondary | ICD-10-CM | POA: Diagnosis not present

## 2021-06-20 DIAGNOSIS — Z09 Encounter for follow-up examination after completed treatment for conditions other than malignant neoplasm: Secondary | ICD-10-CM | POA: Diagnosis not present

## 2021-06-20 DIAGNOSIS — D2272 Melanocytic nevi of left lower limb, including hip: Secondary | ICD-10-CM | POA: Diagnosis not present

## 2021-06-20 DIAGNOSIS — Z08 Encounter for follow-up examination after completed treatment for malignant neoplasm: Secondary | ICD-10-CM | POA: Diagnosis not present

## 2021-06-20 DIAGNOSIS — Z872 Personal history of diseases of the skin and subcutaneous tissue: Secondary | ICD-10-CM | POA: Diagnosis not present

## 2021-06-20 DIAGNOSIS — Z85828 Personal history of other malignant neoplasm of skin: Secondary | ICD-10-CM | POA: Diagnosis not present

## 2021-06-20 DIAGNOSIS — D2262 Melanocytic nevi of left upper limb, including shoulder: Secondary | ICD-10-CM | POA: Diagnosis not present

## 2021-06-20 DIAGNOSIS — L570 Actinic keratosis: Secondary | ICD-10-CM | POA: Diagnosis not present

## 2021-06-20 DIAGNOSIS — D225 Melanocytic nevi of trunk: Secondary | ICD-10-CM | POA: Diagnosis not present

## 2021-08-21 ENCOUNTER — Other Ambulatory Visit: Payer: Self-pay | Admitting: Family Medicine

## 2021-08-21 DIAGNOSIS — I10 Essential (primary) hypertension: Secondary | ICD-10-CM

## 2021-08-21 NOTE — Telephone Encounter (Signed)
Appointment 09/19/21 Requested Prescriptions  Pending Prescriptions Disp Refills  . losartan (COZAAR) 50 MG tablet [Pharmacy Med Name: LOSARTAN POTASSIUM 50 MG TAB] 90 tablet 1    Sig: TAKE 1 TABLET BY MOUTH ONCE DAILY     Cardiovascular:  Angiotensin Receptor Blockers Failed - 08/21/2021 11:39 AM      Failed - Cr in normal range and within 180 days    Creatinine  Date Value Ref Range Status  02/12/2012 0.87 0.60 - 1.30 mg/dL Final   Creatinine, Ser  Date Value Ref Range Status  03/20/2021 1.23 (H) 0.57 - 1.00 mg/dL Final         Failed - Last BP in normal range    BP Readings from Last 1 Encounters:  03/20/21 (!) 155/75         Passed - K in normal range and within 180 days    Potassium  Date Value Ref Range Status  03/20/2021 4.5 3.5 - 5.2 mmol/L Final  02/12/2012 4.8 3.5 - 5.1 mmol/L Final         Passed - Patient is not pregnant      Passed - Valid encounter within last 6 months    Recent Outpatient Visits          5 months ago Essential hypertension   [facility] Jerrol Banana., MD   8 months ago Annual physical exam   [facility] Jerrol Banana., MD   1 year ago Essential hypertension   [facility] Jerrol Banana., MD   1 year ago [facility] dermatitis   [place], [place], [place]   1 year ago Contusion of left orbital tissues, initial encounter   [facility] Jerrol Banana., MD      Future Appointments            In 4 weeks Jerrol Banana., MD [facility], [place]   In 4 months Jerrol Banana., MD [facility], [place]

## 2021-09-19 ENCOUNTER — Ambulatory Visit: Payer: Medicare HMO | Admitting: Family Medicine

## 2021-09-19 ENCOUNTER — Ambulatory Visit (INDEPENDENT_AMBULATORY_CARE_PROVIDER_SITE_OTHER): Payer: Medicare HMO | Admitting: Physician Assistant

## 2021-09-19 ENCOUNTER — Other Ambulatory Visit: Payer: Self-pay

## 2021-09-19 ENCOUNTER — Ambulatory Visit: Payer: Self-pay | Admitting: Family Medicine

## 2021-09-19 ENCOUNTER — Encounter: Payer: Self-pay | Admitting: Physician Assistant

## 2021-09-19 VITALS — BP 149/69 | HR 75 | Temp 97.8°F | Ht 63.0 in | Wt 99.2 lb

## 2021-09-19 DIAGNOSIS — D649 Anemia, unspecified: Secondary | ICD-10-CM

## 2021-09-19 DIAGNOSIS — I10 Essential (primary) hypertension: Secondary | ICD-10-CM

## 2021-09-19 DIAGNOSIS — Z13820 Encounter for screening for osteoporosis: Secondary | ICD-10-CM

## 2021-09-19 DIAGNOSIS — I129 Hypertensive chronic kidney disease with stage 1 through stage 4 chronic kidney disease, or unspecified chronic kidney disease: Secondary | ICD-10-CM

## 2021-09-19 DIAGNOSIS — M8580 Other specified disorders of bone density and structure, unspecified site: Secondary | ICD-10-CM

## 2021-09-19 MED ORDER — BLOOD PRESSURE KIT DEVI: 0 refills | Status: DC

## 2021-09-19 NOTE — Assessment & Plan Note (Signed)
Historically documented as white coat HTN. Initially 155/75 repeat improved 149/69  Advised pt strongly to get BP machine for home, to check twice a day, in AM and PM 2-3 times a week.  F/u 2-3 mo

## 2021-09-19 NOTE — Progress Notes (Signed)
°  ° ° °Established patient visit ° ° °Patient: Morgan Ponce   DOB: 09/07/1941   80 y.o. Female  MRN: [id number] °Visit Date: 09/19/2021 ° °Today's healthcare provider: Lindsay Drubel, PA-C  ° °Cc. Htn f/u ° °Subjective  °  °HPI  °Hypertension, follow-up ° °BP Readings from Last 3 Encounters:  °09/19/21 (!) 149/69  °03/20/21 (!) 155/75  °12/18/20 (!) 144/70  ° Wt Readings from Last 3 Encounters:  °09/19/21 99 lb 3.2 oz (45 kg)  °03/20/21 103 lb (46.7 kg)  °12/18/20 105 lb (47.6 kg)  °  ° °She was last seen for hypertension 6 months ago.  °BP at that visit was 155/75. Management since that visit includes continue losartan at 50 mg daily and HCTZ 25 mg every morning and to start checking BP at home ° °She reports excellent compliance with treatment in terms of medication. Pt states her BP machine at home does not work. She knows she needs to get a new one. °She is not having side effects. °She is following a Low Sodium diet. °She is exercising. °She does not smoke. ° °Use of agents associated with hypertension: none.  ° °Outside blood pressures are none. °Symptoms: °No chest pain No chest pressure  °No palpitations No syncope  °No dyspnea No orthopnea  °No paroxysmal nocturnal dyspnea No lower extremity edema  ° °Pertinent labs: °Lab Results  °Component Value Date  ° CHOL 150 03/20/2021  ° HDL 69 03/20/2021  ° LDLCALC 68 03/20/2021  ° TRIG 65 03/20/2021  ° CHOLHDL 2.2 03/20/2021  ° Lab Results  °Component Value Date  ° NA 138 03/20/2021  ° K 4.5 03/20/2021  ° CREATININE 1.23 (H) 03/20/2021  ° EGFR 44 (L) 03/20/2021  ° GLUCOSE 97 03/20/2021  ° TSH 3.160 03/20/2021  °  ° °The ASCVD Risk score (Arnett DK, et al., 2019) failed to calculate for the following reasons: °  The 2019 ASCVD risk score is only valid for ages 40 to 79   ° °--------------------------------------------------------------------------------------- ° °--------------------------------------------------------------------------------------- ° °Medications: °Outpatient Medications Prior to Visit  °Medication Sig  ° aspirin 81 MG tablet Take 81 mg at bedtime by mouth.   ° hydrochlorothiazide (HYDRODIURIL) 25 MG tablet TAKE 1 TABLET BY MOUTH ONCE DAILY  ° losartan (COZAAR) 50 MG tablet TAKE 1 TABLET BY MOUTH ONCE DAILY  ° Polyvinyl Alcohol-Povidone (REFRESH OP) Place 1 drop 5 (five) times daily as needed into both eyes (DRY EYES).  ° rosuvastatin (CRESTOR) 20 MG tablet Take 1 tablet (20 mg total) by mouth daily.  ° Vitamin D, Cholecalciferol, 1000 UNITS TABS Take 1 tablet by mouth daily.  ° [DISCONTINUED] cephALEXin (KEFLEX) 500 MG capsule Take 1 capsule (500 mg total) by mouth 2 (two) times daily. (Patient not taking: Reported on 05/16/2020)  ° [DISCONTINUED] predniSONE (STERAPRED UNI-PAK 21 TAB) 10 MG (21) TBPK tablet 6 day taper; take as directed on package instructions  ° °No facility-administered medications prior to visit.  ° ° °Review of Systems  °Constitutional:  Negative for fatigue and fever.  °Respiratory:  Negative for cough, shortness of breath and wheezing.   °Cardiovascular:  Negative for chest pain and leg swelling.  ° °Last CBC °Lab Results  °Component Value Date  ° WBC 4.6 03/20/2021  ° HGB 10.8 (L) 03/20/2021  ° HCT 33.1 (L) 03/20/2021  ° MCV 90 03/20/2021  ° MCH 29.4 03/20/2021  ° RDW 14.1 03/20/2021  ° PLT 194 03/20/2021  ° °Last metabolic panel °Lab Results  °Component Value Date  °   GLUCOSE 97 03/20/2021   NA 138 03/20/2021   K 4.5 03/20/2021   CL 97 03/20/2021   CO2 25 03/20/2021   BUN 28 (H) 03/20/2021   CREATININE 1.23 (H) 03/20/2021   EGFR 44 (L) 03/20/2021   CALCIUM 9.8 03/20/2021   PHOS 3.6 04/21/2017   PROT 7.0 03/20/2021   ALBUMIN 4.3 03/20/2021   LABGLOB 2.7 03/20/2021   AGRATIO 1.6 03/20/2021   BILITOT 0.5 03/20/2021   ALKPHOS 54  03/20/2021   AST 27 03/20/2021   ALT 11 03/20/2021   ANIONGAP 5 (L) 02/12/2012   Last lipids Lab Results  Component Value Date   CHOL 150 03/20/2021   HDL 69 03/20/2021   LDLCALC 68 03/20/2021   TRIG 65 03/20/2021   CHOLHDL 2.2 03/20/2021    Last thyroid functions Lab Results  Component Value Date   TSH 3.160 03/20/2021   Last vitamin D Lab Results  Component Value Date   VD25OH 45.0 03/08/2019       Objective    BP (!) 149/69 (BP Location: Right Arm, Patient Position: Sitting, Cuff Size: Normal)    Pulse 75    Temp 97.8 F (36.6 C) (Oral)    Ht 5' 3" (1.6 m)    Wt 99 lb 3.2 oz (45 kg)    SpO2 97%    BMI 17.57 kg/m  BP Readings from Last 3 Encounters:  09/19/21 (!) 149/69  03/20/21 (!) 155/75  12/18/20 (!) 144/70   Wt Readings from Last 3 Encounters:  09/19/21 99 lb 3.2 oz (45 kg)  03/20/21 103 lb (46.7 kg)  12/18/20 105 lb (47.6 kg)      Physical Exam Constitutional:      General: She is awake.     Appearance: She is well-developed.  HENT:     Head: Normocephalic.  Eyes:     Conjunctiva/sclera: Conjunctivae normal.  Cardiovascular:     Rate and Rhythm: Normal rate and regular rhythm.     Heart sounds: Normal heart sounds.  Pulmonary:     Effort: Pulmonary effort is normal.     Breath sounds: Normal breath sounds.  Skin:    General: Skin is warm.  Neurological:     Mental Status: She is alert and oriented to person, place, and time.  Psychiatric:        Attention and Perception: Attention normal.        Mood and Affect: Mood normal.        Speech: Speech normal.        Behavior: Behavior is cooperative.     No results found for any visits on 09/19/21.  Assessment & Plan     Problem List Items Addressed This Visit       Cardiovascular and Mediastinum   BP (high blood pressure) - Primary    Historically documented as white coat HTN. Initially 155/75 repeat improved 149/69  Advised pt strongly to get BP machine for home, to check twice a day,  in AM and PM 2-3 times a week.  F/u 2-3 mo      Relevant Medications   Blood Pressure Monitoring (BLOOD PRESSURE KIT) DEVI   Other Relevant Orders   Comprehensive Metabolic Panel (CMET)   Other Visit Diagnoses     Anemia, unspecified type       Relevant Orders   CBC   Hypertensive kidney disease       Relevant Orders   Comprehensive Metabolic Panel (CMET)   Screening for osteoporosis  Relevant Orders   DG Bone Density   Osteopenia, unspecified location       Relevant Orders   DG Bone Density      Repeat labs per Dr Rosanna Randy to assess CKD and anemia stability  Return as scheduled, for hypertension.      I, Mikey Kirschner, PA-C have reviewed all documentation for this visit. The documentation on  09/19/2021 for the exam, diagnosis, procedures, and orders are all accurate and complete.    Mikey Kirschner, PA-C  [facility] [phone] (phone) [phone] (fax)  [place]

## 2021-09-20 ENCOUNTER — Other Ambulatory Visit: Payer: Self-pay | Admitting: Physician Assistant

## 2021-09-20 ENCOUNTER — Other Ambulatory Visit: Payer: Self-pay

## 2021-09-20 DIAGNOSIS — I10 Essential (primary) hypertension: Secondary | ICD-10-CM

## 2021-09-20 DIAGNOSIS — E871 Hypo-osmolality and hyponatremia: Secondary | ICD-10-CM

## 2021-09-20 LAB — CBC
Hematocrit: 32.5 % — ABNORMAL LOW (ref 34.0–46.6)
Hemoglobin: 10.6 g/dL — ABNORMAL LOW (ref 11.1–15.9)
MCH: 29.1 pg (ref 26.6–33.0)
MCHC: 32.6 g/dL (ref 31.5–35.7)
MCV: 89 fL (ref 79–97)
Platelets: 196 10*3/uL (ref 150–450)
RBC: 3.64 x10E6/uL — ABNORMAL LOW (ref 3.77–5.28)
RDW: 14.6 % (ref 11.7–15.4)
WBC: 4.5 10*3/uL (ref 3.4–10.8)

## 2021-09-20 LAB — COMPREHENSIVE METABOLIC PANEL
ALT: 11 IU/L (ref 0–32)
AST: 32 IU/L (ref 0–40)
Albumin/Globulin Ratio: 1.4 (ref 1.2–2.2)
Albumin: 4.2 g/dL (ref 3.6–4.6)
Alkaline Phosphatase: 57 IU/L (ref 44–121)
BUN/Creatinine Ratio: 15 (ref 12–28)
BUN: 16 mg/dL (ref 8–27)
Bilirubin Total: 0.7 mg/dL (ref 0.0–1.2)
CO2: 24 mmol/L (ref 20–29)
Calcium: 9.5 mg/dL (ref 8.7–10.3)
Chloride: 91 mmol/L — ABNORMAL LOW (ref 96–106)
Creatinine, Ser: 1.06 mg/dL — ABNORMAL HIGH (ref 0.57–1.00)
Globulin, Total: 2.9 g/dL (ref 1.5–4.5)
Glucose: 100 mg/dL — ABNORMAL HIGH (ref 70–99)
Potassium: 4.2 mmol/L (ref 3.5–5.2)
Sodium: 129 mmol/L — ABNORMAL LOW (ref 134–144)
Total Protein: 7.1 g/dL (ref 6.0–8.5)
eGFR: 52 mL/min/{1.73_m2} — ABNORMAL LOW (ref >59)

## 2021-09-20 MED ORDER — LOSARTAN POTASSIUM 50 MG PO TABS: 50.0000 mg | ORAL_TABLET | Freq: Two times a day (BID) | ORAL | 0 refills | Status: DC

## 2021-09-25 ENCOUNTER — Telehealth: Payer: Self-pay | Admitting: Family Medicine

## 2021-09-25 ENCOUNTER — Other Ambulatory Visit: Payer: Self-pay | Admitting: Family Medicine

## 2021-09-25 DIAGNOSIS — I10 Essential (primary) hypertension: Secondary | ICD-10-CM

## 2021-09-25 NOTE — Telephone Encounter (Signed)
Duplicate request. See other encounter on 09/25/21.

## 2021-09-25 NOTE — Telephone Encounter (Unsigned)
Copied from [place] [id number]. Topic: Quick Communication - Rx Refill/Question >> Sep 25, 2021  9:24 AM Yvette Rack wrote: Medication: losartan (COZAAR) 50 MG tablet  Has the patient contacted their pharmacy? Yes.  Pt told that pharmacy has not received Rx (Agent: If no, request that the patient contact the pharmacy for the refill. If patient does not wish to contact the pharmacy document the reason why and proceed with request.) (Agent: If yes, when and what did the pharmacy advise?)  Preferred Pharmacy (with phone number or street name): [id number] [place] [address]  Phone: [phone] Fax: [phone]  Has the patient been seen for an appointment in the last year OR does the patient have an upcoming appointment? Yes.    Agent: Please be advised that RX refills may take up to 3 business days. We ask that you follow-up with your pharmacy.

## 2021-09-25 NOTE — Telephone Encounter (Signed)
Copied from [place] [phone]. Topic: Quick Communication - Rx Refill/Question >> Sep 25, 2021  9:24 AM Yvette Rack wrote: Medication: losartan (COZAAR) 50 MG tablet  Has the patient contacted their pharmacy? Yes.  Pt told that pharmacy has not received Rx (Agent: If no, request that the patient contact the pharmacy for the refill. If patient does not wish to contact the pharmacy document the reason why and proceed with request.) (Agent: If yes, when and what did the pharmacy advise?)  Preferred Pharmacy (with phone number or street name): [id number] [place] [address]  Phone: [phone] Fax: [phone]  Has the patient been seen for an appointment in the last year OR does the patient have an upcoming appointment? Yes.    Agent: Please be advised that RX refills may take up to 3 business days. We ask that you follow-up with your pharmacy.

## 2021-09-26 ENCOUNTER — Other Ambulatory Visit: Payer: Self-pay | Admitting: Family Medicine

## 2021-09-26 DIAGNOSIS — I10 Essential (primary) hypertension: Secondary | ICD-10-CM

## 2021-09-26 MED ORDER — LOSARTAN POTASSIUM 50 MG PO TABS: 50.0000 mg | ORAL_TABLET | Freq: Two times a day (BID) | ORAL | 0 refills | Status: DC

## 2021-09-26 NOTE — Telephone Encounter (Signed)
Requested Prescriptions  Pending Prescriptions Disp Refills   losartan (COZAAR) 50 MG tablet 90 tablet 0    Sig: Take 1 tablet (50 mg total) by mouth in the morning and at bedtime.     Cardiovascular:  Angiotensin Receptor Blockers Failed - 09/26/2021  3:35 PM      Failed - Cr in normal range and within 180 days    Creatinine  Date Value Ref Range Status  02/12/2012 0.87 0.60 - 1.30 mg/dL Final   Creatinine, Ser  Date Value Ref Range Status  09/19/2021 1.06 (H) 0.57 - 1.00 mg/dL Final         Failed - Last BP in normal range    BP Readings from Last 1 Encounters:  09/19/21 (!) 149/69         Failed - Valid encounter within last 6 months    Recent Outpatient Visits          1 week ago Primary hypertension   [facility] Mikey Kirschner, PA-C   6 months ago Essential hypertension   [facility] Jerrol Banana., MD   9 months ago Annual physical exam   [facility] Jerrol Banana., MD   1 year ago Essential hypertension   [facility] Jerrol Banana., MD   1 year ago Po[facility] dermatitis   [facility] [place], [place]arnce Sor[place], [place]      Future Appointments            In 2 months Jerrol Banana., MD [facility]           Passed - K in normal range and within 180 days    Potassium  Date Value Ref Range Status  09/19/2021 4.2 3.5 - 5.2 mmol/L Final  02/12/2012 4.8 3.5 - 5.1 mmol/L Final         Passed - Patient is not pregnant

## 2021-09-26 NOTE — Telephone Encounter (Signed)
Pt stated she is still unable to pick up losartan (COZAAR) 50 MG tablet, please advise.

## 2021-09-26 NOTE — Telephone Encounter (Signed)
Copied from [place] [phone]. Topic: Quick Communication - Rx Refill/Question >> Sep 26, 2021 11:34 AM Tessa Lerner A wrote: Medication: losartan (COZAAR) 50 MG tablet [[id number]]  Has the patient contacted their pharmacy? Yes.  The patient has been directed to contact their PCP (Agent: If no, request that the patient contact the pharmacy for the refill. If patient does not wish to contact the pharmacy document the reason why and proceed with request.) (Agent: If yes, when and what did the pharmacy advise?)  Preferred Pharmacy (with phone number or street name): [id number] [place] [address]  Phone:  [phone] Fax:  [phone]    Has the patient been seen for an appointment in the last year OR does the patient have an upcoming appointment? Yes.    Agent: Please be advised that RX refills may take up to 3 business days. We ask that you follow-up with your pharmacy.

## 2021-09-27 ENCOUNTER — Other Ambulatory Visit: Payer: Self-pay | Admitting: Family Medicine

## 2021-09-27 DIAGNOSIS — E7800 Pure hypercholesterolemia, unspecified: Secondary | ICD-10-CM

## 2021-09-27 DIAGNOSIS — I6529 Occlusion and stenosis of unspecified carotid artery: Secondary | ICD-10-CM

## 2021-10-02 ENCOUNTER — Telehealth: Payer: Self-pay

## 2021-10-02 NOTE — Telephone Encounter (Signed)
Copied from [place] [phone]. Topic: General - Inquiry >> Oct 02, 2021 12:09 PM Greggory Keen D wrote: Reason for CRM: Pt would like to pick up a copy of her last labs done in dec.  CB#  [phone]

## 2021-10-04 DIAGNOSIS — E871 Hypo-osmolality and hyponatremia: Secondary | ICD-10-CM | POA: Diagnosis not present

## 2021-10-05 LAB — BASIC METABOLIC PANEL
BUN/Creatinine Ratio: 12 (ref 12–28)
BUN: 12 mg/dL (ref 8–27)
CO2: 25 mmol/L (ref 20–29)
Calcium: 9.8 mg/dL (ref 8.7–10.3)
Chloride: 100 mmol/L (ref 96–106)
Creatinine, Ser: 0.97 mg/dL (ref 0.57–1.00)
Glucose: 109 mg/dL — ABNORMAL HIGH (ref 70–99)
Potassium: 4.2 mmol/L (ref 3.5–5.2)
Sodium: 138 mmol/L (ref 134–144)
eGFR: 58 mL/min/{1.73_m2} — ABNORMAL LOW (ref >59)

## 2021-10-08 NOTE — Progress Notes (Signed)
Established patient visit   Patient: Morgan Ponce   DOB: 10-29-1940   81 y.o. Female  MRN: [id number] Visit Date: 10/09/2021  Today's healthcare provider: Mikey Kirschner, PA-C   Cc. HTN f/u, muscle aches  Subjective    HPI  -Patient has questions pertaining to hypertensive kidney disease and if she is anemic because its constantly being mentioned -concerned that she is having reaction to rosuvastatin after reading pamphlet from pharmacy. States her legs are sore at times like she has been exercising. She has also noticed foam in her urine -Patient states swelling in her feet at times and noticed it started after she stopped other BP medication and started new one  Denies dizziness, headaches, blurred vision, chest pain. Hypertension, follow-up  BP Readings from Last 3 Encounters:  10/09/21 (!) 153/88  09/19/21 (!) 149/69  03/20/21 (!) 155/75   Wt Readings from Last 3 Encounters:  10/09/21 104 lb 3.2 oz (47.3 kg)  09/19/21 99 lb 3.2 oz (45 kg)  03/20/21 103 lb (46.7 kg)     She was last seen for hypertension 20 days ago. (09/19/21) BP at that visit was 149/69. Management since that visit includes begin BP checks at home and to take losartan 50 MG by mouth in the morning and at bedtime.  She reports excellent compliance with treatment. She is not having side effects She is following a Low Sodium diet. She is not exercising. She does not smoke.  Use of agents associated with hypertension: none.   Outside blood pressures are 153/88 79 Pulse this morning at 7:50 am, has been taking daily and averages are 145/80, with highs of 159/88 Symptoms: No chest pain No chest pressure  Yes palpitations No syncope  No dyspnea No orthopnea  No paroxysmal nocturnal dyspnea Yes lower extremity edema   Pertinent labs: Lab Results  Component Value Date   CHOL 150 03/20/2021   HDL 69 03/20/2021   LDLCALC 68 03/20/2021   TRIG 65 03/20/2021   CHOLHDL 2.2 03/20/2021   Lab  Results  Component Value Date   NA 138 10/04/2021   K 4.2 10/04/2021   CREATININE 0.97 10/04/2021   EGFR 58 (L) 10/04/2021   GLUCOSE 109 (H) 10/04/2021   TSH 3.160 03/20/2021     The ASCVD Risk score (Arnett DK, et al., 2019) failed to calculate for the following reasons:   The 2019 ASCVD risk score is only valid for ages 53 to 65   ---------------------------------------------------------------------------------------------------   Medications: Outpatient Medications Prior to Visit  Medication Sig   aspirin 81 MG tablet Take 81 mg at bedtime by mouth.    Blood Pressure Monitoring (BLOOD PRESSURE KIT) DEVI Check BP twice a day in morning and evening   Polyvinyl Alcohol-Povidone (REFRESH OP) Place 1 drop 5 (five) times daily as needed into both eyes (DRY EYES).   Vitamin D, Cholecalciferol, 1000 UNITS TABS Take 1 tablet by mouth daily.   [DISCONTINUED] losartan (COZAAR) 50 MG tablet Take 1 tablet (50 mg total) by mouth in the morning and at bedtime.   [DISCONTINUED] rosuvastatin (CRESTOR) 20 MG tablet TAKE 1 TABLET BY MOUTH EVERY DAY   No facility-administered medications prior to visit.    Review of Systems  Constitutional:  Negative for fatigue.  Respiratory:  Negative for shortness of breath.   Cardiovascular:  Positive for leg swelling. Negative for chest pain and palpitations.  Neurological:  Negative for dizziness and headaches.     Objective    Blood  pressure (!) 153/88, pulse 79, temperature 97.8 F (36.6 C), temperature source Oral, height 5\' 3"  (1.6 m), weight 104 lb 3.2 oz (47.3 kg), SpO2 99 %.  Home readings consistently lower.   Physical Exam Constitutional:      General: She is awake.     Appearance: She is well-developed.  HENT:     Head: Normocephalic.  Eyes:     Conjunctiva/sclera: Conjunctivae normal.  Cardiovascular:     Rate and Rhythm: Normal rate and regular rhythm.     Pulses:          Dorsalis pedis pulses are 2+ on the right side and 2+ on  the left side.     Heart sounds: Normal heart sounds.  Pulmonary:     Effort: Pulmonary effort is normal.     Breath sounds: Normal breath sounds.  Feet:     Comments: Right foot 1+ dependent edema, left foot no edema.  Skin:    General: Skin is warm.  Neurological:     Mental Status: She is alert and oriented to person, place, and time.  Psychiatric:        Attention and Perception: Attention normal.        Mood and Affect: Mood normal.        Speech: Speech normal.        Behavior: Behavior is cooperative.      No results found for any visits on 10/09/21.  Assessment & Plan     Problem List Items Addressed This Visit       Cardiovascular and Mediastinum   Hypertension    Currently takes losartan 50 mg BID. We d/c hctz due to hyponatremia. Hyponatremia is improved, as well as kidney fx d/t fluid increase. Continue increased fluids, but can take less Gatorade.   BP consistently high in office, home cuff checked and accurate -- home readings much lower with highs 150s/high 80s and averages 145/80s.  For now, continue with checking at home, advised to mark when in the 150s to see if any increased anxiety at these times.  Continue losartan Will recheck in 1 mo  Noticing increase in swelling in feet since stopping hctz and increasing fluids, R>L but hx of R osteoarthritis, will monitor      Relevant Medications   rosuvastatin (CRESTOR) 10 MG tablet   losartan (COZAAR) 50 MG tablet   Other Relevant Orders   Urine Microalbumin w/creat. ratio     Other   Anemia    Stable. Unsure etiology will continue to monitor. No iron panel seen, may consider w/u in future. Dr 12/07/21 was monitoring. Discussed increasing iron in diet, pt does not eat red meat but eats many leafy greens.      Hypercholesteremia - Primary    Myalgias and ?foamy urine from statin Will start cutting in half and take 10 mg  Sent in 10 mg rx will continue to monitor SE, eventually if does not tolerate, can  d/c   Check urine microalbumin  F.u 1 mo       Relevant Medications   rosuvastatin (CRESTOR) 10 MG tablet   losartan (COZAAR) 50 MG tablet   Other Relevant Orders   Urine Microalbumin w/creat. ratio     Return in about 6 weeks (around 11/19/2021) for hyperlipidemia, hypertension.      I, 11/21/2021, PA-C have reviewed all documentation for this visit. The documentation on  10/09/2021 for the exam, diagnosis, procedures, and orders are all accurate and complete.  Mikey Kirschner, PA-C  [facility] [phone] (phone) [phone] (fax)  [place]

## 2021-10-09 ENCOUNTER — Ambulatory Visit (INDEPENDENT_AMBULATORY_CARE_PROVIDER_SITE_OTHER): Payer: Medicare HMO | Admitting: Physician Assistant

## 2021-10-09 ENCOUNTER — Encounter: Payer: Self-pay | Admitting: Physician Assistant

## 2021-10-09 ENCOUNTER — Other Ambulatory Visit: Payer: Self-pay

## 2021-10-09 VITALS — BP 153/88 | HR 79 | Temp 97.8°F | Ht 63.0 in | Wt 104.2 lb

## 2021-10-09 DIAGNOSIS — D649 Anemia, unspecified: Secondary | ICD-10-CM | POA: Diagnosis not present

## 2021-10-09 DIAGNOSIS — E7800 Pure hypercholesterolemia, unspecified: Secondary | ICD-10-CM

## 2021-10-09 DIAGNOSIS — I10 Essential (primary) hypertension: Secondary | ICD-10-CM

## 2021-10-09 MED ORDER — LOSARTAN POTASSIUM 50 MG PO TABS: 50.0000 mg | ORAL_TABLET | Freq: Two times a day (BID) | ORAL | 1 refills | Status: DC

## 2021-10-09 MED ORDER — ROSUVASTATIN CALCIUM 10 MG PO TABS: 10.0000 mg | ORAL_TABLET | Freq: Every day | ORAL | 0 refills | Status: DC

## 2021-10-09 NOTE — Assessment & Plan Note (Addendum)
Currently takes losartan 50 mg BID. We d/c hctz due to hyponatremia. Hyponatremia is improved, as well as kidney fx d/t fluid increase. Continue increased fluids, but can take less Gatorade.   BP consistently high in office, home cuff checked and accurate -- home readings much lower with highs 150s/high 80s and averages 145/80s.  For now, continue with checking at home, advised to mark when in the 150s to see if any increased anxiety at these times.  Continue losartan Will recheck in 1 mo  Noticing increase in swelling in feet since stopping hctz and increasing fluids, R>L but hx of R osteoarthritis, will monitor

## 2021-10-09 NOTE — Assessment & Plan Note (Addendum)
Myalgias and ?foamy urine from statin Will start cutting in half and take 10 mg  Sent in 10 mg rx will continue to monitor SE, eventually if does not tolerate, can d/c   Check urine microalbumin  F.u 1 mo

## 2021-10-09 NOTE — Assessment & Plan Note (Signed)
Stable. Unsure etiology will continue to monitor. No iron panel seen, may consider w/u in future. Dr Rosanna Randy was monitoring. Discussed increasing iron in diet, pt does not eat red meat but eats many leafy greens.

## 2021-10-10 LAB — SPECIMEN STATUS REPORT

## 2021-10-10 LAB — MICROALBUMIN / CREATININE URINE RATIO
Creatinine, Urine: 38.4 mg/dL
Microalb/Creat Ratio: 56 mg/g creat — ABNORMAL HIGH (ref 0–29)
Microalbumin, Urine: 21.6 ug/mL

## 2021-11-05 ENCOUNTER — Other Ambulatory Visit: Payer: Self-pay

## 2021-11-05 ENCOUNTER — Ambulatory Visit
Admission: RE | Admit: 2021-11-05 | Discharge: 2021-11-05 | Disposition: A | Discharge status: Home / Self Care | Payer: Medicare HMO | Source: Ambulatory Visit | Attending: Physician Assistant | Admitting: Physician Assistant

## 2021-11-05 DIAGNOSIS — Z13820 Encounter for screening for osteoporosis: Secondary | ICD-10-CM | POA: Diagnosis not present

## 2021-11-05 DIAGNOSIS — M8580 Other specified disorders of bone density and structure, unspecified site: Secondary | ICD-10-CM | POA: Insufficient documentation

## 2021-11-05 DIAGNOSIS — M810 Age-related osteoporosis without current pathological fracture: Secondary | ICD-10-CM | POA: Diagnosis not present

## 2021-11-05 DIAGNOSIS — Z780 Asymptomatic menopausal state: Secondary | ICD-10-CM | POA: Diagnosis not present

## 2021-11-07 ENCOUNTER — Other Ambulatory Visit: Payer: Self-pay | Admitting: Family Medicine

## 2021-11-07 DIAGNOSIS — Z1231 Encounter for screening mammogram for malignant neoplasm of breast: Secondary | ICD-10-CM

## 2021-11-20 ENCOUNTER — Encounter: Payer: Self-pay | Admitting: Physician Assistant

## 2021-11-20 ENCOUNTER — Other Ambulatory Visit: Payer: Self-pay

## 2021-11-20 ENCOUNTER — Ambulatory Visit (INDEPENDENT_AMBULATORY_CARE_PROVIDER_SITE_OTHER): Payer: Medicare HMO | Admitting: Physician Assistant

## 2021-11-20 VITALS — BP 143/86 | HR 96 | Resp 15 | Wt 102.2 lb

## 2021-11-20 DIAGNOSIS — I10 Essential (primary) hypertension: Secondary | ICD-10-CM

## 2021-11-20 DIAGNOSIS — M818 Other osteoporosis without current pathological fracture: Secondary | ICD-10-CM | POA: Diagnosis not present

## 2021-11-20 DIAGNOSIS — R002 Palpitations: Secondary | ICD-10-CM | POA: Diagnosis not present

## 2021-11-20 DIAGNOSIS — E7800 Pure hypercholesterolemia, unspecified: Secondary | ICD-10-CM

## 2021-11-20 NOTE — Assessment & Plan Note (Signed)
Elevated in office but values at home are in range.  Advised she continue to occasionally check BP at home to monitor.

## 2021-11-20 NOTE — Progress Notes (Signed)
Established patient visit   Patient: Morgan Ponce   DOB: 12/12/1940   81 y.o. Female  MRN: [id number] Visit Date: 11/20/2021  Today's healthcare provider: Mikey Kirschner, PA-C   Chief Complaint  Patient presents with   Hyperlipidemia   Hypertension   Subjective    HPI  Lipid/Cholesterol, Follow-up  Last lipid panel Other pertinent labs  Lab Results  Component Value Date   CHOL 150 03/20/2021   HDL 69 03/20/2021   LDLCALC 68 03/20/2021   TRIG 65 03/20/2021   CHOLHDL 2.2 03/20/2021   Lab Results  Component Value Date   ALT 11 09/19/2021   AST 32 09/19/2021   PLT 196 09/19/2021   TSH 3.160 03/20/2021     She was last seen for this 1 months ago.  Management since that visit includes decreased Crestor to 61m qd.  She reports good compliance with treatment. She is having side effects. Patient states that she has noticed that medication is causing a soreness feeling in her lower muscles. No change felt decreasing the dose.   Symptoms: No chest pain No chest pressure/discomfort  No dyspnea No lower extremity edema  No numbness or tingling of extremity Yes orthopnea  No palpitations No paroxysmal nocturnal dyspnea  No speech difficulty No syncope   Current diet: well balanced Current exercise: no regular exercise  The ASCVD Risk score (Arnett DK, et al., 2019) failed to calculate for the following reasons:   The 2019 ASCVD risk score is only valid for ages 494to 755 ---------------------------------------------------------------------------------------------------  Hypertension, follow-up  BP Readings from Last 3 Encounters:  11/20/21 (!) 143/86  10/09/21 (!) 153/88  09/19/21 (!) 149/69   Wt Readings from Last 3 Encounters:  11/20/21 102 lb 3.2 oz (46.4 kg)  10/09/21 104 lb 3.2 oz (47.3 kg)  09/19/21 99 lb 3.2 oz (45 kg)     She was last seen for hypertension 1 months ago.  BP at that visit was 153/88. Management since that visit includes continue  increased fluids, continue Losartan. Patient states that Losartan was increased to BID  She reports good compliance with treatment. She is not having side effects.  She is following a Regular diet. She is not exercising. She does not smoke.  Use of agents associated with hypertension: NSAIDS.   Outside blood pressures are systolic ranging 1932-355diastolic ranging 773-22Two isolated blood pressure values in the 150s.  She notes that her machine will often flag a 'irregular heartbeat'. She describes very sporadic heart 'racing' symptoms, where she has a few fast beats, and then it will skip a beat. Typically only when she is in a medical setting or is nervous/stressed. Denies any associated dizziness, shortness of breath, chest pain, dizziness, changes in vision.  Symptoms: No chest pain No chest pressure  No palpitations No syncope  No dyspnea Yes orthopnea  No paroxysmal nocturnal dyspnea No lower extremity edema   Pertinent labs: Lab Results  Component Value Date   CHOL 150 03/20/2021   HDL 69 03/20/2021   LDLCALC 68 03/20/2021   TRIG 65 03/20/2021   CHOLHDL 2.2 03/20/2021   Lab Results  Component Value Date   NA 138 10/04/2021   K 4.2 10/04/2021   CREATININE 0.97 10/04/2021   EGFR 58 (L) 10/04/2021   GLUCOSE 109 (H) 10/04/2021   TSH 3.160 03/20/2021     The ASCVD Risk score (Arnett DK, et al., 2019) failed to calculate for the following reasons:   The 2019  ASCVD risk score is only valid for ages 27 to 34   ---------------------------------------------------------------------------------------------------   Medications: Outpatient Medications Prior to Visit  Medication Sig   aspirin 81 MG tablet Take 81 mg at bedtime by mouth.    Blood Pressure Monitoring (BLOOD PRESSURE KIT) DEVI Check BP twice a day in morning and evening   losartan (COZAAR) 50 MG tablet Take 1 tablet (50 mg total) by mouth in the morning and at bedtime.   Polyvinyl Alcohol-Povidone (REFRESH OP)  Place 1 drop 5 (five) times daily as needed into both eyes (DRY EYES).   rosuvastatin (CRESTOR) 10 MG tablet Take 1 tablet (10 mg total) by mouth daily.   Vitamin D, Cholecalciferol, 1000 UNITS TABS Take 1 tablet by mouth daily.   No facility-administered medications prior to visit.    Review of Systems  Constitutional:  Negative for fatigue and fever.  Respiratory:  Negative for cough and shortness of breath.   Cardiovascular:  Positive for palpitations. Negative for chest pain and leg swelling.  Gastrointestinal:  Negative for abdominal pain.  Musculoskeletal:  Positive for myalgias.  Neurological:  Negative for dizziness and headaches.     Objective    BP (!) 143/86 Comment: home value   Pulse 96    Resp 15    Wt 102 lb 3.2 oz (46.4 kg)    SpO2 98%    BMI 18.10 kg/m    Physical Exam Constitutional:      General: She is awake.     Appearance: She is well-developed.  HENT:     Head: Normocephalic.  Eyes:     Conjunctiva/sclera: Conjunctivae normal.  Cardiovascular:     Rate and Rhythm: Normal rate and regular rhythm.     Heart sounds: Normal heart sounds.  Pulmonary:     Effort: Pulmonary effort is normal.     Breath sounds: Normal breath sounds.  Musculoskeletal:     Right lower leg: No edema.     Left lower leg: No edema.  Skin:    General: Skin is warm.  Neurological:     Mental Status: She is alert and oriented to person, place, and time.  Psychiatric:        Attention and Perception: Attention normal.        Mood and Affect: Mood normal.        Speech: Speech normal.        Behavior: Behavior is cooperative.    No results found for any visits on 11/20/21.  Assessment & Plan     Problem List Items Addressed This Visit       Cardiovascular and Mediastinum   Hypertension - Primary    Elevated in office but values at home are in range.  Advised she continue to occasionally check BP at home to monitor.         Musculoskeletal and Integument   OP  (osteoporosis)    Discussed recent dexa results Option to potentially restart bisphosphonate or ref to endo and try prolia. Pt hesitant to both choices. She prefers to discuss w/ Dr Rosanna Randy at their next appointment. Information give on prolia.         Other   Hypercholesteremia    Discussed switching to pravastatin or d/c crestor. Pt is hesitant to make changes, wants to discuss w. Dr Rosanna Randy.  Sticking with Crestor 10 mg for now, will continue to monitor myalgias. To note, pt admits to walking more frequently than usual.       Palpitations  On chart, history of 'flutter-fibrillation' but no cardiology notes, unable to find visit note addressing in last 6 years. Currently asymptomatic and in times of stress. Advised pt to monitor, if increasing or associated with other symptoms, we can refer to cardiology.        Return as scheduled.      I, Mikey Kirschner, PA-C have reviewed all documentation for this visit. The documentation on  11/20/2021  for the exam, diagnosis, procedures, and orders are all accurate and complete.  I,Enaya Howze,acting as a Education administrator for [employer], PA-C.,have documented all relevant documentation on the behalf of Mikey Kirschner, PA-C,as directed by  Mikey Kirschner, PA-C while in the presence of Mikey Kirschner, PA-C.   Mikey Kirschner, PA-C  [facility] [phone] (phone) [phone] (fax)  [place]

## 2021-11-20 NOTE — Assessment & Plan Note (Signed)
On chart, history of 'flutter-fibrillation' but no cardiology notes, unable to find visit note addressing in last 6 years. Currently asymptomatic and in times of stress. Advised pt to monitor, if increasing or associated with other symptoms, we can refer to cardiology.

## 2021-11-20 NOTE — Assessment & Plan Note (Signed)
Discussed switching to pravastatin or d/c crestor. Pt is hesitant to make changes, wants to discuss w. Dr Rosanna Randy.  Sticking with Crestor 10 mg for now, will continue to monitor myalgias. To note, pt admits to walking more frequently than usual.

## 2021-11-20 NOTE — Assessment & Plan Note (Signed)
Discussed recent dexa results Option to potentially restart bisphosphonate or ref to endo and try prolia. Pt hesitant to both choices. She prefers to discuss w/ Dr Rosanna Randy at their next appointment. Information give on prolia.

## 2021-12-10 ENCOUNTER — Telehealth: Payer: Self-pay | Admitting: Family Medicine

## 2021-12-21 NOTE — Progress Notes (Signed)
? ? ? ?Annual Wellness Visit ? ?  ?I,Morgan Ponce,acting as a scribe for Morgan Durie, MD.,have documented all relevant documentation on the behalf of Morgan Durie, MD,as directed by  Morgan Durie, MD while in the presence of Morgan Durie, MD. ? ? ?Patient: Morgan Ponce, Female    DOB: 07-07-1941, 81 y.o.   MRN: [id number] ?Visit Date: 12/24/2021 ? ?Today's Provider: [name], MD  ? ?Chief Complaint  ?Patient presents with  ? Medicare Wellness  ? ?Subjective  ?  ?Morgan Ponce is a 81 y.o. female who presents today for her Annual Wellness Visit. ?She reports consuming a general Ponce low sodium diet. Home exercise routine includes does a small amount of walking. She generally feels well. She reports sleeping well. She does not have additional problems to discuss today.  ?She is widowed Ponce lives alone.  She is active Ponce has had no falls.  She started Morgan Ponce Morgan of last year but since then her  legs feel sore Ponce cause her discomfort. ?She does have osteoporosis but does not want a parenteral treatment. ?HPI ? ?Medications: ?Outpatient Medications Prior to Visit  ?Medication Sig  ? aspirin 81 MG tablet Take 81 mg at bedtime by mouth.   ? Blood Pressure Monitoring (BLOOD PRESSURE KIT) DEVI Check BP twice a day in morning Ponce evening  ? losartan (COZAAR) 50 MG tablet Take 1 tablet (50 mg total) by mouth in the morning Ponce at bedtime.  ? Polyvinyl Alcohol-Povidone (REFRESH OP) Place 1 drop 5 (five) times daily as needed into both eyes (DRY EYES).  ? rosuvastatin (Morgan Ponce) 10 MG tablet Take 1 tablet (10 mg total) by mouth daily.  ? Vitamin D, Cholecalciferol, 1000 UNITS TABS Take 1 tablet by mouth daily.  ? ?No facility-administered medications prior to visit.  ?  ?Allergies  ?Allergen Reactions  ? Cardizem [Diltiazem] Rash  ? Diltiazem Hcl Rash  ? ? ?Patient Care Team: ?Morgan Ponce., MD as PCP - General (Family Medicine) ?Morgan Robson, MD as Referring Physician  (Ophthalmology) ?Morgan, Rayvon Char, MD (Dermatology) ?Throat, Morgan Ponce ? ?Review of Systems  ?All other systems reviewed Ponce are negative. ? ?Last lipids ?Lab Results  ?Component Value Date  ? CHOL 150 03/20/2021  ? HDL 69 03/20/2021  ? [place] 68 03/20/2021  ? TRIG 65 03/20/2021  ? CHOLHDL 2.2 03/20/2021  ? ?  ?  ? Objective  ?  ?Vitals: BP (!) 175/80 (BP Location: Right Arm, Patient Position: Sitting, Cuff Size: Normal)   Pulse 75   Temp 98.1 ?F (36.7 ?C) (Temporal)   Resp 16   Ht _0  (1.6 m)   Wt 100 lb (45.4 kg)   SpO2 98%   BMI 17.71 kg/m?  ?BP Readings from Last 3 Encounters:  ?12/24/21 (!) 175/80  ?11/20/21 (!) 143/86  ?10/09/21 (!) 153/88  ? ?Wt Readings from Last 3 Encounters:  ?12/24/21 100 lb (45.4 kg)  ?11/20/21 102 lb 3.2 oz (46.4 kg)  ?10/09/21 104 lb 3.2 oz (47.3 kg)  ? ?  ? ? ?Physical Exam ?Vitals reviewed.  ?Constitutional:   ?   Appearance: She is well-developed.  ?HENT:  ?   Head: Normocephalic Ponce atraumatic.  ?   Comments: Bilateral hearing aides. ?   Right Ear: External ear normal.  ?   Left Ear: External ear normal.  ?   Nose: Nose normal.  ?Eyes:  ?   General: No scleral icterus. ?  Conjunctiva/sclera: Conjunctivae normal.  ?Neck:  ?   Thyroid: No thyromegaly.  ?   Vascular: No carotid bruit.  ?Cardiovascular:  ?   Rate Ponce Rhythm: Normal rate Ponce regular rhythm.  ?   Heart sounds: Normal heart sounds.  ?Pulmonary:  ?   Effort: Pulmonary effort is normal.  ?   Breath sounds: Normal breath sounds.  ?Chest:  ?Breasts: ?   Right: Normal.  ?   Left: Normal.  ?Abdominal:  ?   Palpations: Abdomen is soft.  ?Musculoskeletal:     ?   General: No tenderness.  ?   Right lower leg: No edema.  ?   Left lower leg: No edema.  ?Lymphadenopathy:  ?   Cervical: No cervical adenopathy.  ?Skin: ?   General: Skin is warm Ponce dry.  ?   Comments: Very fair skin.  ?Neurological:  ?   General: No focal deficit present.  ?   Mental Status: She is alert Ponce oriented to person, place, Ponce time.   ?Psychiatric:     ?   Mood Ponce Affect: Mood normal.     ?   Behavior: Behavior normal.     ?   Thought Content: Thought content normal.     ?   Judgment: Judgment normal.  ? ? ? ?Most recent functional status assessment: ? ?  12/24/2021  ?  1:52 PM  ?In your present state of health, do you have any difficulty performing the following activities:  ?Hearing? 1  ?Vision? 1  ?Difficulty concentrating or making decisions? 0  ?Walking or climbing stairs? 1  ?Dressing or bathing? 0  ?Doing errands, shopping? 0  ? ?Most recent fall risk assessment: ? ?  12/24/2021  ?  1:52 PM  ?Fall Risk   ?Falls in the past year? 0  ?Number falls in past yr: 0  ?Injury with Fall? 0  ?Risk for fall due to : No Fall Risks  ?Follow up Falls evaluation completed  ? ? Most recent depression screenings: ? ?  12/24/2021  ?  1:52 PM 09/19/2021  ? 10:39 AM  ?PHQ 2/9 Scores  ?PHQ - 2 Score 0 0  ?PHQ- 9 Score 0 0  ? ?Most recent cognitive screening: ? ?  11/16/2019  ?  9:59 AM  ?6CIT Screen  ?What Year? 0 points  ?What month? 0 points  ?What time? 0 points  ?Count back from 20 0 points  ?Months in reverse 0 points  ?Repeat phrase 0 points  ?Total Score 0 points  ? ?Most recent Audit-C alcohol use screening ? ?  12/24/2021  ?  1:52 PM  ?Alcohol Use Disorder Test (AUDIT)  ?1. How often do you have a drink containing alcohol? 0  ?2. How many drinks containing alcohol do you have on a typical day when you are drinking? 0  ? ?A score of 3 or more in women, Ponce 4 or more in men indicates increased risk for alcohol abuse, EXCEPT if all of the points are from question 1  ? ?No results found for any visits on 12/24/21. ? Assessment & Plan  ?  ? ?Annual wellness visit done today including the all of the following: ?Reviewed patient's Family Medical History ?Reviewed Ponce updated list of patient's medical providers ?Assessment of cognitive impairment was done ?Assessed patient's functional ability ?Established a written schedule for health screening services ?Health  Risk Assessent Completed Ponce Reviewed ? ?Exercise Activities Ponce Dietary recommendations ? Goals   ? ?  DIET -  INCREASE WATER INTAKE   ?  Recommend increasing water intake to 6-8 8 oz glasses a day.  ?  ? ?  ? ? ?Immunization History  ?Administered Date(s) Administered  ? Fluad Quad(high Dose 65+) 06/23/2019, 06/15/2020, 06/20/2021  ? Influenza, High Dose Seasonal PF 06/10/2015, 06/14/2016, 07/01/2017, 07/02/2018  ? PFIZER(Purple Top)SARS-COV-2 Vaccination 10/06/2019, 10/27/2019, 07/05/2020  ? Pneumococcal Conjugate-13 01/16/2015  ? Pneumococcal Polysaccharide-23 08/27/2004  ? Tdap 05/27/2011  ? Zoster, Live 07/06/2013  ? ? ?Health Maintenance  ?Topic Date Due  ? Zoster Vaccines- Shingrix (1 of 2) Never done  ? COVID-19 Vaccine (4 - Booster for [place] series) 08/30/2020  ? TETANUS/TDAP  05/26/2021  ? DEXA SCAN  11/05/2026  ? Pneumonia Vaccine 81+ Years old  Completed  ? INFLUENZA VACCINE  Completed  ? HPV VACCINES  Aged Out  ? ? ? ?Discussed health benefits of physical activity, Ponce encouraged her to engage in regular exercise appropriate for her age Ponce condition.  ?  ?1. Encounter for Medicare annual wellness exam ? ?- Lipid panel ?- TSH ?- CBC w/Diff/Platelet ?- Comprehensive Metabolic Panel (CMET) ? ?2. Annual physical exam ? ? ?3. Essential hypertension ?Home blood pressures read 120-130/75 ?- Lipid panel ?- TSH ?- CBC w/Diff/Platelet ?- Comprehensive Metabolic Panel (CMET) ? ?4. Hypercholesteremia ?Consider cutting rosuvastatin in half if CK is elevated but with carotid stenosis I think would be a good idea to stay on this. ?- Lipid panel ?- TSH ?- CBC w/Diff/Platelet ?- Comprehensive Metabolic Panel (CMET) ? ?5. Hyponatremia ? ?- Lipid panel ?- TSH ?- CBC w/Diff/Platelet ?- Comprehensive Metabolic Panel (CMET) ? ?6. Hypertensive kidney disease ?Avoid NSAIDs ?- Lipid panel ?- TSH ?- CBC w/Diff/Platelet ?- Comprehensive Metabolic Panel (CMET) ? ?7. Stenosis of carotid artery, unspecified laterality ?Treat  aggressively all risk factors ?- Lipid panel ?- TSH ?- CBC w/Diff/Platelet ?- Comprehensive Metabolic Panel (CMET) ? ?8. Osteopenia, unspecified location ? ?- Lipid panel ?- TSH ?- CBC w/Diff/Platelet ?- Comprehensive Me

## 2021-12-24 ENCOUNTER — Encounter: Payer: Self-pay | Admitting: Family Medicine

## 2021-12-24 ENCOUNTER — Ambulatory Visit
Admission: RE | Admit: 2021-12-24 | Discharge: 2021-12-24 | Disposition: A | Discharge status: Home / Self Care | Payer: Medicare HMO | Source: Ambulatory Visit | Attending: Family Medicine | Admitting: Family Medicine

## 2021-12-24 ENCOUNTER — Other Ambulatory Visit: Payer: Self-pay

## 2021-12-24 ENCOUNTER — Ambulatory Visit (INDEPENDENT_AMBULATORY_CARE_PROVIDER_SITE_OTHER): Payer: Medicare HMO | Admitting: Family Medicine

## 2021-12-24 VITALS — BP 130/75 | HR 75 | Temp 98.1°F | Resp 16 | Ht 63.0 in | Wt 100.0 lb

## 2021-12-24 DIAGNOSIS — I6529 Occlusion and stenosis of unspecified carotid artery: Secondary | ICD-10-CM | POA: Diagnosis not present

## 2021-12-24 DIAGNOSIS — M8580 Other specified disorders of bone density and structure, unspecified site: Secondary | ICD-10-CM | POA: Diagnosis not present

## 2021-12-24 DIAGNOSIS — M818 Other osteoporosis without current pathological fracture: Secondary | ICD-10-CM | POA: Diagnosis not present

## 2021-12-24 DIAGNOSIS — I129 Hypertensive chronic kidney disease with stage 1 through stage 4 chronic kidney disease, or unspecified chronic kidney disease: Secondary | ICD-10-CM

## 2021-12-24 DIAGNOSIS — I10 Essential (primary) hypertension: Secondary | ICD-10-CM

## 2021-12-24 DIAGNOSIS — Z1231 Encounter for screening mammogram for malignant neoplasm of breast: Secondary | ICD-10-CM | POA: Insufficient documentation

## 2021-12-24 DIAGNOSIS — M1990 Unspecified osteoarthritis, unspecified site: Secondary | ICD-10-CM | POA: Diagnosis not present

## 2021-12-24 DIAGNOSIS — E7800 Pure hypercholesterolemia, unspecified: Secondary | ICD-10-CM | POA: Diagnosis not present

## 2021-12-24 DIAGNOSIS — M791 Myalgia, unspecified site: Secondary | ICD-10-CM | POA: Diagnosis not present

## 2021-12-24 DIAGNOSIS — E871 Hypo-osmolality and hyponatremia: Secondary | ICD-10-CM

## 2021-12-24 DIAGNOSIS — Z0000 Encounter for general adult medical examination without abnormal findings: Secondary | ICD-10-CM

## 2021-12-24 MED ORDER — ALENDRONATE SODIUM 70 MG PO TABS: 70.0000 mg | ORAL_TABLET | ORAL | 11 refills | Status: DC

## 2021-12-25 ENCOUNTER — Other Ambulatory Visit: Payer: Self-pay | Admitting: Physician Assistant

## 2021-12-25 DIAGNOSIS — E871 Hypo-osmolality and hyponatremia: Secondary | ICD-10-CM | POA: Diagnosis not present

## 2021-12-25 DIAGNOSIS — I129 Hypertensive chronic kidney disease with stage 1 through stage 4 chronic kidney disease, or unspecified chronic kidney disease: Secondary | ICD-10-CM | POA: Diagnosis not present

## 2021-12-25 DIAGNOSIS — M8580 Other specified disorders of bone density and structure, unspecified site: Secondary | ICD-10-CM | POA: Diagnosis not present

## 2021-12-25 DIAGNOSIS — Z0000 Encounter for general adult medical examination without abnormal findings: Secondary | ICD-10-CM | POA: Diagnosis not present

## 2021-12-25 DIAGNOSIS — E7800 Pure hypercholesterolemia, unspecified: Secondary | ICD-10-CM | POA: Diagnosis not present

## 2021-12-25 DIAGNOSIS — M1990 Unspecified osteoarthritis, unspecified site: Secondary | ICD-10-CM | POA: Diagnosis not present

## 2021-12-25 DIAGNOSIS — I6529 Occlusion and stenosis of unspecified carotid artery: Secondary | ICD-10-CM | POA: Diagnosis not present

## 2021-12-25 DIAGNOSIS — M791 Myalgia, unspecified site: Secondary | ICD-10-CM | POA: Diagnosis not present

## 2021-12-25 DIAGNOSIS — I10 Essential (primary) hypertension: Secondary | ICD-10-CM | POA: Diagnosis not present

## 2021-12-26 LAB — LIPID PANEL
Chol/HDL Ratio: 2 ratio (ref 0.0–4.4)
Cholesterol, Total: 150 mg/dL (ref 100–199)
HDL: 75 mg/dL (ref >39)
LDL Chol Calc (NIH): 64 mg/dL (ref 0–99)
Triglycerides: 51 mg/dL (ref 0–149)
VLDL Cholesterol Cal: 11 mg/dL (ref 5–40)

## 2021-12-26 LAB — COMPREHENSIVE METABOLIC PANEL
ALT: 15 IU/L (ref 0–32)
AST: 31 IU/L (ref 0–40)
Albumin/Globulin Ratio: 1.6 (ref 1.2–2.2)
Albumin: 4.2 g/dL (ref 3.6–4.6)
Alkaline Phosphatase: 63 IU/L (ref 44–121)
BUN/Creatinine Ratio: 12 (ref 12–28)
BUN: 13 mg/dL (ref 8–27)
Bilirubin Total: 0.6 mg/dL (ref 0.0–1.2)
CO2: 25 mmol/L (ref 20–29)
Calcium: 9.6 mg/dL (ref 8.7–10.3)
Chloride: 95 mmol/L — ABNORMAL LOW (ref 96–106)
Creatinine, Ser: 1.05 mg/dL — ABNORMAL HIGH (ref 0.57–1.00)
Globulin, Total: 2.6 g/dL (ref 1.5–4.5)
Glucose: 100 mg/dL — ABNORMAL HIGH (ref 70–99)
Potassium: 4.6 mmol/L (ref 3.5–5.2)
Sodium: 137 mmol/L (ref 134–144)
Total Protein: 6.8 g/dL (ref 6.0–8.5)
eGFR: 53 mL/min/{1.73_m2} — ABNORMAL LOW (ref >59)

## 2021-12-26 LAB — CBC WITH DIFFERENTIAL/PLATELET
Basophils Absolute: 0 10*3/uL (ref 0.0–0.2)
Basos: 1 %
EOS (ABSOLUTE): 0.1 10*3/uL (ref 0.0–0.4)
Eos: 2 %
Hematocrit: 31.3 % — ABNORMAL LOW (ref 34.0–46.6)
Hemoglobin: 10.2 g/dL — ABNORMAL LOW (ref 11.1–15.9)
Immature Grans (Abs): 0 10*3/uL (ref 0.0–0.1)
Immature Granulocytes: 0 %
Lymphocytes Absolute: 0.9 10*3/uL (ref 0.7–3.1)
Lymphs: 25 %
MCH: 28.5 pg (ref 26.6–33.0)
MCHC: 32.6 g/dL (ref 31.5–35.7)
MCV: 87 fL (ref 79–97)
Monocytes Absolute: 0.4 10*3/uL (ref 0.1–0.9)
Monocytes: 9 %
Neutrophils Absolute: 2.4 10*3/uL (ref 1.4–7.0)
Neutrophils: 63 %
Platelets: 199 10*3/uL (ref 150–450)
RBC: 3.58 x10E6/uL — ABNORMAL LOW (ref 3.77–5.28)
RDW: 13.7 % (ref 11.7–15.4)
WBC: 3.8 10*3/uL (ref 3.4–10.8)

## 2021-12-26 LAB — CK: Total CK: 148 U/L (ref 26–161)

## 2021-12-26 LAB — TSH: TSH: 3.07 u[IU]/mL (ref 0.450–4.500)

## 2022-01-14 ENCOUNTER — Telehealth: Payer: Self-pay

## 2022-01-14 NOTE — Telephone Encounter (Signed)
Copied from [place] [id number]. Topic: General - Other ?>> Jan 11, 2022  9:08 AM Valere Dross wrote: ?Reason for CRM: Pt called in wanting to get her most recent lab results mailed to her address on file, please advise. ?

## 2022-01-14 NOTE — Telephone Encounter (Signed)
Results were mailed

## 2022-02-04 DIAGNOSIS — Z7982 Long term (current) use of aspirin: Secondary | ICD-10-CM | POA: Diagnosis not present

## 2022-02-04 DIAGNOSIS — E785 Hyperlipidemia, unspecified: Secondary | ICD-10-CM | POA: Diagnosis not present

## 2022-02-04 DIAGNOSIS — Z8249 Family history of ischemic heart disease and other diseases of the circulatory system: Secondary | ICD-10-CM | POA: Diagnosis not present

## 2022-02-04 DIAGNOSIS — I10 Essential (primary) hypertension: Secondary | ICD-10-CM | POA: Diagnosis not present

## 2022-02-04 DIAGNOSIS — Z823 Family history of stroke: Secondary | ICD-10-CM | POA: Diagnosis not present

## 2022-02-04 DIAGNOSIS — M1990 Unspecified osteoarthritis, unspecified site: Secondary | ICD-10-CM | POA: Diagnosis not present

## 2022-02-04 DIAGNOSIS — M810 Age-related osteoporosis without current pathological fracture: Secondary | ICD-10-CM | POA: Diagnosis not present

## 2022-02-04 DIAGNOSIS — L538 Other specified erythematous conditions: Secondary | ICD-10-CM | POA: Diagnosis not present

## 2022-02-04 DIAGNOSIS — Z803 Family history of malignant neoplasm of breast: Secondary | ICD-10-CM | POA: Diagnosis not present

## 2022-02-04 DIAGNOSIS — Z7983 Long term (current) use of bisphosphonates: Secondary | ICD-10-CM | POA: Diagnosis not present

## 2022-02-04 DIAGNOSIS — L820 Inflamed seborrheic keratosis: Secondary | ICD-10-CM | POA: Diagnosis not present

## 2022-02-04 DIAGNOSIS — H04129 Dry eye syndrome of unspecified lacrimal gland: Secondary | ICD-10-CM | POA: Diagnosis not present

## 2022-02-04 DIAGNOSIS — Z85828 Personal history of other malignant neoplasm of skin: Secondary | ICD-10-CM | POA: Diagnosis not present

## 2022-02-04 DIAGNOSIS — L570 Actinic keratosis: Secondary | ICD-10-CM | POA: Diagnosis not present

## 2022-02-04 DIAGNOSIS — Z008 Encounter for other general examination: Secondary | ICD-10-CM | POA: Diagnosis not present

## 2022-02-04 DIAGNOSIS — L814 Other melanin hyperpigmentation: Secondary | ICD-10-CM | POA: Diagnosis not present

## 2022-02-04 DIAGNOSIS — R32 Unspecified urinary incontinence: Secondary | ICD-10-CM | POA: Diagnosis not present

## 2022-03-05 ENCOUNTER — Ambulatory Visit (INDEPENDENT_AMBULATORY_CARE_PROVIDER_SITE_OTHER): Payer: Medicare HMO | Admitting: Family Medicine

## 2022-03-05 ENCOUNTER — Encounter: Payer: Self-pay | Admitting: Family Medicine

## 2022-03-05 VITALS — BP 138/74 | HR 83 | Resp 16 | Ht 63.0 in | Wt 98.5 lb

## 2022-03-05 DIAGNOSIS — Z111 Encounter for screening for respiratory tuberculosis: Secondary | ICD-10-CM

## 2022-03-05 DIAGNOSIS — I10 Essential (primary) hypertension: Secondary | ICD-10-CM

## 2022-03-05 DIAGNOSIS — M818 Other osteoporosis without current pathological fracture: Secondary | ICD-10-CM

## 2022-03-05 DIAGNOSIS — E7800 Pure hypercholesterolemia, unspecified: Secondary | ICD-10-CM

## 2022-03-05 DIAGNOSIS — E559 Vitamin D deficiency, unspecified: Secondary | ICD-10-CM

## 2022-03-05 NOTE — Progress Notes (Signed)
Established patient visit  I,[name],acting as a scribe for [name], MD.,have documented all relevant documentation on the behalf of [name], MD,as directed by  [name], MD while in the presence of [name], MD.   Patient: Morgan Ponce   DOB: [birth year]   81 y.o. Female  MRN: [id number] Visit Date: 03/05/2022  Today's healthcare provider: [name], MD   No chief complaint on file.  Subjective    HPI  Patient is here to complete a form for retirement home. She needs a form filled out with some information that has not been available for my exam and she needs tuberculosis test She is feeling well. She is a widow with no children.   Medications: Outpatient Medications Prior to Visit  Medication Sig   alendronate (FOSAMAX) 70 MG tablet Take 1 tablet (70 mg total) by mouth every 7 (seven) days. Take with a full glass of water on an empty stomach.   aspirin 81 MG tablet Take 81 mg at bedtime by mouth.    Blood Pressure Monitoring (BLOOD PRESSURE KIT) DEVI Check BP twice a day in morning and evening   Polyvinyl Alcohol-Povidone (REFRESH OP) Place 1 drop 5 (five) times daily as needed into both eyes (DRY EYES).   rosuvastatin (CRESTOR) 10 MG tablet TAKE 1 TABLET BY MOUTH EVERY DAY   Vitamin D, Cholecalciferol, 1000 UNITS TABS Take 1 tablet by mouth daily.   losartan (COZAAR) 50 MG tablet Take 1 tablet (50 mg total) by mouth in the morning and at bedtime.   No facility-administered medications prior to visit.    Review of Systems  Last lipids Lab Results  Component Value Date   CHOL 150 12/25/2021   HDL 75 12/25/2021   LDLCALC 64 12/25/2021   TRIG 51 12/25/2021   CHOLHDL 2.0 12/25/2021       Objective    BP (!) 165/74 (BP Location: Left Arm, Patient Position: Sitting, Cuff Size: Normal)   Pulse 83   Resp 16   Ht _0  (1.6 m)   Wt 98 lb 8 oz (44.7 kg)   SpO2 97%   BMI 17.45 kg/m  BP Readings from Last 3  Encounters:  03/05/22 (!) 165/74  12/25/21 130/75  11/20/21 (!) 143/86   Wt Readings from Last 3 Encounters:  03/05/22 98 lb 8 oz (44.7 kg)  12/24/21 100 lb (45.4 kg)  11/20/21 102 lb 3.2 oz (46.4 kg)      Physical Exam Vitals reviewed.  Constitutional:      Appearance: She is well-developed.  HENT:     Head: Normocephalic and atraumatic.     Comments: Bilateral hearing aides.    Right Ear: External ear normal.     Left Ear: External ear normal.     Nose: Nose normal.  Eyes:     General: No scleral icterus.    Conjunctiva/sclera: Conjunctivae normal.  Neck:     Thyroid: No thyromegaly.     Vascular: No carotid bruit.  Cardiovascular:     Rate and Rhythm: Normal rate and regular rhythm.     Heart sounds: Normal heart sounds.  Pulmonary:     Effort: Pulmonary effort is normal.     Breath sounds: Normal breath sounds.  Chest:  Breasts:    Right: Normal.     Left: Normal.  Abdominal:     Palpations: Abdomen is soft.  Musculoskeletal:        General:  No tenderness.     Right lower leg: No edema.     Left lower leg: No edema.  Lymphadenopathy:     Cervical: No cervical adenopathy.  Skin:    General: Skin is warm and dry.     Comments: Very fair skin.  Neurological:     General: No focal deficit present.     Mental Status: She is alert and oriented to person, place, and time.     Comments: Gait is normal and patient is actually able to stand on one leg fairly well for her balance. [place] also normal.  Psychiatric:        Mood and Affect: Mood normal.        Behavior: Behavior normal.        Thought Content: Thought content normal.        Judgment: Judgment normal.       No results found for any visits on 03/05/22.  Assessment & Plan     1. Screening for tuberculosis  - QuantiFERON-TB Gold Plus  2. Primary hypertension Controlled Recheck blood pressure 138/74  3. Other osteoporosis without current pathological fracture BMD 2025  4. Avitaminosis  D   5. Hypercholesteremia    Return in about 3 months (around 06/05/2022).      I, [name], MD, have reviewed all documentation for this visit. The documentation on 03/09/22 for the exam, diagnosis, procedures, and orders are all accurate and complete.    Shawnte Demarest Cranford Mon, MD  [facility] [phone] (phone) [phone] (fax)  [place]

## 2022-03-08 ENCOUNTER — Other Ambulatory Visit: Payer: Self-pay | Admitting: Physician Assistant

## 2022-03-08 DIAGNOSIS — I10 Essential (primary) hypertension: Secondary | ICD-10-CM

## 2022-03-08 LAB — QUANTIFERON-TB GOLD PLUS
QuantiFERON Mitogen Value: >10 IU/mL
QuantiFERON Nil Value: 0.06 IU/mL
QuantiFERON TB1 Ag Value: 0.09 IU/mL
QuantiFERON TB2 Ag Value: 0.08 IU/mL
QuantiFERON-TB Gold Plus: NEGATIVE

## 2022-03-08 NOTE — Telephone Encounter (Signed)
Requested medications are due for refill today.  unsure  Requested medications are on the active medications list.  yes  Last refill. 10/09/2021 #180 1 refill  Future visit scheduled.   yes  Notes to clinic.  Prescription written to expired 01/07/2022 - Rx is expired.    Requested Prescriptions  Pending Prescriptions Disp Refills   losartan (COZAAR) 50 MG tablet [Pharmacy Med Name: LOSARTAN POTASSIUM 50 MG TAB] 180 tablet 1    Sig: Take 1 tablet (50 mg total) by mouth in the morning and at bedtime.     Cardiovascular:  Angiotensin Receptor Blockers Failed - 03/08/2022 10:23 AM      Failed - Cr in normal range and within 180 days    Creatinine  Date Value Ref Range Status  02/12/2012 0.87 0.60 - 1.30 mg/dL Final   Creatinine, Ser  Date Value Ref Range Status  12/25/2021 1.05 (H) 0.57 - 1.00 mg/dL Final         Failed - Last BP in normal range    BP Readings from Last 1 Encounters:  03/05/22 (!) 165/74         Passed - K in normal range and within 180 days    Potassium  Date Value Ref Range Status  12/25/2021 4.6 3.5 - 5.2 mmol/L Final  02/12/2012 4.8 3.5 - 5.1 mmol/L Final         Passed - Patient is not pregnant      Passed - Valid encounter within last 6 months    Recent Outpatient Visits           3 days ago Screening for tuberculosis   [facility] Jerrol Banana., MD   2 months ago Encounter for [employer] annual wellness exam   [facility] Jerrol Banana., MD   3 months ago Primary hypertension   [facility] Mikey Kirschner, PA-C   5 months ago [facility]dore Mins, [place], PA-C   5 months ago Primary hypertension   [facility] Mikey Kirschner, PA-C       Future Appointments             In 3 months Jerrol Banana., MD [facility], [place]   In 9 months Jerrol Banana., MD [facility] [place]

## 2022-04-29 ENCOUNTER — Other Ambulatory Visit: Payer: Self-pay | Admitting: Family Medicine

## 2022-04-29 DIAGNOSIS — I10 Essential (primary) hypertension: Secondary | ICD-10-CM

## 2022-04-29 NOTE — Telephone Encounter (Signed)
Medication Refill: losartan (COZAAR) 50 MG    [facility]  [id number] [place] [place]:  [phone] Fax:  [phone]      Last appointment: 03/05/22  Patient would like 90 day supply.

## 2022-04-30 MED ORDER — LOSARTAN POTASSIUM 50 MG PO TABS: 50.0000 mg | ORAL_TABLET | Freq: Two times a day (BID) | ORAL | 1 refills | Status: DC

## 2022-04-30 NOTE — Telephone Encounter (Signed)
Requested Prescriptions  Pending Prescriptions Disp Refills  . losartan (COZAAR) 50 MG tablet 60 tablet 0    Sig: Take 1 tablet (50 mg total) by mouth in the morning and at bedtime. Please call and make an appt with your PCP within the next month.     Cardiovascular:  Angiotensin Receptor Blockers Failed - 04/29/2022  2:36 PM      Failed - Cr in normal range and within 180 days    Creatinine  Date Value Ref Range Status  02/12/2012 0.87 0.60 - 1.30 mg/dL Final   Creatinine, Ser  Date Value Ref Range Status  12/25/2021 1.05 (H) 0.57 - 1.00 mg/dL Final         Passed - K in normal range and within 180 days    Potassium  Date Value Ref Range Status  12/25/2021 4.6 3.5 - 5.2 mmol/L Final  02/12/2012 4.8 3.5 - 5.1 mmol/L Final         Passed - Patient is not pregnant      Passed - Last BP in normal range    BP Readings from Last 1 Encounters:  03/09/22 138/74         Passed - Valid encounter within last 6 months    Recent Outpatient Visits          1 month ago Screening for tuberculosis   [facility] Jerrol Banana., MD   4 months ago Encounter for [employer] annual wellness exam   [facility] Jerrol Banana., MD   5 months ago Primary hypertension   [facility] Mikey Kirschner, PA-C   6 months ago [facility] Mins, [place], PA-C   7 months ago Primary hypertension   [facility] Mikey Kirschner, PA-C      Future Appointments            In 1 month Jerrol Banana., MD [facility], [place]   In 8 months Jerrol Banana., MD [facility], [place]

## 2022-06-23 ENCOUNTER — Other Ambulatory Visit: Payer: Self-pay | Admitting: Physician Assistant

## 2022-06-23 DIAGNOSIS — E7800 Pure hypercholesterolemia, unspecified: Secondary | ICD-10-CM

## 2022-06-24 DIAGNOSIS — H43813 Vitreous degeneration, bilateral: Secondary | ICD-10-CM | POA: Diagnosis not present

## 2022-06-25 DIAGNOSIS — D2272 Melanocytic nevi of left lower limb, including hip: Secondary | ICD-10-CM | POA: Diagnosis not present

## 2022-06-25 DIAGNOSIS — C44619 Basal cell carcinoma of skin of left upper limb, including shoulder: Secondary | ICD-10-CM | POA: Diagnosis not present

## 2022-06-25 DIAGNOSIS — L720 Epidermal cyst: Secondary | ICD-10-CM | POA: Diagnosis not present

## 2022-06-25 DIAGNOSIS — D485 Neoplasm of uncertain behavior of skin: Secondary | ICD-10-CM | POA: Diagnosis not present

## 2022-06-25 DIAGNOSIS — D2261 Melanocytic nevi of right upper limb, including shoulder: Secondary | ICD-10-CM | POA: Diagnosis not present

## 2022-06-25 DIAGNOSIS — D2271 Melanocytic nevi of right lower limb, including hip: Secondary | ICD-10-CM | POA: Diagnosis not present

## 2022-06-25 DIAGNOSIS — Z85828 Personal history of other malignant neoplasm of skin: Secondary | ICD-10-CM | POA: Diagnosis not present

## 2022-06-26 ENCOUNTER — Ambulatory Visit: Payer: Medicare HMO | Admitting: Family Medicine

## 2022-07-01 ENCOUNTER — Encounter: Payer: Self-pay | Admitting: Family Medicine

## 2022-07-01 ENCOUNTER — Ambulatory Visit (INDEPENDENT_AMBULATORY_CARE_PROVIDER_SITE_OTHER): Payer: Medicare HMO | Admitting: Family Medicine

## 2022-07-01 VITALS — BP 185/87 | HR 79 | Temp 97.6°F | Resp 16 | Ht 63.0 in | Wt 101.0 lb

## 2022-07-01 DIAGNOSIS — Z23 Encounter for immunization: Secondary | ICD-10-CM | POA: Diagnosis not present

## 2022-07-01 DIAGNOSIS — I10 Essential (primary) hypertension: Secondary | ICD-10-CM | POA: Diagnosis not present

## 2022-07-01 DIAGNOSIS — E7800 Pure hypercholesterolemia, unspecified: Secondary | ICD-10-CM | POA: Diagnosis not present

## 2022-07-01 MED ORDER — ROSUVASTATIN CALCIUM 10 MG PO TABS: 5.0000 mg | ORAL_TABLET | Freq: Every day | ORAL | 1 refills | Status: DC

## 2022-07-01 NOTE — Progress Notes (Signed)
Established patient visit  I,Morgan Ponce,acting as a scribe for [employer], MD.,have documented all relevant documentation on the behalf of Morgan Foster, MD,as directed by  Morgan Foster, MD while in the presence of Morgan Foster, MD.   Patient: Morgan Ponce   DOB: Jan 23, 1941   81 y.o. Female  MRN: [id number] Visit Date: 07/01/2022  Today's healthcare provider: [name], MD   Chief Complaint  Patient presents with   Follow-Up Chronic Disease   Subjective    HPI  Hypertension, follow-up  BP Readings from Last 3 Encounters:  07/01/22 (!) 185/87  03/09/22 138/74  12/25/21 130/75   Wt Readings from Last 3 Encounters:  07/01/22 101 lb (45.8 kg)  03/05/22 98 lb 8 oz (44.7 kg)  12/24/21 100 lb (45.4 kg)     She was last seen for hypertension 3 months ago.  BP at that visit was 138/74. Management since that visit includes continue current medication. She reports excellent compliance with treatment. She is not having side effects.  She is adherent to low salt diet.   Outside blood pressures are 130's-140's/60-70  --------------------------------------------------------------------------------------------------- Lipid/Cholesterol, follow-up  Last Lipid Panel: Lab Results  Component Value Date   CHOL 150 12/25/2021   LDLCALC 64 12/25/2021   HDL 75 12/25/2021   TRIG 51 12/25/2021    She was last seen for this 6 months ago.  Management since that visit includes consider cutting rosuvastatin in half if CK is elevated but with carotid stenosis I think would be a good idea to stay on this..  She reports excellent compliance with treatment. She is not having side effects.   Symptoms: No appetite changes No foot ulcerations  No chest pain No chest pressure/discomfort  No dyspnea No orthopnea  No fatigue No lower extremity edema  No palpitations No paroxysmal nocturnal dyspnea  No nausea Yes-numbness  or tingling of extremity  No polydipsia No polyuria  No speech difficulty No syncope   She is following a  healthy  diet. Current exercise: none  Last metabolic panel Lab Results  Component Value Date   GLUCOSE 100 (H) 12/25/2021   NA 137 12/25/2021   K 4.6 12/25/2021   BUN 13 12/25/2021   CREATININE 1.05 (H) 12/25/2021   EGFR 53 (L) 12/25/2021   GFRNONAA 46 (L) 11/16/2019   CALCIUM 9.6 12/25/2021   AST 31 12/25/2021   ALT 15 12/25/2021   The ASCVD Risk score (Arnett DK, et al., 2019) failed to calculate for the following reasons:   The 2019 ASCVD risk score is only valid for ages 60 to 75  ---------------------------------------------------------------------------------------------------   Medications: Outpatient Medications Prior to Visit  Medication Sig   alendronate (FOSAMAX) 70 MG tablet Take 1 tablet (70 mg total) by mouth every 7 (seven) days. Take with a full glass of water on an empty stomach.   aspirin 81 MG tablet Take 81 mg at bedtime by mouth.    Blood Pressure Monitoring (BLOOD PRESSURE KIT) DEVI Check BP twice a day in morning and evening   losartan (COZAAR) 50 MG tablet Take 1 tablet (50 mg total) by mouth in the morning and at bedtime. Please call and make an appt with your PCP within the next month.   Polyvinyl Alcohol-Povidone (REFRESH OP) Place 1 drop 5 (five) times daily as needed into both eyes (DRY EYES).   Vitamin D, Cholecalciferol, 1000 UNITS TABS Take 1 tablet by mouth daily.   [DISCONTINUED] rosuvastatin (CRESTOR) 10 MG tablet TAKE  1 TABLET BY MOUTH EVERY DAY   No facility-administered medications prior to visit.    Review of Systems     Objective    BP (!) 185/87 (BP Location: Left Arm, Patient Position: Sitting, Cuff Size: Small)   Pulse 79   Temp 97.6 F (36.4 C) (Oral)   Resp 16   Ht '5\' 3"'  (1.6 m)   Wt 101 lb (45.8 kg)   BMI 17.89 kg/m    Physical Exam Vitals reviewed.  Constitutional:      General: She is not in acute  distress.    Appearance: Normal appearance. She is not ill-appearing, toxic-appearing or diaphoretic.  Eyes:     Conjunctiva/sclera: Conjunctivae normal.  Cardiovascular:     Rate and Rhythm: Normal rate and regular rhythm.     Pulses: Normal pulses.     Heart sounds: Normal heart sounds. No murmur heard.    No friction rub. No gallop.  Pulmonary:     Effort: Pulmonary effort is normal. No respiratory distress.     Breath sounds: Normal breath sounds. No stridor. No wheezing, rhonchi or rales.  Musculoskeletal:     Right lower leg: No edema.     Left lower leg: No edema.     Comments: 5/5 strength in bilateral lower extremities   Neurological:     Mental Status: She is alert and oriented to person, place, and time.       No results found for any visits on 07/01/22.  Assessment & Plan     Problem List Items Addressed This Visit       Cardiovascular and Mediastinum   Hypertension - Primary    Chronic, stable  Patient with elevated BP in office, reports white coat htn hx Reviewed home BP recordings that ranged from 130s/70s  Recommended that she continue to record BP 1-2 hours after taking medication  She will continue losartan 80m daily  CMP  Will f/u in 1 month for BP check       Relevant Medications   rosuvastatin (CRESTOR) 10 MG tablet   Other Relevant Orders   Comprehensive metabolic panel     Other   Hypercholesteremia    Chronic, controlled  Reviewed last lipid panel and levels were within goal range  She continues to have soreness in her legs, CK was measured and within normal levels in June 2023  Recommended decreasing crestor to 561mdaily and follow up in 1 month  We will check lipid panel today        Relevant Medications   rosuvastatin (CRESTOR) 10 MG tablet   Other Relevant Orders   Lipid panel   Other Visit Diagnoses     Need for immunization against influenza       Relevant Orders   Flu Vaccine QUAD High Dose(Fluad) (Completed)         Return in about 1 month (around 08/01/2022) for cholesterol, HTN .      I, MaEulis FosterMD, have reviewed all documentation for this visit. The documentation on 07/01/22 for the exam, diagnosis, procedures, and orders are all accurate and complete.    MaEulis FosterMD  [phone]) [phone])  [place]

## 2022-07-01 NOTE — Assessment & Plan Note (Addendum)
Chronic, stable  Patient with elevated BP in office, reports white coat htn hx Reviewed home BP recordings that ranged from 130s/70s  Recommended that she continue to record BP 1-2 hours after taking medication  She will continue losartan '50mg'$  daily  CMP  Will f/u in 1 month for BP check

## 2022-07-01 NOTE — Patient Instructions (Addendum)
It was a pleasure to see you today!  Thank you for choosing [facility] for your primary care.   Morgan Ponce was seen for high blood pressure.   Our plans for today were: For your blood pressure, your records from home indicate that your blood pressure has been in normal range. I recommend that you continue your current medications.  For your cholesterol, please starting taking '5mg'$  daily instead of '10mg'$  daily of the rosuvastatin. Please continue to let us know if you have the soreness in your legs  To keep you healthy, please keep in mind the following health maintenance items that you are due for:   Shingles Vaccine   Tetanus booster    You should return to our clinic in 1 month for cholesterol and blood pressure.   Best Wishes,   Dr. Quentin Cornwall

## 2022-07-01 NOTE — Assessment & Plan Note (Signed)
Chronic, controlled  Reviewed last lipid panel and levels were within goal range  She continues to have soreness in her legs, CK was measured and within normal levels in June 2023  Recommended decreasing crestor to '5mg'$  daily and follow up in 1 month  We will check lipid panel today

## 2022-07-02 ENCOUNTER — Telehealth: Payer: Self-pay | Admitting: Family Medicine

## 2022-07-02 ENCOUNTER — Other Ambulatory Visit: Payer: Self-pay | Admitting: Family Medicine

## 2022-07-02 DIAGNOSIS — E7800 Pure hypercholesterolemia, unspecified: Secondary | ICD-10-CM

## 2022-07-02 LAB — COMPREHENSIVE METABOLIC PANEL
ALT: 14 IU/L (ref 0–32)
AST: 26 IU/L (ref 0–40)
Albumin/Globulin Ratio: 1.4 (ref 1.2–2.2)
Albumin: 4 g/dL (ref 3.7–4.7)
Alkaline Phosphatase: 56 IU/L (ref 44–121)
BUN/Creatinine Ratio: 18 (ref 12–28)
BUN: 17 mg/dL (ref 8–27)
Bilirubin Total: 0.5 mg/dL (ref 0.0–1.2)
CO2: 24 mmol/L (ref 20–29)
Calcium: 9.3 mg/dL (ref 8.7–10.3)
Chloride: 103 mmol/L (ref 96–106)
Creatinine, Ser: 0.97 mg/dL (ref 0.57–1.00)
Globulin, Total: 2.9 g/dL (ref 1.5–4.5)
Glucose: 97 mg/dL (ref 70–99)
Potassium: 4.3 mmol/L (ref 3.5–5.2)
Sodium: 142 mmol/L (ref 134–144)
Total Protein: 6.9 g/dL (ref 6.0–8.5)
eGFR: 58 mL/min/{1.73_m2} — ABNORMAL LOW (ref >59)

## 2022-07-02 LAB — LIPID PANEL
Chol/HDL Ratio: 2.1 ratio (ref 0.0–4.4)
Cholesterol, Total: 151 mg/dL (ref 100–199)
HDL: 72 mg/dL (ref >39)
LDL Chol Calc (NIH): 66 mg/dL (ref 0–99)
Triglycerides: 66 mg/dL (ref 0–149)
VLDL Cholesterol Cal: 13 mg/dL (ref 5–40)

## 2022-07-02 MED ORDER — ROSUVASTATIN CALCIUM 5 MG PO TABS: 5.0000 mg | ORAL_TABLET | Freq: Every day | ORAL | 1 refills | Status: DC

## 2022-07-02 NOTE — Telephone Encounter (Signed)
Prescription for rosuvastatin 5 mg sent to patient's pharmacy with 90-day supply  [name], MD [facility]

## 2022-07-02 NOTE — Telephone Encounter (Signed)
Medication Refill - Medication: rosuvastatin (CRESTOR) 5 MG tablet  Has the patient contacted their pharmacy? No  Preferred Pharmacy (with phone number or street name):  [id number] Phone:  [phone] Fax:  [phone]    Has the patient been seen for an appointment in the last year OR does the patient have an upcoming appointment? Yes.    Pt states  rosuvastatin (CRESTOR) 10 MG tablet was decreased to 5 mg by provider and she is unable to cut current 10 mg tablets evenly  Pt is requesting a 90 day supply

## 2022-07-02 NOTE — Telephone Encounter (Signed)
Patient advised.

## 2022-07-16 DIAGNOSIS — C44619 Basal cell carcinoma of skin of left upper limb, including shoulder: Secondary | ICD-10-CM | POA: Diagnosis not present

## 2022-07-24 ENCOUNTER — Other Ambulatory Visit: Payer: Self-pay

## 2022-07-24 ENCOUNTER — Encounter: Payer: Self-pay | Admitting: Emergency Medicine

## 2022-07-24 ENCOUNTER — Emergency Department: Payer: Medicare HMO

## 2022-07-24 ENCOUNTER — Emergency Department
Admission: EM | Admit: 2022-07-24 | Discharge: 2022-07-24 | Disposition: A | Discharge status: Home / Self Care | Payer: Medicare HMO | Attending: Emergency Medicine | Admitting: Emergency Medicine

## 2022-07-24 ENCOUNTER — Ambulatory Visit: Payer: Self-pay | Admitting: *Deleted

## 2022-07-24 DIAGNOSIS — Y92002 Bathroom of unspecified non-institutional (private) residence single-family (private) house as the place of occurrence of the external cause: Secondary | ICD-10-CM | POA: Insufficient documentation

## 2022-07-24 DIAGNOSIS — W2201XA Walked into wall, initial encounter: Secondary | ICD-10-CM | POA: Insufficient documentation

## 2022-07-24 DIAGNOSIS — S0083XA Contusion of other part of head, initial encounter: Secondary | ICD-10-CM | POA: Insufficient documentation

## 2022-07-24 DIAGNOSIS — I10 Essential (primary) hypertension: Secondary | ICD-10-CM | POA: Insufficient documentation

## 2022-07-24 DIAGNOSIS — S0990XA Unspecified injury of head, initial encounter: Secondary | ICD-10-CM | POA: Diagnosis not present

## 2022-07-24 NOTE — Telephone Encounter (Signed)
Agree with ED evaluation and consideration for imaging.  Patient should follow-up in office after she is seen in the ED  [name], MD  [place]

## 2022-07-24 NOTE — Telephone Encounter (Addendum)
  Chief Complaint: Has a quarter sized red swollen knot on her forehead over her nose and down into her left eyebrow from running into the wall last night about 10:00. Symptoms: Denies any symptoms except an occasional shooting pain and soreness over the knot    She is on 81 mg aspirin a day Frequency: Happened last night Pertinent Negatives: Patient denies dizziness, fainting, LOC, lightheadedness or or other injuries.  Did not trip.    Disposition: '[x]'$ ED /'[]'$ Urgent Care (no appt availability in office) / '[]'$ Appointment(In office/virtual)/ '[]'$  [facility]/ '[]'$ Home Care/ '[]'$ Refused Recommended Disposition /'[]'$ [facility]/ '[]'$  Follow-up with PCP Additional Notes: I have referred her to the ED.   She lives at a retirement community so going to see if they will take her to the ED if not she is agreeable to calling 911.  I sent my notes to Dr. Alba Cory at [facility] high priority.  Did not get an answer when attempted to call into the practice.

## 2022-07-24 NOTE — ED Triage Notes (Signed)
Ran into a wall last night when getting up to go to the Bathroom.  No LOC.  Swelling to right forehead.  Denies taking any blood thinners.  AAOx3.  Skin warm and dry. NAD

## 2022-07-24 NOTE — ED Notes (Signed)
See triage note. Pt has swelling to forehead after walking into wall last night when got up to bathroom. Ambulatory to treatment room with steady gait.

## 2022-07-24 NOTE — ED Notes (Signed)
Pt walked to bathroom and back with steady gait. Provider at bedside again talking to pt.

## 2022-07-24 NOTE — Telephone Encounter (Signed)
Reason for Disposition  [1] Age over 36 years AND [2] swelling or bruise  Answer Assessment - Initial Assessment Questions 1. MECHANISM: "How did the injury happen?" For falls, ask: "What height did you fall from?" and "What surface did you fall against?"      I got up to go to the bathroom when walked out into hallway to go to the bathroom I hit the wall.   I have a quarter sized swollen knot on my forehead.   It's still swollen.     I take an 81 mg aspirin daily. I have night lights but I just didn't see the wall and walked into it. 2. ONSET: "When did the injury happen?" (Minutes or hours ago)      Last night Swelling in middle of my forehead over my nose and to my left eyebrow.   It bled under the skin.   No outside bleeding.   Not cut.    3. NEUROLOGIC SYMPTOMS: "Was there any loss of consciousness?" "Are there any other neurological symptoms?"      No dizziness or lightheadedness.   A pain will shoot through occasionally.    No visual changes.  4. MENTAL STATUS: "Does the person know who they are, who you are, and where they are?"      Yes 5. LOCATION: "What part of the head was hit?"      See above 6. SCALP APPEARANCE: "What does the scalp look like? Is it bleeding now?" If Yes, ask: "Is it difficult to stop?"      Large bruise quarter sized area over nose on forehead.   7. SIZE: For cuts, bruises, or swelling, ask: "How large is it?" (e.g., inches or centimeters)      Quarter sized.     I sat up all night after it happened.     8. PAIN: "Is there any pain?" If Yes, ask: "How bad is it?"  (e.g., Scale 1-10; or mild, moderate, severe)     Quarter sized. No headache.  Occasional shooting pain 9. TETANUS: For any breaks in the skin, ask: "When was the last tetanus booster?"     N/A 10. OTHER SYMPTOMS: "Do you have any other symptoms?" (e.g., neck pain, vomiting)       No other injuries 11. PREGNANCY: "Is there any chance you are pregnant?" "When was your last menstrual period?"        NA  Protocols used: Head Injury-A-AH

## 2022-07-24 NOTE — ED Provider Notes (Signed)
[facility] Provider Note    Event Date/Time   First MD Initiated Contact with Patient 07/24/22 1035     (approximate)   History   Head Injury   HPI  Morgan Ponce is a 81 y.o. female with history of hypertension hypercholesterolemia, on 81 mg aspirin daily, who presents with a head injury, acute onset last night around 10 PM when she got up out of bed and hit her head on a wall.  She did not lose consciousness.  She reports localized pain and swelling to the area of her left forehead where she hit.  She denies any nausea or vomiting.   I reviewed the past medical records.  The patient's most recent outpatient encounter was on 07/01/2022 with [facility] family practice for routine follow-up of her chronic medical issues.  She has no recent ED visits or admissions.  Physical Exam   Triage Vital Signs: ED Triage Vitals  Enc Vitals Group     BP 07/24/22 1036 (!) 186/91     Pulse Rate 07/24/22 1036 100     Resp 07/24/22 1036 16     Temp 07/24/22 1036 98 F (36.7 C)     Temp Source 07/24/22 1036 Oral     SpO2 07/24/22 1036 96 %     Weight 07/24/22 1025 100 lb 15.5 oz (45.8 kg)     Height 07/24/22 1025 '5\' 3"'$  (1.6 m)     Head Circumference --      Peak Flow --      Pain Score 07/24/22 1025 0     Pain Loc --      Pain Edu? --      Excl. in [place]? --     Most recent vital signs: Vitals:   07/24/22 1036  BP: (!) 186/91  Pulse: 100  Resp: 16  Temp: 98 F (36.7 C)  SpO2: 96%     General: Awake, no distress.  CV:  Good peripheral perfusion.  Resp:  Normal effort.  Abd:  No distention.  Other:  Left forehead hematoma and ecchymosis.  EOMI.  PERRLA.  No facial droop.  Cranial nerves III through XII intact.  Motor intact in all extremities.  No pronator drift.  Normal coordination with no ataxia on finger-to-nose.  No midline cervical spinal tenderness.   ED Results / Procedures / Treatments   Labs (all labs ordered are listed, but only abnormal  results are displayed) Labs Reviewed - No data to display   EKG     RADIOLOGY  CT head: I independently viewed and interpreted the images; there is no ICH or other acute abnormality   PROCEDURES:  Critical Care performed: No  Procedures   MEDICATIONS ORDERED IN ED: Medications - No data to display   IMPRESSION / MDM / [place] / ED COURSE  I reviewed the triage vital signs and the nursing notes.  81 year old female on aspirin presents with a head injury last night with no LOC.  On examination, her vital signs are normal except for hypertension and the neurologic exam is normal.  Differential diagnosis includes, but is not limited to, minor head injury, concussion, less likely intracranial hemorrhage.  Given the patient's age and aspirin use we will obtain a CT.  There is no evidence of cervical spinal injury and the patient can be ruled out clinically.  Patient's presentation is most consistent with acute presentation with potential threat to life or bodily function.  ----------------------------------------- 12:24 PM on  07/24/2022 -----------------------------------------  CT head is negative.  The patient is stable for discharge home.  Return precautions provided, and she expresses understanding.   FINAL CLINICAL IMPRESSION(S) / ED DIAGNOSES   Final diagnoses:  Minor head injury, initial encounter     Rx / DC Orders   ED Discharge Orders     None        Note:  This document was prepared using Dragon voice recognition software and may include unintentional dictation errors.    Arta Silence, MD 07/24/22 1225

## 2022-07-31 ENCOUNTER — Encounter: Payer: Self-pay | Admitting: Family Medicine

## 2022-07-31 ENCOUNTER — Ambulatory Visit (INDEPENDENT_AMBULATORY_CARE_PROVIDER_SITE_OTHER): Payer: Medicare HMO | Admitting: Family Medicine

## 2022-07-31 ENCOUNTER — Other Ambulatory Visit: Payer: Self-pay | Admitting: Family Medicine

## 2022-07-31 VITALS — BP 170/88 | HR 85 | Temp 97.9°F | Resp 16 | Wt 101.6 lb

## 2022-07-31 DIAGNOSIS — I10 Essential (primary) hypertension: Secondary | ICD-10-CM

## 2022-07-31 DIAGNOSIS — S0083XS Contusion of other part of head, sequela: Secondary | ICD-10-CM

## 2022-07-31 DIAGNOSIS — E7800 Pure hypercholesterolemia, unspecified: Secondary | ICD-10-CM

## 2022-07-31 MED ORDER — LOSARTAN POTASSIUM 50 MG PO TABS: ORAL_TABLET | ORAL | 0 refills | Status: DC

## 2022-07-31 NOTE — Patient Instructions (Addendum)
Please take 1 1/2 tablet of losartan in the AM   Take 1 tablet of losartan at night    Please check your blood pressure 1-2 hours after your medication in the morning and around dinner time for the next two weeks    Please plan to follow up in 2 weeks for BP   Please use warm compresses on your face to help with the healing hematoma for no more than 15-20 mins at a time

## 2022-07-31 NOTE — Progress Notes (Signed)
I,[name],acting as a scribe for [employer], MD.,have documented all relevant documentation on the behalf of [name], MD,as directed by  [name], MD while in the presence of [name], MD.   Established patient visit   Patient: Morgan Ponce   DOB: May 31, 1941   81 y.o. Female  MRN: [id number] Visit Date: 07/31/2022  Today's healthcare provider: [name], MD   Chief Complaint  Patient presents with   follow-up HTN   Subjective    HPI  Hypertension, follow-up  BP Readings from Last 3 Encounters:  07/31/22 (!) 170/88  07/24/22 (!) 186/91  07/01/22 (!) 185/87   Wt Readings from Last 3 Encounters:  07/31/22 101 lb 9.6 oz (46.1 kg)  07/24/22 100 lb (45.4 kg)  07/01/22 101 lb (45.8 kg)     She was last seen for hypertension 1 months ago.  BP at that visit was 186. Management since that visit includes continue losartan 25m daily .   Outside blood pressures are 120-140/60-80.  Symptoms: No chest pain No chest pressure  No palpitations No syncope  No dyspnea No orthopnea  No paroxysmal nocturnal dyspnea No lower extremity edema   Lipid/Cholesterol, Follow-up  Last lipid panel Other pertinent labs  Lab Results  Component Value Date   CHOL 151 07/01/2022   HDL 72 07/01/2022   LDLCALC 66 07/01/2022   TRIG 66 07/01/2022   CHOLHDL 2.1 07/01/2022   Lab Results  Component Value Date   ALT 14 07/01/2022   AST 26 07/01/2022   PLT 199 12/25/2021   TSH 3.070 12/25/2021     She was last seen for this 1 months ago.  Management since that visit includes Recommended decreasing crestor to 566mdaily, to see if leg soreness improves.  Follow up ER visit  Patient was seen in ER for minor head injury on 07/24/2022. Treatment for this included CT-Negative. She reports excellent compliance with treatment. She reports this condition is  Improved.  -----------------------------------------------------------------------------------------   Medications: Outpatient Medications Prior to Visit  Medication Sig   alendronate (FOSAMAX) 70 MG tablet Take 1 tablet (70 mg total) by mouth every 7 (seven) days. Take with a full glass of water on an empty stomach.   aspirin 81 MG tablet Take 81 mg at bedtime by mouth.    Blood Pressure Monitoring (BLOOD PRESSURE KIT) DEVI Check BP twice a day in morning and evening   Polyvinyl Alcohol-Povidone (REFRESH OP) Place 1 drop 5 (five) times daily as needed into both eyes (DRY EYES).   rosuvastatin (CRESTOR) 5 MG tablet Take 1 tablet (5 mg total) by mouth daily.   Vitamin D, Cholecalciferol, 1000 UNITS TABS Take 1 tablet by mouth daily.   [DISCONTINUED] losartan (COZAAR) 50 MG tablet Take 1 tablet (50 mg total) by mouth in the morning and at bedtime. Please call and make an appt with your PCP within the next month.   No facility-administered medications prior to visit.    Review of Systems     Objective    BP (!) 170/88 (BP Location: Left Arm, Patient Position: Sitting, Cuff Size: Small)   Pulse 85   Temp 97.9 F (36.6 C) (Oral)   Resp 16   Wt 101 lb 9.6 oz (46.1 kg)   BMI 18.00 kg/m    Physical Exam Vitals reviewed.  Constitutional:      General: She is not in acute distress.    Appearance: Normal appearance. She is not ill-appearing, toxic-appearing or diaphoretic.  HENT:     Head:      Comments: Healing ecchymosis in malar region Hematoma on left side of forehead, healing  Areas are nontender and noot bleeding  No swelling is impeding patient's vision  Eyes:     Conjunctiva/sclera: Conjunctivae normal.  Cardiovascular:     Rate and Rhythm: Normal rate and regular rhythm.     Pulses: Normal pulses.     Heart sounds: Normal heart sounds. No murmur heard.    No friction rub. No gallop.  Pulmonary:     Effort: Pulmonary effort is normal. No respiratory distress.      Breath sounds: Normal breath sounds. No stridor. No wheezing, rhonchi or rales.  Abdominal:     General: Bowel sounds are normal. There is no distension.     Palpations: Abdomen is soft.     Tenderness: There is no abdominal tenderness.  Musculoskeletal:     Right lower leg: No edema.     Left lower leg: No edema.  Skin:    Findings: No erythema or rash.  Neurological:     Mental Status: She is alert and oriented to person, place, and time.       No results found for any visits on 07/31/22.   Assessment & Plan     Problem List Items Addressed This Visit       Cardiovascular and Mediastinum   Hypertension - Primary    BP still remains elevated Not yet controlled/at goal  Will increase to 1.5 (57m) tabs of losartan in the AM and continue 1 tab (581m of losartan at night  She will follow up in 2-3 weeks for BP recheck        Relevant Medications   losartan (COZAAR) 50 MG tablet     Other   Hypercholesteremia    Stable  Will continue crestor 67m57maily       Relevant Medications   losartan (COZAAR) 50 MG tablet   Facial bruising, sequela    Patient walked into wall during the night resulting in multiple healing bruises on her face and forehead hematoma  She is overall feeling well and with no loss of vision, HA  Recommended warm compresses for short intervals to help with healing the bruising  Discussed that these are healing and will continue to take time for the body reabsorb, patient voiced understanding         Return in about 2 weeks (around 08/14/2022) for BP .      I, MakEulis FosterD, have reviewed all documentation for this visit. The documentation on 08/02/22 for the exam, diagnosis, procedures, and orders are all accurate and complete.  Portions of this information were initially documented by the CMA and reviewed by me for thoroughness and accuracy.      MakEulis FosterD  [phone]) [phone])  [place]

## 2022-08-01 NOTE — Telephone Encounter (Signed)
Requested medication (s) are due for refill today: Alternative Requested:NEED PA.   Requested medication (s) are on the active medication list:yes  Last refill:  07/31/22  Future visit scheduled: yes  Notes to clinic:  Alternative Requested:NEED PA.      Requested Prescriptions  Pending Prescriptions Disp Refills   losartan (COZAAR) 50 MG tablet [Pharmacy Med Name: LOSARTAN POTASSIUM 50 MG TAB] 225 tablet 0    Sig: Take 1 1/2 tablet in the morning and 1 tablet in the evening     Cardiovascular:  Angiotensin Receptor Blockers Failed - 07/31/2022  4:37 PM      Failed - Last BP in normal range    BP Readings from Last 1 Encounters:  07/31/22 (!) 170/88         Passed - Cr in normal range and within 180 days    Creatinine  Date Value Ref Range Status  02/12/2012 0.87 0.60 - 1.30 mg/dL Final   Creatinine, Ser  Date Value Ref Range Status  07/01/2022 0.97 0.57 - 1.00 mg/dL Final         Passed - K in normal range and within 180 days    Potassium  Date Value Ref Range Status  07/01/2022 4.3 3.5 - 5.2 mmol/L Final  02/12/2012 4.8 3.5 - 5.1 mmol/L Final         Passed - Patient is not pregnant      Passed - Valid encounter within last 6 months    Recent Outpatient Visits           Yesterday Primary hypertension   [facility] Simmons-Robinson, [place], MD   1 month ago Primary hypertension   [place], [place], MD   4 months ago Screening for tuberculosis   [facility] Jerrol Banana., MD   7 months ago Encounter for [employer] annual wellness exam   [facility] Jerrol Banana., MD   8 months ago Primary hypertension   [facility] Mikey Kirschner, PA-C       Future Appointments             In 3 weeks Simmons-Robinson, Riki Sheer, MD [facility], [place]

## 2022-08-02 DIAGNOSIS — S0083XS Contusion of other part of head, sequela: Secondary | ICD-10-CM | POA: Insufficient documentation

## 2022-08-02 NOTE — Assessment & Plan Note (Signed)
Patient walked into wall during the night resulting in multiple healing bruises on her face and forehead hematoma  She is overall feeling well and with no loss of vision, HA  Recommended warm compresses for short intervals to help with healing the bruising  Discussed that these are healing and will continue to take time for the body reabsorb, patient voiced understanding

## 2022-08-02 NOTE — Assessment & Plan Note (Signed)
BP still remains elevated Not yet controlled/at goal  Will increase to 1.5 ('75mg'$ ) tabs of losartan in the AM and continue 1 tab ('50mg'$ ) of losartan at night  She will follow up in 2-3 weeks for BP recheck

## 2022-08-02 NOTE — Assessment & Plan Note (Signed)
Stable  Will continue crestor '5mg'$  daily

## 2022-08-03 ENCOUNTER — Other Ambulatory Visit: Payer: Self-pay | Admitting: Family Medicine

## 2022-08-03 DIAGNOSIS — I10 Essential (primary) hypertension: Secondary | ICD-10-CM

## 2022-08-05 ENCOUNTER — Telehealth: Payer: Self-pay | Admitting: Family Medicine

## 2022-08-05 MED ORDER — LOSARTAN POTASSIUM 50 MG PO TABS: 50.0000 mg | ORAL_TABLET | Freq: Two times a day (BID) | ORAL | 1 refills | Status: DC

## 2022-08-05 MED ORDER — HYDRALAZINE HCL 10 MG PO TABS: 10.0000 mg | ORAL_TABLET | Freq: Two times a day (BID) | ORAL | 1 refills | Status: DC

## 2022-08-05 NOTE — Telephone Encounter (Signed)
Requested medications are due for refill today.  unsure  Requested medications are on the active medications list.  yes  Last refill. 07/31/2022  Future visit scheduled.   yes  Notes to clinic.  Pharmacy comment: Script Clarification:INSURANCE WILL NOT COVER DOSAGE.     Requested Prescriptions  Pending Prescriptions Disp Refills   losartan (COZAAR) 50 MG tablet [Pharmacy Med Name: LOSARTAN POTASSIUM 50 MG TAB] 225 tablet 0    Sig: Take 1 1/2 tablet in the morning and 1 tablet in the evening     Cardiovascular:  Angiotensin Receptor Blockers Failed - 08/03/2022  3:46 PM      Failed - Last BP in normal range    BP Readings from Last 1 Encounters:  07/31/22 (!) 170/88         Passed - Cr in normal range and within 180 days    Creatinine  Date Value Ref Range Status  02/12/2012 0.87 0.60 - 1.30 mg/dL Final   Creatinine, Ser  Date Value Ref Range Status  07/01/2022 0.97 0.57 - 1.00 mg/dL Final         Passed - K in normal range and within 180 days    Potassium  Date Value Ref Range Status  07/01/2022 4.3 3.5 - 5.2 mmol/L Final  02/12/2012 4.8 3.5 - 5.1 mmol/L Final         Passed - Patient is not pregnant      Passed - Valid encounter within last 6 months    Recent Outpatient Visits           5 days ago Primary hypertension   [place] Simmons-Robinson, [place], MD   1 month ago Primary hypertension   [place], [place], MD   5 months ago Screening for tuberculosis   [facility] Jerrol Banana., MD   7 months ago Encounter for [employer] annual wellness exam   [facility] Jerrol Banana., MD   8 months ago Primary hypertension   [facility] Mikey Kirschner, PA-C       Future Appointments             In 3 weeks Simmons-Robinson, Riki Sheer, MD [facility], [place]

## 2022-08-05 NOTE — Telephone Encounter (Signed)
I suspect it was not covered as it was above the highest total daily dose of '100mg'$ .  She can continue losartan '50mg'$  BID (can refill if needed).  For improving BP, can add hydralazine '10mg'$  BID (ok to rx #60 r1). Of note, patient has allergy to calcium channel blocker and has h/o hyponatremia with HCTZ, so that is why I'm choosing hydralazine.

## 2022-08-05 NOTE — Telephone Encounter (Signed)
Patient advised as below. Refill and new prescription sent to pharmacy.

## 2022-08-05 NOTE — Telephone Encounter (Signed)
The patient called in stating she was not able to pick up her medication on Saturday because the pharmacy states the insurance will not pay for the medication the way the prescription was written and for the providers office to contact the pharmacy. The prescription was for losartan (COZAAR) 50 MG tablet . Please assist patient further as soon as possible. [id number] [place] [phone] Fax: [phone]

## 2022-08-05 NOTE — Telephone Encounter (Signed)
Please review for Dr. Quentin Cornwall  Please advise.

## 2022-08-27 NOTE — Progress Notes (Unsigned)
I,[name],acting as a scribe for [employer], MD.,have documented all relevant documentation on the behalf of [name], MD,as directed by  [name], MD while in the presence of [name], MD.   Established patient visit   Patient: Morgan Ponce   DOB: 11-Jun-1941   81 y.o. Female  MRN: [id number] Visit Date: 08/28/2022  Today's healthcare provider: [name], MD   Chief Complaint  Patient presents with   Follow-up   Subjective    HPI  Hypertension: Patient here for follow-up of elevated blood pressure. Changes made at last office visit include increase to 1.5 (20m) tabs of losartan in the AM and continue 1 tab (539m of losartan at night. Patient is taking 5087mosartan bid hydralazine 10 mg BID  Blood pressure readings at home 110's-140's/60's-80's. Cardiac symptoms none. Patient denies chest pain, chest pressure/discomfort, fatigue, irregular heart beat, lower extremity edema, and near-syncope.    BP Readings from Last 3 Encounters:  08/28/22 (!) 191/84  07/31/22 (!) 170/88  07/24/22 (!) 186/91   Wt Readings from Last 3 Encounters:  08/28/22 105 lb 4.8 oz (47.8 kg)  07/31/22 101 lb 9.6 oz (46.1 kg)  07/24/22 100 lb (45.4 kg)     Medications: Outpatient Medications Prior to Visit  Medication Sig   alendronate (FOSAMAX) 70 MG tablet Take 1 tablet (70 mg total) by mouth every 7 (seven) days. Take with a full glass of water on an empty stomach.   aspirin 81 MG tablet Take 81 mg at bedtime by mouth.    Blood Pressure Monitoring (BLOOD PRESSURE KIT) DEVI Check BP twice a day in morning and evening   hydrALAZINE (APRESOLINE) 10 MG tablet TAKE 1 TABLET BY MOUTH TWICE A DAY   Polyvinyl Alcohol-Povidone (REFRESH OP) Place 1 drop 5 (five) times daily as needed into both eyes (DRY EYES).   rosuvastatin (CRESTOR) 5 MG tablet Take 1 tablet (5 mg total) by mouth daily.   Vitamin D, Cholecalciferol,  1000 UNITS TABS Take 1 tablet by mouth daily.   [DISCONTINUED] losartan (COZAAR) 50 MG tablet Take 1 tablet (50 mg total) by mouth 2 (two) times daily.   [DISCONTINUED] losartan (COZAAR) 50 MG tablet TAKE 1 1/2 TABLET IN THE MORNING AND 1 TABLET IN THE EVENING   No facility-administered medications prior to visit.    Review of Systems     Objective    BP (!) 191/84 (BP Location: Right Arm, Patient Position: Sitting, Cuff Size: Normal)   Pulse 73   Temp (!) 97.2 F (36.2 C) (Oral)   Resp 16   Wt 105 lb 4.8 oz (47.8 kg)   SpO2 100%   BMI 18.65 kg/m    Physical Exam Vitals reviewed.  Constitutional:      General: She is not in acute distress.    Appearance: Normal appearance. She is not ill-appearing, toxic-appearing or diaphoretic.  Eyes:     Conjunctiva/sclera: Conjunctivae normal.  Cardiovascular:     Rate and Rhythm: Normal rate and regular rhythm.     Pulses: Normal pulses.     Heart sounds: Normal heart sounds. No murmur heard.    No friction rub. No gallop.  Pulmonary:     Effort: Pulmonary effort is normal. No respiratory distress.     Breath sounds: Normal breath sounds. No stridor. No wheezing, rhonchi or rales.  Abdominal:     General: Bowel sounds are normal. There is no distension.     Palpations: Abdomen is soft.  Tenderness: There is no abdominal tenderness.  Musculoskeletal:     Right lower leg: No edema.     Left lower leg: No edema.  Skin:    Findings: No erythema or rash.  Neurological:     Mental Status: She is alert and oriented to person, place, and time.       No results found for any visits on 08/28/22.  Assessment & Plan     Problem List Items Addressed This Visit       Cardiovascular and Mediastinum   Hypertension - Primary    Chronic, elevated today but overall controlled based on home measurements  Noted hx of elevated in office measurements and white coat HTN  Patient is asymptomatic with elevated repeat measurements   continue Losartan 100m twice daily, instead of 1.5 tabs for 752m continue hydralazine 1044mwice daily   F/u scheduled for  12/31/22       Relevant Medications   losartan (COZAAR) 50 MG tablet     Return in about 21 weeks (around 01/22/2023) for AWV.      I, MakEulis FosterD, have reviewed all documentation for this visit.  Portions of this information were initially documented by the CMA and reviewed by me for thoroughness and accuracy.      MakEulis FosterD  [facility] [phone]) [phone])  [place]

## 2022-08-28 ENCOUNTER — Other Ambulatory Visit: Payer: Self-pay | Admitting: Family Medicine

## 2022-08-28 ENCOUNTER — Ambulatory Visit (INDEPENDENT_AMBULATORY_CARE_PROVIDER_SITE_OTHER): Payer: Medicare HMO | Admitting: Family Medicine

## 2022-08-28 ENCOUNTER — Encounter: Payer: Self-pay | Admitting: Family Medicine

## 2022-08-28 VITALS — BP 191/84 | HR 73 | Temp 97.2°F | Resp 16 | Wt 105.3 lb

## 2022-08-28 DIAGNOSIS — I10 Essential (primary) hypertension: Secondary | ICD-10-CM

## 2022-08-28 MED ORDER — LOSARTAN POTASSIUM 50 MG PO TABS: 50.0000 mg | ORAL_TABLET | Freq: Two times a day (BID) | ORAL | 1 refills | Status: DC

## 2022-08-28 NOTE — Patient Instructions (Signed)
We will continue your medication for your blood pressure including  - Losartan '50mg'$  twice daily  - hydralazine '10mg'$  twice daily   Please continue your other medications as prescribed.   Your next appt is scheduled for 12/31/22 at 2:00 PM with Dr. Quentin Cornwall for your annual wellness visit, .

## 2022-09-03 NOTE — Assessment & Plan Note (Addendum)
Chronic, elevated today but overall controlled based on home measurements  Noted hx of elevated in office measurements and white coat HTN  Patient is asymptomatic with elevated repeat measurements  continue Losartan '50mg'$  twice daily, instead of 1.5 tabs for '75mg'$   continue hydralazine '10mg'$  twice daily   F/u scheduled for  12/31/22

## 2022-09-13 ENCOUNTER — Other Ambulatory Visit: Payer: Self-pay | Admitting: Family Medicine

## 2022-09-13 DIAGNOSIS — Z1231 Encounter for screening mammogram for malignant neoplasm of breast: Secondary | ICD-10-CM

## 2022-09-19 ENCOUNTER — Ambulatory Visit (INDEPENDENT_AMBULATORY_CARE_PROVIDER_SITE_OTHER): Payer: Medicare HMO | Admitting: Family Medicine

## 2022-09-19 ENCOUNTER — Encounter: Payer: Self-pay | Admitting: Family Medicine

## 2022-09-19 ENCOUNTER — Ambulatory Visit: Payer: Self-pay

## 2022-09-19 VITALS — BP 132/62 | HR 97 | Temp 97.9°F | Wt 103.0 lb

## 2022-09-19 DIAGNOSIS — A084 Viral intestinal infection, unspecified: Secondary | ICD-10-CM | POA: Insufficient documentation

## 2022-09-19 DIAGNOSIS — R21 Rash and other nonspecific skin eruption: Secondary | ICD-10-CM | POA: Insufficient documentation

## 2022-09-19 DIAGNOSIS — I10 Essential (primary) hypertension: Secondary | ICD-10-CM | POA: Diagnosis not present

## 2022-09-19 MED ORDER — NYSTATIN-TRIAMCINOLONE 100000-0.1 UNIT/GM-% EX OINT: 1.0000 | TOPICAL_OINTMENT | Freq: Two times a day (BID) | CUTANEOUS | 0 refills | Status: DC

## 2022-09-19 MED ORDER — PROCHLORPERAZINE MALEATE 5 MG PO TABS: 5.0000 mg | ORAL_TABLET | Freq: Four times a day (QID) | ORAL | 0 refills | Status: DC | PRN

## 2022-09-19 NOTE — Telephone Encounter (Signed)
  Chief Complaint: medication reaction  Symptoms: rash on R side of nose and nausea and stomach discomfort  Frequency: 1 week  Pertinent Negatives: NA Disposition: '[]'$ ED /'[]'$ Urgent Care (no appt availability in office) / '[x]'$ Appointment(In office/virtual)/ '[]'$  [facility]/ '[]'$ Home Care/ '[]'$ Refused Recommended Disposition /'[]'$ [facility]/ '[x]'$  Follow-up with PCP Additional Notes: pt states started n hydralazine 08/05/22 and has been doing fine until 1 week ago. Started with rash on 09/17/22 and pt read SE from brochure from pharmacy and unsure what to do. Has had nausea and stomach discomfort as well. Advised pt to stop medication for now until FU with provider. Pt has appt this afternoon at 1600 with Daneil Dan, NP. Advised pt I would send message back but to still attend appt. Pt verbalized understanding.   Summary: rx concern / stomach discsomfort   The patient shares that they have experienced a rash on the right side of their nose since 09/17/22  The patient has no difficulty breathing through their nose  The patient believes they may be experiencing  a reaction to hydrALAZINE (APRESOLINE) 10 MG tablet [[id number]]  The patient has also experienced nausea and stomach discomfort  The patient would like to speak with a member of clinical staff when possible       Reason for Disposition  [1] Caller has URGENT medicine question about med that PCP or specialist prescribed AND [2] triager unable to answer question  Answer Assessment - Initial Assessment Questions 1. NAME of MEDICINE: "What medicine(s) are you calling about?"     hydralazine 2. QUESTION: "What is your question?" (e.g., double dose of medicine, side effect)     Thinking having reaction  3. PRESCRIBER: "Who prescribed the medicine?" Reason: if prescribed by specialist, call should be referred to that group.     Dr. Quentin Cornwall  4. SYMPTOMS: "Do you have any symptoms?" If Yes, ask: "What symptoms are you having?"  "How  bad are the symptoms (e.g., mild, moderate, severe)    This week started Rash on R side of nose, nausea and stomach discomfort at night and this morning  Protocols used: Medication Question Call-A-AH

## 2022-09-19 NOTE — Assessment & Plan Note (Signed)
Acute x1 episode; recommend BRAT diet and PO fluids. Pt has been able to eat and drink today. Low dose compazine to assist

## 2022-09-19 NOTE — Assessment & Plan Note (Signed)
Small 2 mm lesion on R nasal fold; no concerning for drug rash given rash x3 days and on new BP medication, hydralazine, since 08/05/22 Topical cream to assist; encourage warm compresses to open pores

## 2022-09-19 NOTE — Progress Notes (Signed)
I,Connie R Striblin,acting as a Education administrator for Gwyneth Sprout, FNP.,have documented all relevant documentation on the behalf of Gwyneth Sprout, FNP,as directed by  Gwyneth Sprout, FNP while in the presence of Gwyneth Sprout, FNP.   Established patient visit   Patient: Morgan Ponce   DOB: 1941-02-11   81 y.o. Female  MRN: [id number] Visit Date: 09/19/2022  Today's healthcare provider: Gwyneth Sprout, FNP   Subjective    HPI  Skin Irritation: Patient complains of skin irritation . The irritation is located on the  nose . Irritation  has been present 3 days. Pain is rated 0/10.  Pt also complains of nausea and diarrhea since last night and she believes that it is related. X1 episode; no sick contacts or known exposures     Medications: Outpatient Medications Prior to Visit  Medication Sig   alendronate (FOSAMAX) 70 MG tablet Take 1 tablet (70 mg total) by mouth every 7 (seven) days. Take with a full glass of water on an empty stomach.   aspirin 81 MG tablet Take 81 mg at bedtime by mouth.    Blood Pressure Monitoring (BLOOD PRESSURE KIT) DEVI Check BP twice a day in morning and evening   losartan (COZAAR) 50 MG tablet Take 1 tablet (50 mg total) by mouth in the morning and at bedtime.   Polyvinyl Alcohol-Povidone (REFRESH OP) Place 1 drop 5 (five) times daily as needed into both eyes (DRY EYES).   rosuvastatin (CRESTOR) 5 MG tablet Take 1 tablet (5 mg total) by mouth daily.   Vitamin D, Cholecalciferol, 1000 UNITS TABS Take 1 tablet by mouth daily.   hydrALAZINE (APRESOLINE) 10 MG tablet TAKE 1 TABLET BY MOUTH TWICE A DAY (Patient not taking: Reported on 09/19/2022)   No facility-administered medications prior to visit.    Review of Systems    Objective    BP 132/62 (BP Location: Right Arm, Patient Position: Sitting, Cuff Size: Normal)   Pulse 97   Temp 97.9 F (36.6 C) (Oral)   Wt 103 lb (46.7 kg)   SpO2 100%   BMI 18.25 kg/m   Physical Exam Vitals and nursing note reviewed.   Constitutional:      General: She is not in acute distress.    Appearance: Normal appearance. She is underweight. She is not ill-appearing, toxic-appearing or diaphoretic.  HENT:     Head: Normocephalic and atraumatic.      Comments: Not to scale Cardiovascular:     Rate and Rhythm: Normal rate and regular rhythm.     Pulses: Normal pulses.     Heart sounds: Normal heart sounds. No murmur heard.    No friction rub. No gallop.  Pulmonary:     Effort: Pulmonary effort is normal. No respiratory distress.     Breath sounds: Normal breath sounds. No stridor. No wheezing, rhonchi or rales.  Chest:     Chest wall: No tenderness.  Abdominal:     General: Bowel sounds are normal. There is no distension.     Palpations: Abdomen is soft.     Tenderness: There is no abdominal tenderness. There is no guarding or rebound.  Musculoskeletal:        General: No swelling, tenderness, deformity or signs of injury. Normal range of motion.     Right lower leg: No edema.     Left lower leg: No edema.  Skin:    General: Skin is warm and dry.     Capillary  Refill: Capillary refill takes less than 2 seconds.     Coloration: Skin is not jaundiced or pale.     Findings: No bruising, erythema, lesion or rash.  Neurological:     General: No focal deficit present.     Mental Status: She is alert and oriented to person, place, and time. Mental status is at baseline.     Cranial Nerves: No cranial nerve deficit.     Sensory: No sensory deficit.     Motor: No weakness.     Coordination: Coordination normal.  Psychiatric:        Mood and Affect: Mood normal.        Behavior: Behavior normal.        Thought Content: Thought content normal.        Judgment: Judgment normal.     No results found for any visits on 09/19/22.  Assessment & Plan     Problem List Items Addressed This Visit       Cardiovascular and Mediastinum   Hypertension    Reassured that this current concern is not a side effect  from her hydralazine; ok to restart 10 mg BID in combination with 50 mg BID losartan At goal today <140/<90        Digestive   Viral gastroenteritis - Primary    Acute x1 episode; recommend BRAT diet and PO fluids. Pt has been able to eat and drink today. Low dose compazine to assist        Relevant Medications   nystatin-triamcinolone ointment (MYCOLOG)   prochlorperazine (COMPAZINE) 5 MG tablet     Musculoskeletal and Integument   Facial rash    Small 2 mm lesion on R nasal fold; no concerning for drug rash given rash x3 days and on new BP medication, hydralazine, since 08/05/22 Topical cream to assist; encourage warm compresses to open pores       Relevant Medications   nystatin-triamcinolone ointment (MYCOLOG)   Return if symptoms worsen or fail to improve.     Vonna Kotyk, FNP, have reviewed all documentation for this visit. The documentation on 09/19/22 for the exam, diagnosis, procedures, and orders are all accurate and complete.  Gwyneth Sprout, [place] [phone] (phone) [phone] (fax)  [place]

## 2022-09-19 NOTE — Assessment & Plan Note (Signed)
Reassured that this current concern is not a side effect from her hydralazine; ok to restart 10 mg BID in combination with 50 mg BID losartan At goal today <140/<90

## 2022-11-19 ENCOUNTER — Telehealth: Payer: Self-pay | Admitting: Family Medicine

## 2022-11-19 NOTE — Telephone Encounter (Signed)
Contacted Morgan Ponce to schedule their annual wellness visit. Appointment made for 12/30/2022.  [place] Direct Dial: [phone]

## 2022-12-06 ENCOUNTER — Other Ambulatory Visit: Payer: Self-pay | Admitting: Family Medicine

## 2022-12-09 ENCOUNTER — Other Ambulatory Visit: Payer: Self-pay | Admitting: Family Medicine

## 2022-12-09 NOTE — Telephone Encounter (Signed)
Medication Refill - Medication: alendronate (FOSAMAX) 70 MG tablet [id number]   Has the patient contacted their pharmacy? Yes.   (Agent: If no, request that the patient contact the pharmacy for the refill. If patient does not wish to contact the pharmacy document the reason why and proceed with request.) (Agent: If yes, when and what did the pharmacy advise?)  Preferred Pharmacy (with phone number or street name):  [id number] [place] [place]: [phone] Fax: [phone]  Has the patient been seen for an appointment in the last year OR does the patient have an upcoming appointment? Yes.    Agent: Please be advised that RX refills may take up to 3 business days. We ask that you follow-up with your pharmacy.

## 2022-12-10 MED ORDER — ALENDRONATE SODIUM 70 MG PO TABS: 70.0000 mg | ORAL_TABLET | ORAL | 0 refills | Status: DC

## 2022-12-10 NOTE — Telephone Encounter (Signed)
Appointment 12/31/22 Requested Prescriptions  Pending Prescriptions Disp Refills   alendronate (FOSAMAX) 70 MG tablet 4 tablet 11    Sig: Take 1 tablet (70 mg total) by mouth every 7 (seven) days. Take with a full glass of water on an empty stomach.     Endocrinology:  Bisphosphonates Failed - 12/09/2022  3:27 PM      Failed - Vitamin D in normal range and within 360 days    Vit D, 25-Hydroxy  Date Value Ref Range Status  03/08/2019 45.0 30.0 - 100.0 ng/mL Final    Comment:    Vitamin D deficiency has been defined by the [place] practice guideline as a level of serum 25-OH vitamin D less than 20 ng/mL (1,2). The Endocrine Society went on to further define vitamin D insufficiency as a level between 21 and 29 ng/mL (2). 1. IOM (Institute of Medicine). 2010. Dietary reference    intakes for calcium and D. [place]: The    [employer]. 2. Holick MF, Binkley [place], Bischoff-Ferrari HA, et al.    Evaluation, treatment, and prevention of vitamin D    deficiency: an Endocrine Society clinical practice    guideline. JCEM. 2011 Jul; 96(7):1911-30.          Failed - Mg Level in normal range and within 360 days    No results found for: "MG"       Failed - Phosphate in normal range and within 360 days    Phosphorus  Date Value Ref Range Status  04/21/2017 3.6 2.5 - 4.5 mg/dL Final         Passed - Ca in normal range and within 360 days    Calcium  Date Value Ref Range Status  07/01/2022 9.3 8.7 - 10.3 mg/dL Final   Calcium, Total  Date Value Ref Range Status  02/12/2012 7.2 (L) 8.5 - 10.1 mg/dL Final         Passed - Cr in normal range and within 360 days    Creatinine  Date Value Ref Range Status  02/12/2012 0.87 0.60 - 1.30 mg/dL Final   Creatinine, Ser  Date Value Ref Range Status  07/01/2022 0.97 0.57 - 1.00 mg/dL Final         Passed - eGFR is 30 or above and within 360 days    EGFR (African American)  Date Value  Ref Range Status  02/12/2012 >60  Final   GFR calc Af Amer  Date Value Ref Range Status  11/16/2019 52 (L) >59 mL/min/1.73 Final   EGFR (Non-African Amer.)  Date Value Ref Range Status  02/12/2012 >60  Final    Comment:    eGFR values <72m/min/1.73 m2 may be an indication of chronic kidney disease (CKD). Calculated eGFR is useful in patients with stable renal function. The eGFR calculation will not be reliable in acutely ill patients when serum creatinine is changing rapidly. It is not useful in  patients on dialysis. The eGFR calculation may not be applicable to patients at the low and high extremes of body sizes, pregnant women, and vegetarians.    GFR calc non Af Amer  Date Value Ref Range Status  11/16/2019 46 (L) >59 mL/min/1.73 Final   eGFR  Date Value Ref Range Status  07/01/2022 58 (L) >59 mL/min/1.73 Final         Passed - Valid encounter within last 12 months    Recent Outpatient Visits  2 months ago Viral gastroenteritis   [place] Joe T, FNP   3 months ago Primary hypertension   [place] Simmons-Robinson, [place], MD   4 months ago Primary hypertension   [place], [place], MD   5 months ago Primary hypertension   [place], [place], MD   9 months ago Screening for tuberculosis   [place], MD       Future Appointments             In 3 weeks Simmons-Robinson, Riki Sheer, MD [facility]            Passed - Bone Mineral Density or Dexa Scan completed in the last 2 years

## 2022-12-24 DIAGNOSIS — L570 Actinic keratosis: Secondary | ICD-10-CM | POA: Diagnosis not present

## 2022-12-24 DIAGNOSIS — D2271 Melanocytic nevi of right lower limb, including hip: Secondary | ICD-10-CM | POA: Diagnosis not present

## 2022-12-24 DIAGNOSIS — Z85828 Personal history of other malignant neoplasm of skin: Secondary | ICD-10-CM | POA: Diagnosis not present

## 2022-12-24 DIAGNOSIS — L821 Other seborrheic keratosis: Secondary | ICD-10-CM | POA: Diagnosis not present

## 2022-12-24 DIAGNOSIS — D2261 Melanocytic nevi of right upper limb, including shoulder: Secondary | ICD-10-CM | POA: Diagnosis not present

## 2022-12-24 DIAGNOSIS — D2262 Melanocytic nevi of left upper limb, including shoulder: Secondary | ICD-10-CM | POA: Diagnosis not present

## 2022-12-24 DIAGNOSIS — M71341 Other bursal cyst, right hand: Secondary | ICD-10-CM | POA: Diagnosis not present

## 2022-12-24 DIAGNOSIS — D225 Melanocytic nevi of trunk: Secondary | ICD-10-CM | POA: Diagnosis not present

## 2022-12-26 ENCOUNTER — Encounter: Payer: Medicare HMO | Admitting: Family Medicine

## 2022-12-26 ENCOUNTER — Ambulatory Visit
Admission: RE | Admit: 2022-12-26 | Discharge: 2022-12-26 | Disposition: A | Discharge status: Home / Self Care | Payer: Medicare HMO | Source: Ambulatory Visit | Attending: Family Medicine | Admitting: Family Medicine

## 2022-12-26 DIAGNOSIS — Z1231 Encounter for screening mammogram for malignant neoplasm of breast: Secondary | ICD-10-CM | POA: Insufficient documentation

## 2022-12-30 ENCOUNTER — Ambulatory Visit (INDEPENDENT_AMBULATORY_CARE_PROVIDER_SITE_OTHER): Payer: Medicare HMO

## 2022-12-30 VITALS — Ht 63.0 in | Wt 103.0 lb

## 2022-12-30 DIAGNOSIS — Z0000 Encounter for general adult medical examination without abnormal findings: Secondary | ICD-10-CM | POA: Diagnosis not present

## 2022-12-30 NOTE — Patient Instructions (Signed)
Morgan Ponce , Thank you for taking time to come for your Medicare Wellness Visit. I appreciate your ongoing commitment to your health goals. Please review the following plan we discussed and let me know if I can assist you in the future.   These are the goals we discussed:  Goals      DIET - INCREASE WATER INTAKE     Recommend increasing water intake to 6-8 8 oz glasses a day.         This is a list of the screening recommended for you and due dates:  Health Maintenance  Topic Date Due   Zoster (Shingles) Vaccine (1 of 2) Never done   DTaP/Tdap/Td vaccine (2 - Td or Tdap) 05/26/2021   COVID-19 Vaccine (5 - 2023-24 season) 10/07/2022   Flu Shot  05/01/2023   Medicare Annual Wellness Visit  12/30/2023   DEXA scan (bone density measurement)  11/05/2026   Pneumonia Vaccine  Completed   HPV Vaccine  Aged Out    Advanced directives: yes  Conditions/risks identified: low falls risk  Next appointment: Follow up in one year for your annual wellness visit 01/05/2024 @3 :30pm telephone   Preventive Care 31 Years and Older, Female Preventive care refers to lifestyle choices and visits with your health care provider that can promote health and wellness. What does preventive care include? A yearly physical exam. This is also called an annual well check. Dental exams once or twice a year. Routine eye exams. Ask your health care provider how often you should have your eyes checked. Personal lifestyle choices, including: Daily care of your teeth and gums. Regular physical activity. Eating a healthy diet. Avoiding tobacco and drug use. Limiting alcohol use. Practicing safe sex. Taking low-dose aspirin every day. Taking vitamin and mineral supplements as recommended by your health care provider. What happens during an annual well check? The services and screenings done by your health care provider during your annual well check will depend on your age, overall health, lifestyle risk  factors, and family history of disease. Counseling  Your health care provider may ask you questions about your: Alcohol use. Tobacco use. Drug use. Emotional well-being. Home and relationship well-being. Sexual activity. Eating habits. History of falls. Memory and ability to understand (cognition). Work and work Statistician. Reproductive health. Screening  You may have the following tests or measurements: Height, weight, and BMI. Blood pressure. Lipid and cholesterol levels. These may be checked every 5 years, or more frequently if you are over 76 years old. Skin check. Lung cancer screening. You may have this screening every year starting at age 70 if you have a 30-pack-year history of smoking and currently smoke or have quit within the past 15 years. Fecal occult blood test (FOBT) of the stool. You may have this test every year starting at age 61. Flexible sigmoidoscopy or colonoscopy. You may have a sigmoidoscopy every 5 years or a colonoscopy every 10 years starting at age 106. Hepatitis C blood test. Hepatitis B blood test. Sexually transmitted disease (STD) testing. Diabetes screening. This is done by checking your blood sugar (glucose) after you have not eaten for a while (fasting). You may have this done every 1-3 years. Bone density scan. This is done to screen for osteoporosis. You may have this done starting at age 29. Mammogram. This may be done every 1-2 years. Talk to your health care provider about how often you should have regular mammograms. Talk with your health care provider about your test results, treatment  options, and if necessary, the need for more tests. Vaccines  Your health care provider may recommend certain vaccines, such as: Influenza vaccine. This is recommended every year. Tetanus, diphtheria, and acellular pertussis (Tdap, Td) vaccine. You may need a Td booster every 10 years. Zoster vaccine. You may need this after age 52. Pneumococcal 13-valent  conjugate (PCV13) vaccine. One dose is recommended after age 58. Pneumococcal polysaccharide (PPSV23) vaccine. One dose is recommended after age 31. Talk to your health care provider about which screenings and vaccines you need and how often you need them. This information is not intended to replace advice given to you by your health care provider. Make sure you discuss any questions you have with your health care provider. Document Released: 10/13/2015 Document Revised: 06/05/2016 Document Reviewed: 07/18/2015 Elsevier Interactive Patient Education  2017 [place] Prevention in the Home Falls can cause injuries. They can happen to people of all ages. There are many things you can do to make your home safe and to help prevent falls. What can I do on the outside of my home? Regularly fix the edges of walkways and driveways and fix any cracks. Remove anything that might make you trip as you walk through a door, such as a raised step or threshold. Trim any bushes or trees on the path to your home. Use bright outdoor lighting. Clear any walking paths of anything that might make someone trip, such as rocks or tools. Regularly check to see if handrails are loose or broken. Make sure that both sides of any steps have handrails. Any raised decks and porches should have guardrails on the edges. Have any leaves, snow, or ice cleared regularly. Use sand or salt on walking paths during winter. Clean up any spills in your garage right away. This includes oil or grease spills. What can I do in the bathroom? Use night lights. Install grab bars by the toilet and in the tub and shower. Do not use towel bars as grab bars. Use non-skid mats or decals in the tub or shower. If you need to sit down in the shower, use a plastic, non-slip stool. Keep the floor dry. Clean up any water that spills on the floor as soon as it happens. Remove soap buildup in the tub or shower regularly. Attach bath mats  securely with double-sided non-slip rug tape. Do not have throw rugs and other things on the floor that can make you trip. What can I do in the bedroom? Use night lights. Make sure that you have a light by your bed that is easy to reach. Do not use any sheets or blankets that are too big for your bed. They should not hang down onto the floor. Have a firm chair that has side arms. You can use this for support while you get dressed. Do not have throw rugs and other things on the floor that can make you trip. What can I do in the kitchen? Clean up any spills right away. Avoid walking on wet floors. Keep items that you use a lot in easy-to-reach places. If you need to reach something above you, use a strong step stool that has a grab bar. Keep electrical cords out of the way. Do not use floor polish or wax that makes floors slippery. If you must use wax, use non-skid floor wax. Do not have throw rugs and other things on the floor that can make you trip. What can I do with my stairs? Do not  leave any items on the stairs. Make sure that there are handrails on both sides of the stairs and use them. Fix handrails that are broken or loose. Make sure that handrails are as long as the stairways. Check any carpeting to make sure that it is firmly attached to the stairs. Fix any carpet that is loose or worn. Avoid having throw rugs at the top or bottom of the stairs. If you do have throw rugs, attach them to the floor with carpet tape. Make sure that you have a light switch at the top of the stairs and the bottom of the stairs. If you do not have them, ask someone to add them for you. What else can I do to help prevent falls? Wear shoes that: Do not have high heels. Have rubber bottoms. Are comfortable and fit you well. Are closed at the toe. Do not wear sandals. If you use a stepladder: Make sure that it is fully opened. Do not climb a closed stepladder. Make sure that both sides of the stepladder  are locked into place. Ask someone to hold it for you, if possible. Clearly mark and make sure that you can see: Any grab bars or handrails. First and last steps. Where the edge of each step is. Use tools that help you move around (mobility aids) if they are needed. These include: Canes. Walkers. Scooters. Crutches. Turn on the lights when you go into a dark area. Replace any light bulbs as soon as they burn out. Set up your furniture so you have a clear path. Avoid moving your furniture around. If any of your floors are uneven, fix them. If there are any pets around you, be aware of where they are. Review your medicines with your doctor. Some medicines can make you feel dizzy. This can increase your chance of falling. Ask your doctor what other things that you can do to help prevent falls. This information is not intended to replace advice given to you by your health care provider. Make sure you discuss any questions you have with your health care provider. Document Released: 07/13/2009 Document Revised: 02/22/2016 Document Reviewed: 10/21/2014 Elsevier Interactive Patient Education  2017 [employer].

## 2022-12-30 NOTE — Progress Notes (Signed)
I connected with  [place] on 12/30/22 by a audio enabled telemedicine application and verified that I am speaking with the correct person using two identifiers.  Patient Location: Home  Provider Location: Office/Clinic  I discussed the limitations of evaluation and management by telemedicine. The patient expressed understanding and agreed to proceed.  Subjective:   Morgan Ponce is a 82 y.o. female who presents for Medicare Annual (Subsequent) preventive examination.  Review of Systems    Cardiac Risk Factors include: advanced age (>52men, >21 women);dyslipidemia;hypertension     Objective:    Today's Vitals   12/30/22 1557  Weight: 103 lb (46.7 kg)  Height: 5\' 3"  (1.6 m)   Body mass index is 18.25 kg/m.     12/30/2022    4:07 PM 07/24/2022   10:26 AM 12/18/2020    9:21 AM 11/16/2019    9:54 AM 11/10/2018   10:07 AM 09/15/2017    1:29 PM 09/02/2017   11:25 AM  Advanced Directives  Does Patient Have a Medical Advance Directive? Yes No Yes Yes Yes Yes Yes  Type of Scientist, physiological of [place];Living will [place];Living will [place];Living will [place];Living will [place];Living will  Copy of [place] in Chart?   Yes - validated most recent copy scanned in chart (See row information) No - copy requested No - copy requested No - copy requested No - copy requested  Would patient like information on creating a medical advance directive?  No - Patient declined         Current Medications (verified) Outpatient Encounter Medications as of 12/30/2022  Medication Sig   alendronate (FOSAMAX) 70 MG tablet Take 1 tablet (70 mg total) by mouth every 7 (seven) days. Take with a full glass of water on an empty stomach.   aspirin 81 MG tablet Take 81 mg at bedtime by mouth.    Blood Pressure Monitoring (BLOOD PRESSURE KIT) DEVI Check BP twice a day in morning and  evening   hydrALAZINE (APRESOLINE) 10 MG tablet TAKE 1 TABLET BY MOUTH TWICE A DAY   losartan (COZAAR) 50 MG tablet Take 1 tablet (50 mg total) by mouth in the morning and at bedtime.   nystatin-triamcinolone ointment (MYCOLOG) Apply 1 Application topically 2 (two) times daily.   Polyvinyl Alcohol-Povidone (REFRESH OP) Place 1 drop 5 (five) times daily as needed into both eyes (DRY EYES).   rosuvastatin (CRESTOR) 5 MG tablet TAKE 1 TABLET (5 MG TOTAL) BY MOUTH DAILY.   Vitamin D, Cholecalciferol, 1000 UNITS TABS Take 1 tablet by mouth daily.   prochlorperazine (COMPAZINE) 5 MG tablet Take 1 tablet (5 mg total) by mouth every 6 (six) hours as needed for nausea or vomiting. (Patient not taking: Reported on 12/30/2022)   No facility-administered encounter medications on file as of 12/30/2022.    Allergies (verified) Cardizem [diltiazem] and Diltiazem hcl   History: Past Medical History:  Diagnosis Date   Arthritis    Dysrhythmia    HOH (hard of hearing)    AIDS   Hyperlipidemia    Hypertension    RESTARTED BP MED SINCE FIRST CATARACT   Pain    RIGHT KNEE   Skin cancer of forehead    Past Surgical History:  Procedure Laterality Date   BREAST BIOPSY Right 08/22/2014   benign. Done by Dr Collene Schlichter   BREAST EXCISIONAL BIOPSY Right 1972   benign   CATARACT  EXTRACTION W/PHACO Right 05/06/2017   Procedure: CATARACT EXTRACTION PHACO AND INTRAOCULAR LENS PLACEMENT (IOC);  Surgeon: Birder Robson, MD;  Location: [facility] ORS;  Service: Ophthalmology;  Laterality: Right;  [place] 00:40 AP% 17.6 CDE 7.16 Fluid pack lot # [id number]   CATARACT EXTRACTION W/PHACO Left 09/02/2017   Procedure: CATARACT EXTRACTION PHACO AND INTRAOCULAR LENS PLACEMENT (IOC);  Surgeon: Birder Robson, MD;  Location: [facility] ORS;  Service: Ophthalmology;  Laterality: Left;  [place] 00:39 AP% 15.0 CDE 5.91 Fluid pack lot # [id number]   CHOLECYSTECTOMY  2013   COLONOSCOPY N/A 02/06/2015   Procedure: COLONOSCOPY;  Surgeon: Hulen Luster, MD;   Location: [facility];  Service: Gastroenterology;  Laterality: N/A;   HYSTEROSCOPY  1999   Family History  Problem Relation Age of Onset   Hypertension Mother    Stroke Mother    Heart disease Father    Hypertension Father    Hypertension Sister    Breast cancer Sister 75   Heart attack Brother    Social History   Socioeconomic History   Marital status: Widowed    Spouse name: Not on file   Number of children: 0   Years of education: Not on file   Highest education level: High school graduate  Occupational History   Occupation: retired  Tobacco Use   Smoking status: Never   Smokeless tobacco: Never  Vaping Use   Vaping Use: Never used  Substance and Sexual Activity   Alcohol use: No    Alcohol/week: 0.0 standard drinks of alcohol   Drug use: No   Sexual activity: Never  Other Topics Concern   Not on file  Social History Narrative   Not on file   Social Determinants of Health   Financial Resource Strain: Low Risk  (12/30/2022)   Overall Financial Resource Strain (CARDIA)    Difficulty of Paying Living Expenses: Not hard at all  Food Insecurity: No Food Insecurity (12/30/2022)   Hunger Vital Sign    Worried About Running Out of Food in the Last Year: Never true    [place] in the Last Year: Never true  Transportation Needs: No Transportation Needs (12/30/2022)   PRAPARE - Hydrologist (Medical): No    Lack of Transportation (Non-Medical): No  Physical Activity: Insufficiently Active (12/30/2022)   Exercise Vital Sign    Days of Exercise per Week: 2 days    Minutes of Exercise per Session: 20 min  Stress: No Stress Concern Present (12/30/2022)   [place]    Feeling of Stress : Not at all  Social Connections: Moderately Isolated (12/30/2022)   Social Connection and Isolation Panel [NHANES]    Frequency of Communication with Friends and Family: More than three times a  week    Frequency of Social Gatherings with Friends and Family: More than three times a week    Attends Religious Services: More than 4 times per year    Active Member of [employer] or Organizations: No    Attends Archivist Meetings: Never    Marital Status: Widowed    Tobacco Counseling Counseling given: Not Answered   Clinical Intake:  Pre-visit preparation completed: Yes  Pain : No/denies pain     BMI - recorded: 18.25 Nutritional Status: BMI <19  Underweight Nutritional Risks: None Diabetes: No  How often do you need to have someone help you when you read instructions, pamphlets, or other written materials from your  doctor or pharmacy?: 1 - Never  Diabetic?no  Interpreter Needed?: No  Comments: lives in retirement center Information entered by :: B.Ivette Castronova,LPN   Activities of Daily Living    12/30/2022    4:07 PM 08/28/2022    2:50 PM  In your present state of health, do you have any difficulty performing the following activities:  Hearing? 1 1  Comment hearing aids   Vision? 0 1  Difficulty concentrating or making decisions? 0 0  Walking or climbing stairs? 1 1  Dressing or bathing? 0   Doing errands, shopping? 0 0  Preparing Food and eating ? N   Using the Toilet? N   In the past six months, have you accidently leaked urine? N   Do you have problems with loss of bowel control? N   Managing your Medications? N   Managing your Finances? N   Housekeeping or managing your Housekeeping? N     Patient Care Team: [name], MD as PCP - General (Family Medicine) Birder Robson, MD as Referring Physician (Ophthalmology) Dasher, Rayvon Char, MD (Dermatology) Throat, [place] Ear Nose And  Indicate any recent Medical Services you may have received from other than [facility] providers in the past year (date may be approximate).     Assessment:   This is a routine wellness examination for [place].  Hearing/Vision screen Hearing Screening -  Comments:: Adequate hearing w/heARING AIDES Vision Screening - Comments:: Adequate Eye Dr Carolan Clines  Dietary issues and exercise activities discussed: Current Exercise Habits: Home exercise routine, Type of exercise: walking, Time (Minutes): 20, Frequency (Times/Week): 3, Weekly Exercise (Minutes/Week): 60, Intensity: Mild, Exercise limited by: None identified   Goals Addressed   None    Depression Screen    12/30/2022    4:05 PM 08/28/2022    2:50 PM 07/01/2022   10:05 AM 12/24/2021    1:52 PM 09/19/2021   10:39 AM 03/20/2021   11:36 AM 12/18/2020    9:22 AM  PHQ 2/9 Scores  PHQ - 2 Score 0 0 0 0 0 0 0  PHQ- 9 Score  0  0 0 0     Fall Risk    12/30/2022    4:01 PM 08/28/2022    2:49 PM 07/31/2022    1:55 PM 07/01/2022   10:05 AM 12/24/2021    1:52 PM  Fall Risk   Falls in the past year? 0 1 1 1  0  Number falls in past yr: 0 0 0 1 0  Injury with Fall? 0 1 0 0 0  Risk for fall due to : No Fall Risks No Fall Risks No Fall Risks Other (Comment) No Fall Risks  Follow up Education provided;Falls prevention discussed    Falls evaluation completed    FALL RISK PREVENTION PERTAINING TO THE HOME:  Any stairs in or around the home? No  If so, are there any without handrails? No  Home free of loose throw rugs in walkways, pet beds, electrical cords, etc? Yes  Adequate lighting in your home to reduce risk of falls? Yes   ASSISTIVE DEVICES UTILIZED TO PREVENT FALLS:  Life alert? Yes  Use of a cane, walker or w/c? No  Grab bars in the bathroom? Yes  Shower chair or bench in shower? No  Elevated toilet seat or a handicapped toilet? Yes    Cognitive Function:        12/30/2022    4:09 PM 11/16/2019    9:59 AM 11/10/2018   10:15 AM 08/27/2016  9:17 AM  6CIT Screen  What Year?  0 points 0 points 0 points  What month?  0 points 0 points 0 points  What time? 0 points 0 points 0 points 0 points  Count back from 20 0 points 0 points 0 points 0 points  Months in reverse 0 points 0  points 0 points 0 points  Repeat phrase 0 points 0 points 0 points 2 points  Total Score  0 points 0 points 2 points    Immunizations Immunization History  Administered Date(s) Administered   Fluad Quad(high Dose 65+) 06/23/2019, 06/15/2020, 06/20/2021, 07/01/2022   Influenza, High Dose Seasonal PF 06/10/2015, 06/14/2016, 07/01/2017, 07/02/2018   PFIZER(Purple Top)SARS-COV-2 Vaccination 10/06/2019, 10/27/2019, 07/05/2020   Pfizer Covid-19 Vaccine Bivalent Booster 62yrs & up 08/12/2022   Pneumococcal Conjugate-13 01/16/2015   Pneumococcal Polysaccharide-23 08/27/2004   Rsv, Bivalent, Protein Subunit Rsvpref,pf [place] Lance) 09/14/2022   Tdap 05/27/2011   Zoster, Live 07/06/2013    TDAP status: Up to date  Flu Vaccine status: Up to date  Pneumococcal vaccine status: Up to date  Covid-19 vaccine status: Completed vaccines  Qualifies for Shingles Vaccine? Yes   Zostavax completed No   Shingrix Completed?: No.    Education has been provided regarding the importance of this vaccine. Patient has been advised to call insurance company to determine out of pocket expense if they have not yet received this vaccine. Advised may also receive vaccine at local pharmacy or Health Dept. Verbalized acceptance and understanding.  Screening Tests Health Maintenance  Topic Date Due   Zoster Vaccines- Shingrix (1 of 2) Never done   DTaP/Tdap/Td (2 - Td or Tdap) 05/26/2021   COVID-19 Vaccine (5 - 2023-24 season) 10/07/2022   INFLUENZA VACCINE  05/01/2023   Medicare Annual Wellness (AWV)  12/30/2023   DEXA SCAN  11/05/2026   Pneumonia Vaccine 58+ Years old  Completed   HPV VACCINES  Aged Out    Health Maintenance  Health Maintenance Due  Topic Date Due   Zoster Vaccines- Shingrix (1 of 2) Never done   DTaP/Tdap/Td (2 - Td or Tdap) 05/26/2021   COVID-19 Vaccine (5 - 2023-24 season) 10/07/2022    Colorectal cancer screening: No longer required.   Mammogram status: No longer required due to  age.  Bone Density status: Completed yes. Results reflect: Bone density results: OSTEOPOROSIS. Repeat every 5 years.  Lung Cancer Screening: (Low Dose CT Chest recommended if Age 39-80 years, 30 pack-year currently smoking OR have quit w/in 15years.) does not qualify.   Lung Cancer Screening Referral: no  Additional Screening:  Hepatitis C Screening: does not qualify; Completed yes  Vision Screening: Recommended annual ophthalmology exams for early detection of glaucoma and other disorders of the eye. Is the patient up to date with their annual eye exam?  Yes  Who is the provider or what is the name of the office in which the patient attends annual eye exams? [place] If pt is not established with a provider, would they like to be referred to a provider to establish care? No .   Dental Screening: Recommended annual dental exams for proper oral hygiene  Community Resource Referral / Chronic Care Management: CRR required this visit?  No   CCM required this visit?  No      Plan:     I have personally reviewed and noted the following in the patient's chart:   Medical and social history Use of alcohol, tobacco or illicit drugs  Current medications and supplements including  opioid prescriptions. Patient is not currently taking opioid prescriptions. Functional ability and status Nutritional status Physical activity Advanced directives List of other physicians Hospitalizations, surgeries, and ER visits in previous 12 months Vitals Screenings to include cognitive, depression, and falls Referrals and appointments  In addition, I have reviewed and discussed with patient certain preventive protocols, quality metrics, and best practice recommendations. A written personalized care plan for preventive services as well as general preventive health recommendations were provided to patient.     Roger Shelter, LPN   [id number]   Nurse Notes: pt states she is doing well other than  dealing with her "knee issue". She denies any pain at this time and has no concerns or questions.

## 2022-12-31 ENCOUNTER — Encounter: Payer: Self-pay | Admitting: Family Medicine

## 2022-12-31 ENCOUNTER — Ambulatory Visit (INDEPENDENT_AMBULATORY_CARE_PROVIDER_SITE_OTHER): Payer: Medicare HMO | Admitting: Family Medicine

## 2022-12-31 ENCOUNTER — Other Ambulatory Visit: Payer: Self-pay | Admitting: Family Medicine

## 2022-12-31 VITALS — BP 171/83 | HR 99 | Temp 98.1°F | Resp 12 | Ht 63.0 in | Wt 108.7 lb

## 2022-12-31 DIAGNOSIS — Z0000 Encounter for general adult medical examination without abnormal findings: Secondary | ICD-10-CM

## 2022-12-31 DIAGNOSIS — E7800 Pure hypercholesterolemia, unspecified: Secondary | ICD-10-CM

## 2022-12-31 DIAGNOSIS — M791 Myalgia, unspecified site: Secondary | ICD-10-CM | POA: Insufficient documentation

## 2022-12-31 DIAGNOSIS — I10 Essential (primary) hypertension: Secondary | ICD-10-CM

## 2022-12-31 DIAGNOSIS — D649 Anemia, unspecified: Secondary | ICD-10-CM

## 2022-12-31 DIAGNOSIS — E559 Vitamin D deficiency, unspecified: Secondary | ICD-10-CM

## 2022-12-31 MED ORDER — ROSUVASTATIN CALCIUM 5 MG PO TABS: 5.0000 mg | ORAL_TABLET | ORAL | 1 refills | Status: DC

## 2022-12-31 MED ORDER — LOSARTAN POTASSIUM 50 MG PO TABS: 50.0000 mg | ORAL_TABLET | Freq: Two times a day (BID) | ORAL | 1 refills | Status: DC

## 2022-12-31 NOTE — Assessment & Plan Note (Signed)
Chronic  Stable on statin  Will recheck lipid panel today  Reports myalgias, will check CK today  Recommended that she start taking crestor 5mg  every other day to see if this helps with myalgias of calf muscles  Will also check CMP today  Refills provided for crestor  Will plan to recheck cholesterol in 3 months

## 2022-12-31 NOTE — Assessment & Plan Note (Addendum)
Age appropriate vaccines recommended  Labs ordered including lipid panel,CMP, CBC  Follow up in 1 year for annual physical/AWV  Recommended regular physical activity 4 days per week along with 2-3 days of resistance training as tolerated per week

## 2022-12-31 NOTE — Assessment & Plan Note (Signed)
Considered this is 2/2 to statin therapy  Will check CK today, CMP ordered today to check LFTS as well

## 2022-12-31 NOTE — Assessment & Plan Note (Signed)
Chronic  BP elevated in office with normal BP measurements  See Media tab for patient's home BP recordings  Suspect element of white coat HTN  Given normal home BP, she will continue regimen of hydralazine 10mg  BID and losartan 50mg  daily without addition of other medications  for HTN Today  CMP ordered

## 2022-12-31 NOTE — Assessment & Plan Note (Signed)
Chronic  Will check CBC today

## 2022-12-31 NOTE — Progress Notes (Signed)
I,[name],acting as a scribe for [employer], MD.,have documented all relevant documentation on the behalf of [name], MD,as directed by  [name], MD while in the presence of [name], MD.   Complete physical exam   Patient: Morgan Ponce   DOB: 03-Feb-1941   82 y.o. Female  MRN: [id number] Visit Date: 12/31/2022  Today's healthcare provider: [name], MD   Chief Complaint  Patient presents with   Annual Exam   Subjective    Morgan Ponce is a 82 y.o. female who presents today for a complete physical exam.   She reports consuming a general diet. Patient walked outside a couple times a week.  She generally feels well. She reports sleeping well. She does not have additional problems to discuss today.  HPI  Patient had AWV done 12/30/2022  Patient wants 90 day supply not 30 days on her medications. Patient blood pressure this morning was 134/75.   Past Medical History:  Diagnosis Date   Arthritis    Dysrhythmia    HOH (hard of hearing)    AIDS   Hyperlipidemia    Hypertension    RESTARTED BP MED SINCE FIRST CATARACT   Pain    RIGHT KNEE   Skin cancer of forehead    Past Surgical History:  Procedure Laterality Date   BREAST BIOPSY Right 08/22/2014   benign. Done by Dr Collene Schlichter   BREAST EXCISIONAL BIOPSY Right 1972   benign   CATARACT EXTRACTION W/PHACO Right 05/06/2017   Procedure: CATARACT EXTRACTION PHACO AND INTRAOCULAR LENS PLACEMENT (IOC);  Surgeon: Birder Robson, MD;  Location: [facility] ORS;  Service: Ophthalmology;  Laterality: Right;  [place] 00:40 AP% 17.6 CDE 7.16 Fluid pack lot # [id number]   CATARACT EXTRACTION W/PHACO Left 09/02/2017   Procedure: CATARACT EXTRACTION PHACO AND INTRAOCULAR LENS PLACEMENT (IOC);  Surgeon: Birder Robson, MD;  Location: [facility] ORS;  Service: Ophthalmology;  Laterality: Left;  [place] 00:39 AP% 15.0 CDE 5.91 Fluid pack lot # [id number]   CHOLECYSTECTOMY   2013   COLONOSCOPY N/A 02/06/2015   Procedure: COLONOSCOPY;  Surgeon: Hulen Luster, MD;  Location: [facility] ENDOSCOPY;  Service: Gastroenterology;  Laterality: N/A;   HYSTEROSCOPY  1999   Social History   Socioeconomic History   Marital status: Widowed    Spouse name: Not on file   Number of children: 0   Years of education: Not on file   Highest education level: High school graduate  Occupational History   Occupation: retired  Tobacco Use   Smoking status: Never   Smokeless tobacco: Never  Vaping Use   Vaping Use: Never used  Substance and Sexual Activity   Alcohol use: No    Alcohol/week: 0.0 standard drinks of alcohol   Drug use: No   Sexual activity: Never  Other Topics Concern   Not on file  Social History Narrative   Not on file   Social Determinants of Health   Financial Resource Strain: Low Risk  (12/30/2022)   Overall Financial Resource Strain (CARDIA)    Difficulty of Paying Living Expenses: Not hard at all  Food Insecurity: No Food Insecurity (12/30/2022)   Hunger Vital Sign    Worried About Running Out of Food in the Last Year: Never true    [place] in the Last Year: Never true  Transportation Needs: No Transportation Needs (12/30/2022)   PRAPARE - Hydrologist (Medical): No    Lack of Transportation (Non-Medical):  No  Physical Activity: Insufficiently Active (12/30/2022)   Exercise Vital Sign    Days of Exercise per Week: 2 days    Minutes of Exercise per Session: 20 min  Stress: No Stress Concern Present (12/30/2022)   [place]    Feeling of Stress : Not at all  Social Connections: Moderately Isolated (12/30/2022)   Social Connection and Isolation Panel [NHANES]    Frequency of Communication with Friends and Family: More than three times a week    Frequency of Social Gatherings with Friends and Family: More than three times a week    Attends Religious Services: More  than 4 times per year    Active Member of [employer] or Organizations: No    Attends Archivist Meetings: Never    Marital Status: Widowed  Intimate Partner Violence: Not At Risk (12/30/2022)   Humiliation, Afraid, Rape, and Kick questionnaire    Fear of Current or Ex-Partner: No    Emotionally Abused: No    Physically Abused: No    Sexually Abused: No   Family Status  Relation Name Status   Mother  Deceased at age 58   Father  Deceased at age 2   Sister  Deceased   Brother  Deceased at age 71   Sister  Deceased   Family History  Problem Relation Age of Onset   Hypertension Mother    Stroke Mother    Heart disease Father    Hypertension Father    Hypertension Sister    Breast cancer Sister 75   Heart attack Brother    Allergies  Allergen Reactions   Cardizem [Diltiazem] Rash   Diltiazem Hcl Rash    Patient Care Team: [name], MD as PCP - General (Family Medicine) Birder Robson, MD as Referring Physician (Ophthalmology) Dasher, Rayvon Char, MD (Dermatology) Throat, [facility]   Medications: Outpatient Medications Prior to Visit  Medication Sig   alendronate (FOSAMAX) 70 MG tablet TAKE 1 TABLET (70 MG TOTAL) BY MOUTH EVERY 7 DAYS WITH FULL GLASS WATER ON EMPTY STOMACH   aspirin 81 MG tablet Take 81 mg at bedtime by mouth.    hydrALAZINE (APRESOLINE) 10 MG tablet TAKE 1 TABLET BY MOUTH TWICE A DAY   Polyvinyl Alcohol-Povidone (REFRESH OP) Place 1 drop 5 (five) times daily as needed into both eyes (DRY EYES).   Vitamin D, Cholecalciferol, 1000 UNITS TABS Take 1 tablet by mouth daily.   [DISCONTINUED] Blood Pressure Monitoring (BLOOD PRESSURE KIT) DEVI Check BP twice a day in morning and evening   [DISCONTINUED] losartan (COZAAR) 50 MG tablet Take 1 tablet (50 mg total) by mouth in the morning and at bedtime.   [DISCONTINUED] nystatin-triamcinolone ointment (MYCOLOG) Apply 1 Application topically 2 (two) times daily.   [DISCONTINUED]  rosuvastatin (CRESTOR) 5 MG tablet TAKE 1 TABLET (5 MG TOTAL) BY MOUTH DAILY.   [DISCONTINUED] prochlorperazine (COMPAZINE) 5 MG tablet Take 1 tablet (5 mg total) by mouth every 6 (six) hours as needed for nausea or vomiting. (Patient not taking: Reported on 12/30/2022)   No facility-administered medications prior to visit.    Review of Systems  All other systems reviewed and are negative.      Objective    BP (!) 171/83 (BP Location: Right Arm, Patient Position: Sitting, Cuff Size: Normal)   Pulse 99   Temp 98.1 F (36.7 C) (Oral)   Resp 12   Ht 5\' 3"  (1.6 m)   Wt  108 lb 11.2 oz (49.3 kg)   BMI 19.26 kg/m      Physical Exam Vitals reviewed.  Constitutional:      General: She is not in acute distress.    Appearance: Normal appearance. She is not ill-appearing, toxic-appearing or diaphoretic.  Eyes:     Conjunctiva/sclera: Conjunctivae normal.  Neck:     Thyroid: No thyroid mass, thyromegaly or thyroid tenderness.     Vascular: No carotid bruit.  Cardiovascular:     Rate and Rhythm: Normal rate and regular rhythm.     Pulses: Normal pulses.     Heart sounds: Normal heart sounds. No murmur heard.    No friction rub. No gallop.  Pulmonary:     Effort: Pulmonary effort is normal. No respiratory distress.     Breath sounds: Normal breath sounds. No stridor. No wheezing, rhonchi or rales.  Abdominal:     General: Bowel sounds are normal. There is no distension.     Palpations: Abdomen is soft.     Tenderness: There is no abdominal tenderness.  Musculoskeletal:     Right lower leg: No edema.     Left lower leg: No edema.  Lymphadenopathy:     Cervical: No cervical adenopathy.  Skin:    Findings: No erythema or rash.  Neurological:     Mental Status: She is alert and oriented to person, place, and time.     Last depression screening scores    12/30/2022    4:05 PM 08/28/2022    2:50 PM 07/01/2022   10:05 AM  PHQ 2/9 Scores  PHQ - 2 Score 0 0 0  PHQ- 9 Score  0     Last fall risk screening    12/30/2022    4:01 PM  [place] in the past year? 0  Number falls in past yr: 0  Injury with Fall? 0  Risk for fall due to : No Fall Risks  Follow up Education provided;Falls prevention discussed   Last Audit-C alcohol use screening    12/30/2022    4:05 PM  Alcohol Use Disorder Test (AUDIT)  1. How often do you have a drink containing alcohol? 0  2. How many drinks containing alcohol do you have on a typical day when you are drinking? 0  3. How often do you have six or more drinks on one occasion? 0  AUDIT-C Score 0   A score of 3 or more in women, and 4 or more in men indicates increased risk for alcohol abuse, EXCEPT if all of the points are from question 1   No results found for any visits on 12/31/22.  Assessment & Plan    Routine Health Maintenance and Physical Exam    Immunization History  Administered Date(s) Administered   Fluad Quad(high Dose 65+) 06/23/2019, 06/15/2020, 06/20/2021, 07/01/2022   Influenza, High Dose Seasonal PF 06/10/2015, 06/14/2016, 07/01/2017, 07/02/2018   PFIZER(Purple Top)SARS-COV-2 Vaccination 10/06/2019, 10/27/2019, 07/05/2020   Pfizer Covid-19 Vaccine Bivalent Booster 71yrs & up 08/12/2022   Pneumococcal Conjugate-13 01/16/2015   Pneumococcal Polysaccharide-23 08/27/2004   Rsv, Bivalent, Protein Subunit Rsvpref,pf Evans Lance) 09/14/2022   Tdap 05/27/2011   Zoster, Live 07/06/2013    Health Maintenance  Topic Date Due   Zoster Vaccines- Shingrix (1 of 2) Never done   DTaP/Tdap/Td (2 - Td or Tdap) 05/26/2021   COVID-19 Vaccine (5 - 2023-24 season) 10/07/2022   INFLUENZA VACCINE  05/01/2023   Medicare Annual Wellness (AWV)  12/30/2023  DEXA SCAN  11/05/2026   Pneumonia Vaccine 3+ Years old  Completed   HPV VACCINES  Aged Out      Problem List Items Addressed This Visit       Cardiovascular and Mediastinum   Hypertension - Primary    Chronic  BP elevated in office with normal BP  measurements  See Media tab for patient's home BP recordings  Suspect element of white coat HTN  Given normal home BP, she will continue regimen of hydralazine 10mg  BID and losartan 50mg  daily without addition of other medications  for HTN Today  CMP ordered       Relevant Medications   losartan (COZAAR) 50 MG tablet   rosuvastatin (CRESTOR) 5 MG tablet   Other Relevant Orders   Comprehensive metabolic panel     Other   Anemia    Chronic  Will check CBC today       Relevant Orders   CBC   Hypercholesteremia    Chronic  Stable on statin  Will recheck lipid panel today  Reports myalgias, will check CK today  Recommended that she start taking crestor 5mg  every other day to see if this helps with myalgias of calf muscles  Will also check CMP today  Refills provided for crestor  Will plan to recheck cholesterol in 3 months       Relevant Medications   losartan (COZAAR) 50 MG tablet   rosuvastatin (CRESTOR) 5 MG tablet   Other Relevant Orders   Lipid panel   Myalgia    Considered this is 2/2 to statin therapy  Will check CK today, CMP ordered today to check LFTS as well        Relevant Orders   CK (Creatine Kinase)   Annual physical exam    Age appropriate vaccines recommended  Labs ordered including lipid panel,CMP, CBC  Follow up in 1 year for annual physical/AWV  Recommended regular physical activity 4 days per week along with 2-3 days of resistance training as tolerated per week         Relevant Orders   Comprehensive metabolic panel   CBC   Lipid panel   RESOLVED: Avitaminosis D     Return in about 3 months (around 04/01/2023) for HTN, cholesterol lab.       The entirety of the information documented in the History of Present Illness, Review of Systems and Physical Exam were personally obtained by me. Portions of this information were initially documented by Lyndel Pleasure, CMA . I, [name], MD have reviewed the documentation above for  thoroughness and accuracy.      [name], MD  [facility] [phone] (phone) [phone] (fax)  [place]

## 2022-12-31 NOTE — Patient Instructions (Addendum)
It was a pleasure to see you today!  Thank you for choosing [facility] for your primary care.   Morgan Ponce was seen for physical, blood pressure and muscle aches.   Our plans for today were: We will check your labs today to assess for cholesterol levels as well as any muscle break down due to the intermittent muscle symptoms.  I would like for you to start taking your Crestor 5mg  every other day for the next 3 months Your blood pressure is well controlled today. Please continue your medications as prescribed.  We will follow up with results of labs once they are available.    To keep you healthy, please keep in mind the following health maintenance items that you are due for:   Shingrix vaccine   Tetanus vaccine   These vaccines can be administered at your pharmacy.    You should return to our clinic in 3 months for cholesterol.   Best Wishes,   Dr. Quentin Cornwall

## 2023-01-01 LAB — LIPID PANEL
Chol/HDL Ratio: 2.2 ratio (ref 0.0–4.4)
Cholesterol, Total: 145 mg/dL (ref 100–199)
HDL: 67 mg/dL (ref >39)
LDL Chol Calc (NIH): 66 mg/dL (ref 0–99)
Triglycerides: 59 mg/dL (ref 0–149)
VLDL Cholesterol Cal: 12 mg/dL (ref 5–40)

## 2023-01-01 LAB — COMPREHENSIVE METABOLIC PANEL
ALT: 14 IU/L (ref 0–32)
AST: 23 IU/L (ref 0–40)
Albumin/Globulin Ratio: 1.5 (ref 1.2–2.2)
Albumin: 4.1 g/dL (ref 3.7–4.7)
Alkaline Phosphatase: 53 IU/L (ref 44–121)
BUN/Creatinine Ratio: 27 (ref 12–28)
BUN: 24 mg/dL (ref 8–27)
Bilirubin Total: 0.3 mg/dL (ref 0.0–1.2)
CO2: 25 mmol/L (ref 20–29)
Calcium: 9.5 mg/dL (ref 8.7–10.3)
Chloride: 104 mmol/L (ref 96–106)
Creatinine, Ser: 0.88 mg/dL (ref 0.57–1.00)
Globulin, Total: 2.7 g/dL (ref 1.5–4.5)
Glucose: 95 mg/dL (ref 70–99)
Potassium: 4.6 mmol/L (ref 3.5–5.2)
Sodium: 142 mmol/L (ref 134–144)
Total Protein: 6.8 g/dL (ref 6.0–8.5)
eGFR: 65 mL/min/{1.73_m2} (ref >59)

## 2023-01-01 LAB — CBC
Hematocrit: 31.7 % — ABNORMAL LOW (ref 34.0–46.6)
Hemoglobin: 9.8 g/dL — ABNORMAL LOW (ref 11.1–15.9)
MCH: 27.5 pg (ref 26.6–33.0)
MCHC: 30.9 g/dL — ABNORMAL LOW (ref 31.5–35.7)
MCV: 89 fL (ref 79–97)
Platelets: 222 10*3/uL (ref 150–450)
RBC: 3.56 x10E6/uL — ABNORMAL LOW (ref 3.77–5.28)
RDW: 15.4 % (ref 11.7–15.4)
WBC: 4.8 10*3/uL (ref 3.4–10.8)

## 2023-01-01 LAB — CK: Total CK: 77 U/L (ref 26–161)

## 2023-02-12 ENCOUNTER — Ambulatory Visit (INDEPENDENT_AMBULATORY_CARE_PROVIDER_SITE_OTHER): Payer: Medicare HMO | Admitting: Family Medicine

## 2023-02-12 ENCOUNTER — Encounter: Payer: Self-pay | Admitting: Family Medicine

## 2023-02-12 ENCOUNTER — Ambulatory Visit
Admission: RE | Admit: 2023-02-12 | Discharge: 2023-02-12 | Disposition: A | Discharge status: Home / Self Care | Payer: Medicare HMO | Attending: Family Medicine | Admitting: Family Medicine

## 2023-02-12 ENCOUNTER — Ambulatory Visit
Admission: RE | Admit: 2023-02-12 | Discharge: 2023-02-12 | Disposition: A | Discharge status: Home / Self Care | Payer: Medicare HMO | Source: Ambulatory Visit | Attending: Family Medicine | Admitting: Family Medicine

## 2023-02-12 VITALS — BP 173/79 | HR 96 | Temp 98.7°F | Resp 18 | Wt 107.1 lb

## 2023-02-12 DIAGNOSIS — R058 Other specified cough: Secondary | ICD-10-CM

## 2023-02-12 DIAGNOSIS — R059 Cough, unspecified: Secondary | ICD-10-CM | POA: Diagnosis not present

## 2023-02-12 DIAGNOSIS — I10 Essential (primary) hypertension: Secondary | ICD-10-CM | POA: Diagnosis not present

## 2023-02-12 MED ORDER — BENZONATATE 100 MG PO CAPS
100.0000 mg | ORAL_CAPSULE | Freq: Two times a day (BID) | ORAL | 0 refills | Status: DC | PRN
Start: 2023-02-12 — End: 2023-04-02

## 2023-02-12 MED ORDER — GUAIFENESIN ER 600 MG PO TB12
600.0000 mg | ORAL_TABLET | Freq: Two times a day (BID) | ORAL | 0 refills | Status: DC
Start: 2023-02-12 — End: 2023-04-02

## 2023-02-12 NOTE — Patient Instructions (Addendum)
Your COVID test was NEGATIVE today   Please continue to drink plenty of fluids and use the tessalon perles prescription as needed for cough   The mucinex can be used to help cough up phelgm.    Please return to care if you experience shortness of breath or lightheadedness.    As discussed, please go for an xray.   Please report to [facility] located at:  [address]  [place], [place] [address]  You do not need an appointment to have xrays completed.   Our office will follow up with  results once available.

## 2023-02-12 NOTE — Assessment & Plan Note (Addendum)
Acute Patient stable although noted oxygen saturation between 94-95% in office today Patient is not tachycardic Recommended chest x-ray and will prescribe Tessalon Perles 100 mg twice daily as needed for cough Also prescribed expectorant, guaifenesin 600 mg twice daily to take as needed Patient was counseled to return to care if she has shortness of breath, lightheadedness dizziness or if she has worsening of symptoms

## 2023-02-12 NOTE — Assessment & Plan Note (Signed)
Chronic Stable Patient noted to be hypertensive during vitals assessment, consistent with previous measurements, suspect patient has whitecoat hypertension compared to majority of normal values on ambulatory measurements Ambulatory blood pressures reviewed via patient's log brought into the day She will continue hydralazine 10 mg twice daily and losartan 50 mg twice daily

## 2023-02-12 NOTE — Progress Notes (Signed)
I,Morgan Ponce,acting as a scribe for [employer], MD.,have documented all relevant documentation on the behalf of Morgan Ramp, MD,as directed by  Morgan Ramp, MD while in the presence of Morgan Ramp, MD.   Established patient visit   Patient: Morgan Ponce   DOB: [birth year]   82 y.o. Female  MRN: [id number] Visit Date: 02/12/2023  Today's healthcare provider: [name], MD   Chief Complaint  Patient presents with   URI   Subjective    URI  This is a new problem. Episode onset: over the weekend. There has been no fever. Associated symptoms include congestion, coughing ("productive") and rhinorrhea ("some"). She has tried decongestant (Cold and Flu HBP) for the symptoms. The treatment provided no relief.    Hypertension: Patient has her blood pressure log with her in office today.  Home blood pressures range from 110-147/60s-80s.  Patient reports feeling well and tolerating blood pressure medication well.  Patient noted to be hypertensive during assessment of vital signs for today's visit.  Medications: Outpatient Medications Prior to Visit  Medication Sig   alendronate (FOSAMAX) 70 MG tablet TAKE 1 TABLET (70 MG TOTAL) BY MOUTH EVERY 7 DAYS WITH FULL GLASS WATER ON EMPTY STOMACH   aspirin 81 MG tablet Take 81 mg at bedtime by mouth.    hydrALAZINE (APRESOLINE) 10 MG tablet TAKE 1 TABLET BY MOUTH TWICE A DAY   losartan (COZAAR) 50 MG tablet Take 1 tablet (50 mg total) by mouth in the morning and at bedtime.   Polyvinyl Alcohol-Povidone (REFRESH OP) Place 1 drop 5 (five) times daily as needed into both eyes (DRY EYES).   rosuvastatin (CRESTOR) 5 MG tablet Take 1 tablet (5 mg total) by mouth every other day.   Vitamin D, Cholecalciferol, 1000 UNITS TABS Take 1 tablet by mouth daily.   No facility-administered medications prior to visit.    Review of Systems  HENT:  Positive for congestion and rhinorrhea  ("some").   Respiratory:  Positive for cough ("productive").        Objective    BP (!) 173/79 (BP Location: Left Arm, Patient Position: Sitting, Cuff Size: Normal)   Pulse 96   Temp 98.7 F (37.1 C) (Oral)   Resp 18   Wt 107 lb 1.6 oz (48.6 kg)   SpO2 95% Comment: 96-95% after coughing  BMI 18.97 kg/m    Physical Exam Vitals reviewed.  Constitutional:      General: She is not in acute distress.    Appearance: Normal appearance. She is not ill-appearing, toxic-appearing or diaphoretic.  Eyes:     Conjunctiva/sclera: Conjunctivae normal.  Cardiovascular:     Rate and Rhythm: Normal rate and regular rhythm.     Pulses: Normal pulses.     Heart sounds: Normal heart sounds. No murmur heard.    No friction rub. No gallop.  Pulmonary:     Effort: Pulmonary effort is normal. No accessory muscle usage, prolonged expiration, respiratory distress or retractions.     Breath sounds: Normal breath sounds. No stridor, decreased air movement or transmitted upper airway sounds. No decreased breath sounds, wheezing (no increased respiratory effort on RA), rhonchi or rales.  Abdominal:     General: Bowel sounds are normal. There is no distension.     Palpations: Abdomen is soft.     Tenderness: There is no abdominal tenderness.  Musculoskeletal:     Right lower leg: No edema.     Left lower leg: No edema.  Skin:  Findings: No erythema or rash.  Neurological:     Mental Status: She is alert and oriented to person, place, and time.       No results found for any visits on 02/12/23.  Assessment & Plan     Problem List Items Addressed This Visit       Cardiovascular and Mediastinum   Hypertension    Chronic Stable Patient noted to be hypertensive during vitals assessment, consistent with previous measurements, suspect patient has whitecoat hypertension compared to majority of normal values on ambulatory measurements Ambulatory blood pressures reviewed via patient's log brought  into the day She will continue hydralazine 10 mg twice daily and losartan 50 mg twice daily         Other   Cough productive of clear sputum - Primary    Acute Patient stable although noted oxygen saturation between 94-95% in office today Patient is not tachycardic Recommended chest x-ray and will prescribe Tessalon Perles 100 mg twice daily as needed for cough Also prescribed expectorant, guaifenesin 600 mg twice daily to take as needed Patient was counseled to return to care if she has shortness of breath, lightheadedness dizziness or if she has worsening of symptoms      Relevant Medications   benzonatate (TESSALON) 100 MG capsule   guaiFENesin (MUCINEX) 600 MG 12 hr tablet   Other Relevant Orders   DG Chest 2 View     Return if symptoms worsen or fail to improve.        The entirety of the information documented in the History of Present Illness, Review of Systems and Physical Exam were personally obtained by me. Portions of this information were initially documented by Hetty Ely, CMA . I, Morgan Ramp, MD have reviewed the documentation above for thoroughness and accuracy.      Morgan Ramp, MD  [facility] [phone] (phone) [phone] (fax)  [facility]

## 2023-02-17 ENCOUNTER — Telehealth: Payer: Self-pay | Admitting: Family Medicine

## 2023-02-17 ENCOUNTER — Other Ambulatory Visit: Payer: Self-pay | Admitting: Family Medicine

## 2023-02-17 DIAGNOSIS — R058 Other specified cough: Secondary | ICD-10-CM

## 2023-02-17 MED ORDER — PREDNISONE 10 MG PO TABS
10.0000 mg | ORAL_TABLET | Freq: Every day | ORAL | 0 refills | Status: DC
Start: 2023-02-17 — End: 2023-04-02

## 2023-02-17 NOTE — Telephone Encounter (Signed)
RX for prednisone 10mg  daily for 7 day course sent to patient's pharmacy.   Recommend patient proceed to urgent care if she develops SOB, lightheadedness or chest discomfort.   [name], MD [facility]

## 2023-02-17 NOTE — Telephone Encounter (Signed)
Patient advised.

## 2023-04-01 NOTE — Progress Notes (Unsigned)
Established patient visit   Patient: Morgan Ponce   DOB: 28-Dec-1940   82 y.o. Female  MRN: [id number] Visit Date: 04/02/2023  Today's healthcare provider: [name], MD   No chief complaint on file.  Subjective      HTN  Hypertension, follow-up  BP Readings from Last 3 Encounters:  02/12/23 (!) 173/79  12/31/22 (!) 171/83  09/19/22 132/62   Wt Readings from Last 3 Encounters:  02/12/23 107 lb 1.6 oz (48.6 kg)  12/31/22 108 lb 11.2 oz (49.3 kg)  12/30/22 103 lb (46.7 kg)     She was last seen for hypertension {NUMBERS 1-12:18279} {days/wks/mos/yrs:310907} ago.  BP at that visit was ***. Management since that visit includes ***.  She reports {excellent/good/fair/poor:19665} compliance with treatment. She {is/is not:9024} having side effects. {document side effects if present:1} She is following a {diet:21022986} diet. She {is/is not:9024} exercising. She {does/does not:200015} smoke.  Use of agents associated with hypertension: {bp agents assoc with hypertension:511::"none"}.   Outside blood pressures are {***enter patient reported home BP readings, or 'not being checked':1}. Symptoms: {Yes/No:20286} chest pain {Yes/No:20286} chest pressure  {Yes/No:20286} palpitations {Yes/No:20286} syncope  {Yes/No:20286} dyspnea {Yes/No:20286} orthopnea  {Yes/No:20286} paroxysmal nocturnal dyspnea {Yes/No:20286} lower extremity edema   Pertinent labs Lab Results  Component Value Date   CHOL 145 12/31/2022   HDL 67 12/31/2022   LDLCALC 66 12/31/2022   TRIG 59 12/31/2022   CHOLHDL 2.2 12/31/2022   Lab Results  Component Value Date   NA 142 12/31/2022   K 4.6 12/31/2022   CREATININE 0.88 12/31/2022   EGFR 65 12/31/2022   GLUCOSE 95 12/31/2022   TSH 3.070 12/25/2021     The ASCVD Risk score (Arnett DK, et al., 2019) failed to calculate for the following reasons:   The 2019 ASCVD risk score is only valid for ages 36 to  28  ---------------------------------------------------------------------------------------------------   HLD Lipid/Cholesterol, Follow-up  Last lipid panel Other pertinent labs  Lab Results  Component Value Date   CHOL 145 12/31/2022   HDL 67 12/31/2022   LDLCALC 66 12/31/2022   TRIG 59 12/31/2022   CHOLHDL 2.2 12/31/2022   Lab Results  Component Value Date   ALT 14 12/31/2022   AST 23 12/31/2022   PLT 222 12/31/2022   TSH 3.070 12/25/2021     She was last seen for this {1-12:18279} {days/wks/mos/yrs:310907} ago.  Management since that visit includes ***.  She reports {excellent/good/fair/poor:19665} compliance with treatment. She {is/is not:9024} having side effects. {document side effects if present:1}  Symptoms: {Yes/No:20286} chest pain {Yes/No:20286} chest pressure/discomfort  {Yes/No:20286} dyspnea {Yes/No:20286} lower extremity edema  {Yes/No:20286} numbness or tingling of extremity {Yes/No:20286} orthopnea  {Yes/No:20286} palpitations {Yes/No:20286} paroxysmal nocturnal dyspnea  {Yes/No:20286} speech difficulty {Yes/No:20286} syncope   Current diet: {diet habits:16563} Current exercise: {exercise types:16438}  The ASCVD Risk score (Arnett DK, et al., 2019) failed to calculate for the following reasons:   The 2019 ASCVD risk score is only valid for ages 38 to 33  ---------------------------------------------------------------------------------------------------    Medications: Outpatient Medications Prior to Visit  Medication Sig   alendronate (FOSAMAX) 70 MG tablet TAKE 1 TABLET (70 MG TOTAL) BY MOUTH EVERY 7 DAYS WITH FULL GLASS WATER ON EMPTY STOMACH   aspirin 81 MG tablet Take 81 mg at bedtime by mouth.    benzonatate (TESSALON) 100 MG capsule Take 1 capsule (100 mg total) by mouth 2 (two) times daily as needed for cough.   guaiFENesin (MUCINEX) 600 MG 12 hr tablet Take  1 tablet (600 mg total) by mouth 2 (two) times daily.   hydrALAZINE (APRESOLINE)  10 MG tablet TAKE 1 TABLET BY MOUTH TWICE A DAY   losartan (COZAAR) 50 MG tablet Take 1 tablet (50 mg total) by mouth in the morning and at bedtime.   Polyvinyl Alcohol-Povidone (REFRESH OP) Place 1 drop 5 (five) times daily as needed into both eyes (DRY EYES).   predniSONE (DELTASONE) 10 MG tablet Take 1 tablet (10 mg total) by mouth daily with breakfast.   rosuvastatin (CRESTOR) 5 MG tablet Take 1 tablet (5 mg total) by mouth every other day.   Vitamin D, Cholecalciferol, 1000 UNITS TABS Take 1 tablet by mouth daily.   No facility-administered medications prior to visit.    Review of Systems  {Labs  Heme  Chem  Endocrine  Serology  Results Review (optional):23779}   Objective    There were no vitals taken for this visit. {Show previous vital signs (optional):23777}  Physical Exam  ***  No results found for any visits on 04/02/23.  Assessment & Plan     Problem List Items Addressed This Visit   None    No follow-ups on file.         [name], MD  [facility] [phone] (phone) [phone] (fax)  [facility]

## 2023-04-02 ENCOUNTER — Ambulatory Visit (INDEPENDENT_AMBULATORY_CARE_PROVIDER_SITE_OTHER): Payer: Medicare HMO | Admitting: Family Medicine

## 2023-04-02 ENCOUNTER — Encounter: Payer: Self-pay | Admitting: Family Medicine

## 2023-04-02 VITALS — BP 144/75 | HR 94 | Temp 98.3°F | Resp 16 | Ht 63.0 in | Wt 107.9 lb

## 2023-04-02 DIAGNOSIS — D649 Anemia, unspecified: Secondary | ICD-10-CM

## 2023-04-02 DIAGNOSIS — I10 Essential (primary) hypertension: Secondary | ICD-10-CM

## 2023-04-02 DIAGNOSIS — E7800 Pure hypercholesterolemia, unspecified: Secondary | ICD-10-CM | POA: Diagnosis not present

## 2023-04-02 MED ORDER — HYDRALAZINE HCL 10 MG PO TABS: 10.0000 mg | ORAL_TABLET | Freq: Two times a day (BID) | ORAL | 1 refills | Status: DC

## 2023-04-02 MED ORDER — ROSUVASTATIN CALCIUM 5 MG PO TABS: 5.0000 mg | ORAL_TABLET | ORAL | 1 refills | Status: DC

## 2023-04-02 MED ORDER — LOSARTAN POTASSIUM 50 MG PO TABS: 50.0000 mg | ORAL_TABLET | Freq: Two times a day (BID) | ORAL | 1 refills | Status: DC

## 2023-04-02 MED ORDER — ALENDRONATE SODIUM 70 MG PO TABS: ORAL_TABLET | ORAL | 1 refills | Status: DC

## 2023-04-02 NOTE — Assessment & Plan Note (Signed)
Chronic Stable Reviewed patient's home BP logs and measurements are improved  Goal for patient with advanced age less than 140/90  BP slightly above goal but improved to less than 150/80 in office today  Patient asymptomatic  She will continue hydralazine 10 mg twice daily and losartan 50 mg twice daily

## 2023-04-02 NOTE — Patient Instructions (Signed)
Please continue your medications as prescribed   Your blood pressure looks great today!   We will follow up with results of labs once they are available.    I will see you in December for chronic follow up    Dr. Roxan Hockey

## 2023-04-02 NOTE — Assessment & Plan Note (Signed)
Chronic  Stable  No reported signs/symptoms of bleeding Will check CBC today  Occasional senile purpura noted on exam

## 2023-04-02 NOTE — Assessment & Plan Note (Signed)
Chronic  Stable on statin  Will recheck lipid panel today  Continue crestor 5mg  every other day

## 2023-04-03 LAB — CBC WITH DIFFERENTIAL/PLATELET
Basophils Absolute: 0 10*3/uL (ref 0.0–0.2)
Basos: 1 %
EOS (ABSOLUTE): 0.3 10*3/uL (ref 0.0–0.4)
Eos: 5 %
Hematocrit: 30.9 % — ABNORMAL LOW (ref 34.0–46.6)
Hemoglobin: 9.4 g/dL — ABNORMAL LOW (ref 11.1–15.9)
Immature Grans (Abs): 0 10*3/uL (ref 0.0–0.1)
Immature Granulocytes: 0 %
Lymphocytes Absolute: 1.3 10*3/uL (ref 0.7–3.1)
Lymphs: 26 %
MCH: 26.6 pg (ref 26.6–33.0)
MCHC: 30.4 g/dL — ABNORMAL LOW (ref 31.5–35.7)
MCV: 87 fL (ref 79–97)
Monocytes Absolute: 0.6 10*3/uL (ref 0.1–0.9)
Monocytes: 11 %
Neutrophils Absolute: 2.8 10*3/uL (ref 1.4–7.0)
Neutrophils: 57 %
Platelets: 252 10*3/uL (ref 150–450)
RBC: 3.54 x10E6/uL — ABNORMAL LOW (ref 3.77–5.28)
RDW: 14.5 % (ref 11.7–15.4)
WBC: 5 10*3/uL (ref 3.4–10.8)

## 2023-04-03 LAB — LIPID PANEL WITH LDL/HDL RATIO
Cholesterol, Total: 147 mg/dL (ref 100–199)
HDL: 62 mg/dL (ref >39)
LDL Chol Calc (NIH): 74 mg/dL (ref 0–99)
LDL/HDL Ratio: 1.2 ratio (ref 0.0–3.2)
Triglycerides: 51 mg/dL (ref 0–149)
VLDL Cholesterol Cal: 11 mg/dL (ref 5–40)

## 2023-04-21 DIAGNOSIS — M810 Age-related osteoporosis without current pathological fracture: Secondary | ICD-10-CM | POA: Diagnosis not present

## 2023-04-21 DIAGNOSIS — Z974 Presence of external hearing-aid: Secondary | ICD-10-CM | POA: Diagnosis not present

## 2023-04-21 DIAGNOSIS — Z8249 Family history of ischemic heart disease and other diseases of the circulatory system: Secondary | ICD-10-CM | POA: Diagnosis not present

## 2023-04-21 DIAGNOSIS — Z888 Allergy status to other drugs, medicaments and biological substances status: Secondary | ICD-10-CM | POA: Diagnosis not present

## 2023-04-21 DIAGNOSIS — E785 Hyperlipidemia, unspecified: Secondary | ICD-10-CM | POA: Diagnosis not present

## 2023-04-21 DIAGNOSIS — I951 Orthostatic hypotension: Secondary | ICD-10-CM | POA: Diagnosis not present

## 2023-04-21 DIAGNOSIS — Z7982 Long term (current) use of aspirin: Secondary | ICD-10-CM | POA: Diagnosis not present

## 2023-04-21 DIAGNOSIS — I129 Hypertensive chronic kidney disease with stage 1 through stage 4 chronic kidney disease, or unspecified chronic kidney disease: Secondary | ICD-10-CM | POA: Diagnosis not present

## 2023-04-21 DIAGNOSIS — Z7983 Long term (current) use of bisphosphonates: Secondary | ICD-10-CM | POA: Diagnosis not present

## 2023-04-21 DIAGNOSIS — Z823 Family history of stroke: Secondary | ICD-10-CM | POA: Diagnosis not present

## 2023-04-21 DIAGNOSIS — N182 Chronic kidney disease, stage 2 (mild): Secondary | ICD-10-CM | POA: Diagnosis not present

## 2023-04-21 DIAGNOSIS — M1990 Unspecified osteoarthritis, unspecified site: Secondary | ICD-10-CM | POA: Diagnosis not present

## 2023-05-23 ENCOUNTER — Other Ambulatory Visit: Payer: Self-pay | Admitting: Family Medicine

## 2023-06-30 DIAGNOSIS — M3501 Sicca syndrome with keratoconjunctivitis: Secondary | ICD-10-CM | POA: Diagnosis not present

## 2023-06-30 DIAGNOSIS — Z961 Presence of intraocular lens: Secondary | ICD-10-CM | POA: Diagnosis not present

## 2023-06-30 DIAGNOSIS — H43813 Vitreous degeneration, bilateral: Secondary | ICD-10-CM | POA: Diagnosis not present

## 2023-06-30 DIAGNOSIS — H53433 Sector or arcuate defects, bilateral: Secondary | ICD-10-CM | POA: Diagnosis not present

## 2023-07-07 ENCOUNTER — Ambulatory Visit (INDEPENDENT_AMBULATORY_CARE_PROVIDER_SITE_OTHER): Payer: Medicare HMO | Admitting: Family Medicine

## 2023-07-07 ENCOUNTER — Encounter: Payer: Self-pay | Admitting: Family Medicine

## 2023-07-07 VITALS — BP 140/92 | HR 92 | Ht 63.0 in | Wt 108.1 lb

## 2023-07-07 DIAGNOSIS — I10 Essential (primary) hypertension: Secondary | ICD-10-CM | POA: Diagnosis not present

## 2023-07-07 DIAGNOSIS — D508 Other iron deficiency anemias: Secondary | ICD-10-CM

## 2023-07-07 DIAGNOSIS — M810 Age-related osteoporosis without current pathological fracture: Secondary | ICD-10-CM | POA: Diagnosis not present

## 2023-07-07 NOTE — Patient Instructions (Signed)
VISIT SUMMARY:  During your visit, we discussed your ongoing conditions of anemia, osteoporosis, and hypertension. You reported no new health issues and no symptoms of blood loss or shortness of breath. You also mentioned a recent episode of severe leg pain after taking Alendronate, and we discussed your concerns about this medication. We also talked about your recent vaccinations and your interest in getting a flu shot.  YOUR PLAN:  -HYPERTENSION: Your blood pressure is slightly high, but it has improved. We will continue your current blood pressure medications, Hydralazine and Losartan.  -ANEMIA: Your hemoglobin level was previously low, which is why you were taking iron supplements. We will order a blood test to check your current hemoglobin level and see how you're responding to the iron supplements.  -OSTEOPOROSIS: You've been taking Alendronate for your osteoporosis, but you reported leg pain after taking it. We will stop the Alendronate for a month to see if your leg pain resolves. If you can't tolerate Alendronate  we may consider a different treatment called Prolia.  INSTRUCTIONS:  We will give you the flu shot today. We also plan for you to get the RSV and COVID vaccines, but we need to space them out by at least 2 weeks. Continue taking your daily Aspirin and Vitamin D for bone health.

## 2023-07-07 NOTE — Assessment & Plan Note (Signed)
Previously low hemoglobin (9.1) with patient started on Ferrous Sulfate 65mg  daily. No symptoms of bleeding or shortness of breath reported. -Order CBC to assess current hemoglobin level and response to iron supplementation.

## 2023-07-07 NOTE — Progress Notes (Signed)
Established patient visit   Patient: Morgan Ponce   DOB: Jun 09, 1941   82 y.o. Female  MRN: [id number] Visit Date: 07/07/2023  Today's healthcare provider: [name], MD   Chief Complaint  Patient presents with   Hypertension   Muscle Pain    Wonders if related to alendronate    Subjective     HPI     Muscle Pain    Additional comments: Wonders if related to alendronate       Last edited by [name], MD on 07/07/2023  2:58 PM.       Discussed the use of AI scribe software for clinical note transcription with the patient, who gave verbal consent to proceed.  History of Present Illness   The patient, with a history of anemia, osteoporosis, and hypertension, presents for a follow-up visit. She reports no new health issues since the last visit. The patient has been on iron supplementation for the past three months due to a decrease in hemoglobin levels from 9.8 to 9.1. She denies any symptoms of blood loss such as hematemesis, hematochezia, or hematuria, and also denies any shortness of breath.  The patient also reports a recent episode of severe leg pain that occurred the day after taking her weekly dose of Alendronate. The pain, described as severe and located in the front of the leg, made it difficult for the patient to bend over. The pain resolved on its own after two days. The patient is questioning the necessity of the Alendronate due to this incident.  The patient also mentions receiving a tetanus and shingles vaccine a week prior to the visit. She is considering getting a flu shot and is inquiring about the appropriate timing for this. She also expresses curiosity about a medication she saw advertised on television, but it is not clear which medication she is referring to.         Past Medical History:  Diagnosis Date   Arthritis    Dysrhythmia    HOH (hard of hearing)    AIDS   Hyperlipidemia    Hypertension    RESTARTED BP  MED SINCE FIRST CATARACT   Pain    RIGHT KNEE   Skin cancer of forehead     Medications: Outpatient Medications Prior to Visit  Medication Sig   SHINGRIX injection    alendronate (FOSAMAX) 70 MG tablet TAKE 1 TABLET (70 MG TOTAL) BY MOUTH EVERY 7 DAYS WITH FULL GLASS WATER ON EMPTY STOMACH   aspirin 81 MG tablet Take 81 mg at bedtime by mouth.    hydrALAZINE (APRESOLINE) 10 MG tablet Take 1 tablet (10 mg total) by mouth 2 (two) times daily.   losartan (COZAAR) 50 MG tablet Take 1 tablet (50 mg total) by mouth in the morning and at bedtime.   Polyvinyl Alcohol-Povidone (REFRESH OP) Place 1 drop 5 (five) times daily as needed into both eyes (DRY EYES).   rosuvastatin (CRESTOR) 5 MG tablet TAKE 1 TABLET (5 MG TOTAL) BY MOUTH DAILY.   Vitamin D, Cholecalciferol, 1000 UNITS TABS Take 1 tablet by mouth daily.   No facility-administered medications prior to visit.    Review of Systems  Last CBC Lab Results  Component Value Date   WBC 5.0 04/02/2023   HGB 9.4 (L) 04/02/2023   HCT 30.9 (L) 04/02/2023   MCV 87 04/02/2023   MCH 26.6 04/02/2023   RDW 14.5 04/02/2023   PLT 252 04/02/2023  Objective    BP (!) 140/92 (BP Location: Right Arm, Patient Position: Sitting, Cuff Size: Normal)   Pulse 92   Ht 5\' 3"  (1.6 m)   Wt 108 lb 1.6 oz (49 kg)   SpO2 97%   BMI 19.15 kg/m  BP Readings from Last 3 Encounters:  07/07/23 (!) 140/92  04/02/23 (!) 144/75  02/12/23 (!) 173/79   Wt Readings from Last 3 Encounters:  07/07/23 108 lb 1.6 oz (49 kg)  04/02/23 107 lb 14.4 oz (48.9 kg)  02/12/23 107 lb 1.6 oz (48.6 kg)       Physical Exam Vitals reviewed.  Constitutional:      General: She is not in acute distress.    Appearance: Normal appearance. She is not ill-appearing, toxic-appearing or diaphoretic.     Comments: Thin appearing elderly female in NAD   Eyes:     Conjunctiva/sclera: Conjunctivae normal.  Cardiovascular:     Rate and Rhythm: Normal rate and regular  rhythm.     Pulses: Normal pulses.     Heart sounds: Normal heart sounds. No murmur heard.    No friction rub. No gallop.  Pulmonary:     Effort: Pulmonary effort is normal. No respiratory distress.     Breath sounds: Normal breath sounds. No stridor. No wheezing, rhonchi or rales.  Abdominal:     General: Bowel sounds are normal. There is no distension.     Palpations: Abdomen is soft.     Tenderness: There is no abdominal tenderness.  Musculoskeletal:     Right lower leg: No edema.     Left lower leg: No edema.  Skin:    Findings: No erythema or rash.  Neurological:     Mental Status: She is alert and oriented to person, place, and time.       No results found for any visits on 07/07/23.  Assessment & Plan     Problem List Items Addressed This Visit     Anemia    Previously low hemoglobin (9.1) with patient started on Ferrous Sulfate 65mg  daily. No symptoms of bleeding or shortness of breath reported. -Order CBC to assess current hemoglobin level and response to iron supplementation.      Relevant Orders   CBC with Differential/Platelet   Hypertension - Primary    Blood pressure slightly elevated at 140/92, but improved from previous readings. No new symptoms reported. Chronic   -Continue current antihypertensive regimen of Hydralazine 10mg  twice daily and Losartan 50mg  daily      OP (osteoporosis)    Patient on Alendronate 70mg  weekly, but reporting leg pain after medication. DEXA scan shows osteoporosis with a score of -2.5. -Hold Alendronate for one month to assess if leg pain resolves. -Consider Prolia infusion if patient cannot tolerate Alendronate and DEXA scan shows significant osteoporosis.            General Health Maintenance / Followup Plans -Administer influenza vaccine today. -Plan for RSV and COVID vaccines, with at least 1-2 weeks between each vaccine. -Continue Aspirin 81mg  daily and Vitamin D 1000 units daily for bone health.          Return in about 1 month (around 08/07/2023) for anemia, flu vax, osteoporosis.         [name], MD  [facility] [phone] (phone) [phone] (fax)  [facility]

## 2023-07-07 NOTE — Assessment & Plan Note (Signed)
Blood pressure slightly elevated at 140/92, but improved from previous readings. No new symptoms reported. Chronic   -Continue current antihypertensive regimen of Hydralazine 10mg  twice daily and Losartan 50mg  daily

## 2023-07-07 NOTE — Assessment & Plan Note (Signed)
Patient on Alendronate 70mg  weekly, but reporting leg pain after medication. DEXA scan shows osteoporosis with a score of -2.5. -Hold Alendronate for one month to assess if leg pain resolves. -Consider Prolia infusion if patient cannot tolerate Alendronate and DEXA scan shows significant osteoporosis.

## 2023-07-08 ENCOUNTER — Other Ambulatory Visit: Payer: Self-pay | Admitting: Family Medicine

## 2023-07-08 LAB — CBC WITH DIFFERENTIAL/PLATELET
Basophils Absolute: 0 10*3/uL (ref 0.0–0.2)
Basos: 1 %
EOS (ABSOLUTE): 0.3 10*3/uL (ref 0.0–0.4)
Eos: 6 %
Hematocrit: 39 % (ref 34.0–46.6)
Hemoglobin: 12.4 g/dL (ref 11.1–15.9)
Immature Grans (Abs): 0 10*3/uL (ref 0.0–0.1)
Immature Granulocytes: 1 %
Lymphocytes Absolute: 1.4 10*3/uL (ref 0.7–3.1)
Lymphs: 25 %
MCH: 30.4 pg (ref 26.6–33.0)
MCHC: 31.8 g/dL (ref 31.5–35.7)
MCV: 96 fL (ref 79–97)
Monocytes Absolute: 0.5 10*3/uL (ref 0.1–0.9)
Monocytes: 9 %
Neutrophils Absolute: 3.4 10*3/uL (ref 1.4–7.0)
Neutrophils: 58 %
Platelets: 204 10*3/uL (ref 150–450)
RBC: 4.08 x10E6/uL (ref 3.77–5.28)
RDW: 15.4 % (ref 11.7–15.4)
WBC: 5.7 10*3/uL (ref 3.4–10.8)

## 2023-08-05 NOTE — Progress Notes (Unsigned)
      Established patient visit   Patient: Morgan Ponce   DOB: 10-23-1940   82 y.o. Female  MRN: [id number] Visit Date: 08/06/2023  Today's healthcare provider: [name], MD   No chief complaint on file.  Subjective       Discussed the use of AI scribe software for clinical note transcription with the patient, who gave verbal consent to proceed.  History of Present Illness             Past Medical History:  Diagnosis Date   Arthritis    Dysrhythmia    HOH (hard of hearing)    AIDS   Hyperlipidemia    Hypertension    RESTARTED BP MED SINCE FIRST CATARACT   Pain    RIGHT KNEE   Skin cancer of forehead     Medications: Outpatient Medications Prior to Visit  Medication Sig   alendronate (FOSAMAX) 70 MG tablet TAKE 1 TABLET (70 MG TOTAL) BY MOUTH EVERY 7 DAYS WITH FULL GLASS WATER ON EMPTY STOMACH   aspirin 81 MG tablet Take 81 mg at bedtime by mouth.    ferrous sulfate 324 MG TBEC Take 324 mg by mouth daily with breakfast.   hydrALAZINE (APRESOLINE) 10 MG tablet Take 1 tablet (10 mg total) by mouth 2 (two) times daily.   losartan (COZAAR) 50 MG tablet Take 1 tablet (50 mg total) by mouth in the morning and at bedtime.   Polyvinyl Alcohol-Povidone (REFRESH OP) Place 1 drop 5 (five) times daily as needed into both eyes (DRY EYES).   rosuvastatin (CRESTOR) 5 MG tablet TAKE 1 TABLET (5 MG TOTAL) BY MOUTH DAILY.   SHINGRIX injection    Vitamin D, Cholecalciferol, 1000 UNITS TABS Take 1 tablet by mouth daily.   No facility-administered medications prior to visit.    Review of Systems  {Insert previous labs (optional):23779} {See past labs  Heme  Chem  Endocrine  Serology  Results Review (optional):1}   Objective    There were no vitals taken for this visit. {Insert last BP/Wt (optional):23777}{See vitals history (optional):1}      Physical Exam  ***  No results found for any visits on 08/06/23.  Assessment & Plan     Problem List  Items Addressed This Visit       Musculoskeletal and Integument   OP (osteoporosis)     Other   Anemia - Primary    Assessment and Plan              No follow-ups on file.         [name], MD  [facility] [phone] (phone) [phone] (fax)  [facility]

## 2023-08-06 ENCOUNTER — Ambulatory Visit
Admission: RE | Admit: 2023-08-06 | Discharge: 2023-08-06 | Disposition: A | Discharge status: Home / Self Care | Payer: Medicare HMO | Source: Ambulatory Visit | Attending: Family Medicine

## 2023-08-06 ENCOUNTER — Ambulatory Visit
Admission: RE | Admit: 2023-08-06 | Discharge: 2023-08-06 | Disposition: A | Discharge status: Home / Self Care | Payer: Medicare HMO | Attending: Family Medicine | Admitting: Family Medicine

## 2023-08-06 ENCOUNTER — Ambulatory Visit (INDEPENDENT_AMBULATORY_CARE_PROVIDER_SITE_OTHER): Payer: Medicare HMO | Admitting: Family Medicine

## 2023-08-06 ENCOUNTER — Encounter: Payer: Self-pay | Admitting: Family Medicine

## 2023-08-06 VITALS — BP 164/78 | HR 76 | Ht 63.0 in | Wt 109.0 lb

## 2023-08-06 DIAGNOSIS — M810 Age-related osteoporosis without current pathological fracture: Secondary | ICD-10-CM | POA: Diagnosis not present

## 2023-08-06 DIAGNOSIS — M79604 Pain in right leg: Secondary | ICD-10-CM | POA: Insufficient documentation

## 2023-08-06 DIAGNOSIS — M79605 Pain in left leg: Secondary | ICD-10-CM | POA: Insufficient documentation

## 2023-08-06 DIAGNOSIS — Z23 Encounter for immunization: Secondary | ICD-10-CM

## 2023-08-06 DIAGNOSIS — I4891 Unspecified atrial fibrillation: Secondary | ICD-10-CM | POA: Diagnosis not present

## 2023-08-06 DIAGNOSIS — M170 Bilateral primary osteoarthritis of knee: Secondary | ICD-10-CM | POA: Diagnosis not present

## 2023-08-06 DIAGNOSIS — D649 Anemia, unspecified: Secondary | ICD-10-CM | POA: Diagnosis not present

## 2023-08-06 DIAGNOSIS — I4892 Unspecified atrial flutter: Secondary | ICD-10-CM

## 2023-08-06 DIAGNOSIS — E7800 Pure hypercholesterolemia, unspecified: Secondary | ICD-10-CM

## 2023-08-06 DIAGNOSIS — M818 Other osteoporosis without current pathological fracture: Secondary | ICD-10-CM

## 2023-08-06 DIAGNOSIS — I10 Essential (primary) hypertension: Secondary | ICD-10-CM

## 2023-08-06 MED ORDER — ROSUVASTATIN CALCIUM 5 MG PO TABS
5.0000 mg | ORAL_TABLET | ORAL | Status: DC
Start: 2023-08-06 — End: 2023-09-02

## 2023-08-06 NOTE — Patient Instructions (Signed)
VISIT SUMMARY:  Today, we discussed your ongoing health concerns, including leg pain, osteoporosis, hyperlipidemia, hypertension, and anemia. We reviewed your current medications and made some adjustments to help manage your symptoms better. We also discussed further investigations and treatments to ensure your health remains stable.  YOUR PLAN:  -OSTEOPOROSIS: Osteoporosis is a condition where bones become weak and brittle. We are holding off on Fosamax, which may be contributing to your leg pain, and considering a referral to an endocrinologist to evaluate the use of Prolia. We will also order x-rays of your legs to check for any fractures.  -BILATERAL LEG PAIN: Your leg pain may be related to your osteoporosis treatment. We are stopping Fosamax for now and will consider other treatments based on the x-ray results and endocrinologist's evaluation.  -HYPERLIPIDEMIA: Hyperlipidemia means having high levels of fats in your blood. We are reducing your Crestor dosage to three times a week to see if it helps with your leg pain. We may also measure your CK levels to check for muscle breakdown.  -HYPERTENSION: Hypertension is high blood pressure. Your blood pressure readings at home are normal, so we will continue your current medications, Hydralazine and Losartan, and ask you to keep monitoring your blood pressure at home.  -ANEMIA: Anemia is a condition where you don't have enough healthy red blood cells. Your hemoglobin levels have improved with iron therapy, so we will continue the iron supplements every other day.  -GENERAL HEALTH MAINTENANCE: We administered your influenza vaccine today to help protect you from the flu.  INSTRUCTIONS:  1. Follow up with an endocrinologist for evaluation and possible Prolia use for osteoporosis. 2. Get bilateral lower extremity x-rays to check for potential fractures. 3. Reduce Crestor dosage to three times a week (Monday, Wednesday, Friday). 4. Continue  monitoring your blood pressure at home and maintain your current antihypertensive medications. 5. Continue iron therapy every other day for anemia. 6. Return to the clinic for any new or worsening symptoms.

## 2023-08-07 NOTE — Assessment & Plan Note (Signed)
  Hyperlipidemia On Crestor 5mg  every other day. Considering if statin therapy may be contributing to leg pain. -Reduce Crestor to three times per week (Monday, Wednesday, Friday). -Consider measuring CK to assess for muscle breakdown.

## 2023-08-07 NOTE — Assessment & Plan Note (Signed)
Osteoporosis,chronic Bilateral leg pain, possibly related to Fosamax use. Fosamax held with some improvement in symptoms.  -Refer to endocrinology for evaluation and possible Prolia use in setting of inability to tolerate fosamax  -Order bilateral lower extremity x-rays to assess for potential fractures contributing to bilateral leg pain

## 2023-08-07 NOTE — Assessment & Plan Note (Signed)
Anemia,chronic Responded well to iron therapy with improvement in hemoglobin from 9.4 to 12.4. -Continue iron therapy (ferrous sulfate 325mg ) every other day.

## 2023-08-07 NOTE — Assessment & Plan Note (Signed)
Chronic  Stable  Irregular rhythm, normal rate in clinic today

## 2023-08-07 NOTE — Assessment & Plan Note (Signed)
Hypertension,chronic Elevated blood pressures in clinic but normal at home based on BP records. Pt with hx of white coat HTN, chronically elevated BP in clinic   On Hydralazine 10mg  and Losartan 50mg  daily. -Continue current antihypertensive regimen and monitoring blood pressure at home.

## 2023-08-07 NOTE — Assessment & Plan Note (Signed)
Improved Chronic issue  Adjusted dosing of statin to three days weekly to see if this pain continues to improve  Held fosamax for 1 month with improvement, will DC fosamax for now  Previously measured CK was WNL  Ordered bilateral tibia xrays, will hold off on knee xrays (pt mentioned chronic right knee pain)

## 2023-08-14 DIAGNOSIS — Z0100 Encounter for examination of eyes and vision without abnormal findings: Secondary | ICD-10-CM | POA: Diagnosis not present

## 2023-08-26 DIAGNOSIS — M810 Age-related osteoporosis without current pathological fracture: Secondary | ICD-10-CM | POA: Diagnosis not present

## 2023-09-01 NOTE — Progress Notes (Unsigned)
      Established patient visit   Patient: Morgan Ponce   DOB: 1941-08-01   82 y.o. Female  MRN: [id number] Visit Date: 09/02/2023  Today's healthcare provider: [name], MD   No chief complaint on file.  Subjective       Discussed the use of AI scribe software for clinical note transcription with the patient, who gave verbal consent to proceed.  History of Present Illness             Past Medical History:  Diagnosis Date   Arthritis    Dysrhythmia    HOH (hard of hearing)    AIDS   Hyperlipidemia    Hypertension    RESTARTED BP MED SINCE FIRST CATARACT   Pain    RIGHT KNEE   Skin cancer of forehead     Medications: Outpatient Medications Prior to Visit  Medication Sig   aspirin 81 MG tablet Take 81 mg at bedtime by mouth.    ferrous sulfate 324 MG TBEC Take 324 mg by mouth daily with breakfast. Taking every other day.   hydrALAZINE (APRESOLINE) 10 MG tablet Take 1 tablet (10 mg total) by mouth 2 (two) times daily.   losartan (COZAAR) 50 MG tablet Take 1 tablet (50 mg total) by mouth in the morning and at bedtime.   Polyvinyl Alcohol-Povidone (REFRESH OP) Place 1 drop 5 (five) times daily as needed into both eyes (DRY EYES).   rosuvastatin (CRESTOR) 5 MG tablet Take 1 tablet (5 mg total) by mouth every Monday, Wednesday, and Friday.   SHINGRIX injection    Vitamin D, Cholecalciferol, 1000 UNITS TABS Take 1 tablet by mouth daily.   No facility-administered medications prior to visit.    Review of Systems  {Insert previous labs (optional):23779} {See past labs  Heme  Chem  Endocrine  Serology  Results Review (optional):1}   Objective    There were no vitals taken for this visit. {Insert last BP/Wt (optional):23777}{See vitals history (optional):1}    Physical Exam  ***  No results found for any visits on 09/02/23.  Assessment & Plan     Problem List Items Addressed This Visit   None   Assessment and Plan               No follow-ups on file.         [name], MD  [facility] [phone] (phone) [phone] (fax)  [facility]

## 2023-09-02 ENCOUNTER — Encounter: Payer: Self-pay | Admitting: Family Medicine

## 2023-09-02 ENCOUNTER — Ambulatory Visit (INDEPENDENT_AMBULATORY_CARE_PROVIDER_SITE_OTHER): Payer: Medicare HMO | Admitting: Family Medicine

## 2023-09-02 VITALS — BP 150/82 | HR 84 | Resp 16 | Ht 63.0 in | Wt 109.0 lb

## 2023-09-02 DIAGNOSIS — I4892 Unspecified atrial flutter: Secondary | ICD-10-CM

## 2023-09-02 DIAGNOSIS — M170 Bilateral primary osteoarthritis of knee: Secondary | ICD-10-CM

## 2023-09-02 DIAGNOSIS — E7800 Pure hypercholesterolemia, unspecified: Secondary | ICD-10-CM | POA: Diagnosis not present

## 2023-09-02 DIAGNOSIS — I10 Essential (primary) hypertension: Secondary | ICD-10-CM | POA: Diagnosis not present

## 2023-09-02 DIAGNOSIS — I4891 Unspecified atrial fibrillation: Secondary | ICD-10-CM | POA: Diagnosis not present

## 2023-09-02 DIAGNOSIS — M818 Other osteoporosis without current pathological fracture: Secondary | ICD-10-CM

## 2023-09-02 NOTE — Patient Instructions (Addendum)
VISIT SUMMARY:  Today, we reviewed your chronic conditions, including hypertension, osteoporosis, and hypercholesterolemia. We discussed your current medications, recent symptoms, and future plans for managing your health. We also talked about the importance of home blood pressure monitoring and stress management.  YOUR PLAN:  -HYPERTENSION: Hypertension, or high blood pressure, is when the force of the blood against your artery walls is too high. Your blood pressure readings at home are normal, but they are elevated in the clinic. Continue taking Hydralazine 2mg  twice daily and Losartan 50mg  twice daily. Please monitor your blood pressure at home and we will recheck it at your next visit.  -OSTEOARTHRITIS: Osteoarthritis is a condition that causes the joints to become painful and stiff. You have mild osteoarthritis in your knees. Use Voltaren gel as needed for knee pain and continue using arthritis Tylenol and a heating pad as needed.  -OSTEOPOROSIS: Osteoporosis is a condition where bones become weak and brittle. You are awaiting insurance approval to start Prolia. Continue taking vitamin D 1000 units daily and proceed with your bone density scan scheduled for 11/10/2023. We will provide you with information on Prolia's side effects.  -HYPERLIPIDEMIA: Hyperlipidemia is when you have high levels of fats (lipids) in your blood. We have decided to discontinue Rosuvastatin 5mg  three times a week to see if it helps with your leg pain. We will reassess your lipid levels at your next visit.  -GENERAL HEALTH MAINTENANCE: We discussed the importance of stress management to help prevent blood pressure spikes and the continuation of your daily aspirin therapy. Please continue taking aspirin 81mg  daily and practice stress management techniques.  INSTRUCTIONS:  Please follow up on 01/05/2024 for your wellness visit.   Denosumab Injection (Osteoporosis) What is this medication? DENOSUMAB (den oh SUE  mab) prevents and treats osteoporosis. It works by Interior and spatial designer stronger and less likely to break (fracture). It is a monoclonal antibody. This medicine may be used for other purposes; ask your health care provider or pharmacist if you have questions. COMMON BRAND NAME(S): Prolia What should I tell my care team before I take this medication? They need to know if you have any of these conditions: Dental or gum disease Had thyroid or parathyroid (glands located in neck) surgery Having dental surgery or a tooth pulled Kidney disease Low levels of calcium in the blood On dialysis Poor nutrition Thyroid disease Trouble absorbing nutrients from your food An unusual or allergic reaction to denosumab, other medications, foods, dyes, or preservatives Pregnant or trying to get pregnant Breastfeeding How should I use this medication? This medication is injected under the skin. It is given by your care team in a hospital or clinic setting. A special MedGuide will be given to you before each treatment. Be sure to read this information carefully each time. Talk to your care team about the use of this medication in children. Special care may be needed. Overdosage: If you think you have taken too much of this medicine contact a poison control center or emergency room at once. NOTE: This medicine is only for you. Do not share this medicine with others. What if I miss a dose? Keep appointments for follow-up doses. It is important not to miss your dose. Call your care team if you are unable to keep an appointment. What may interact with this medication? Do not take this medication with any of the following: Other medications that contain denosumab This medication may also interact with the following: Medications that lower your chance of fighting  infection Steroid medications, such as prednisone or cortisone This list may not describe all possible interactions. Give your health care provider a list  of all the medicines, herbs, non-prescription drugs, or dietary supplements you use. Also tell them if you smoke, drink alcohol, or use illegal drugs. Some items may interact with your medicine. What should I watch for while using this medication? Your condition will be monitored carefully while you are receiving this medication. You may need blood work done while taking this medication. This medication may increase your risk of getting an infection. Call your care team for advice if you get a fever, chills, sore throat, or other symptoms of a cold or flu. Do not treat yourself. Try to avoid being around people who are sick. Tell your dentist and dental surgeon that you are taking this medication. You should not have major dental surgery while on this medication. See your dentist to have a dental exam and fix any dental problems before starting this medication. Take good care of your teeth while on this medication. Make sure you see your dentist for regular follow-up appointments. This medication may cause low levels of calcium in your body. The risk of severe side effects is increased in people with kidney disease. Your care team may prescribe calcium and vitamin D to help prevent low calcium levels while you take this medication. It is important to take calcium and vitamin D as directed by your care team. Talk to your care team if you may be pregnant. Serious birth defects may occur if you take this medication during pregnancy and for 5 months after the last dose. You will need a negative pregnancy test before starting this medication. Contraception is recommended while taking this medication and for 5 months after the last dose. Your care team can help you find the option that works for you. Talk to your care team before breastfeeding. Changes to your treatment plan may be needed. What side effects may I notice from receiving this medication? Side effects that you should report to your care team as soon  as possible: Allergic reactions--skin rash, itching, hives, swelling of the face, lips, tongue, or throat Infection--fever, chills, cough, sore throat, wounds that don't heal, pain or trouble when passing urine, general feeling of discomfort or being unwell Low calcium level--muscle pain or cramps, confusion, tingling, or numbness in the hands or feet Osteonecrosis of the jaw--pain, swelling, or redness in the mouth, numbness of the jaw, poor healing after dental work, unusual discharge from the mouth, visible bones in the mouth Severe bone, joint, or muscle pain Skin infection--skin redness, swelling, warmth, or pain Side effects that usually do not require medical attention (report these to your care team if they continue or are bothersome): Back pain Headache Joint pain Muscle pain Pain in the hands, arms, legs, or feet Runny or stuffy nose Sore throat This list may not describe all possible side effects. Call your doctor for medical advice about side effects. You may report side effects to FDA at 1-800-FDA-1088. Where should I keep my medication? This medication is given in a hospital or clinic. It will not be stored at home. NOTE: This sheet is a summary. It may not cover all possible information. If you have questions about this medicine, talk to your doctor, pharmacist, or health care provider.  2024 Elsevier/Gold Standard (2022-10-22 00:00:00)

## 2023-09-02 NOTE — Assessment & Plan Note (Signed)
Osteoarthritis in the right knee with mild involvement in the left knee. Managed with arthritis Tylenol and heating pad. Discussed use of over-the-counter Voltaren gel. Chronic  - Use Voltaren gel as needed for knee pain - Continue arthritis Tylenol and heating pad as needed

## 2023-09-02 NOTE — Assessment & Plan Note (Signed)
Chronic hypertension with elevated clinic readings (154/88 mmHg) but normal home readings. Managed with hydralazine 2 mg twice daily and losartan 50 mg twice daily. No recent palpitations or arrhythmias. Emphasized home monitoring and stress management. - Continue hydralazine 2 mg twice daily - Continue losartan 50 mg twice daily - Monitor blood pressure at home - Recheck blood pressure at next visit

## 2023-09-02 NOTE — Assessment & Plan Note (Signed)
Osteoporosis with intolerance to Fosamax. Referred to endocrinology and scheduled to start Prolia pending insurance approval. Last bone density scan on 11/05/2021, next scan scheduled for 11/10/2023. Emphasized fracture prevention. - Await insurance approval for Prolia - Continue vitamin D 1000 units daily - Proceed with bone density scan on 11/10/2023 - Provided literature on Prolia side effects - continue follow up with endocrinology as scheduled

## 2023-10-23 ENCOUNTER — Other Ambulatory Visit: Payer: Self-pay | Admitting: Family Medicine

## 2023-10-23 DIAGNOSIS — Z1231 Encounter for screening mammogram for malignant neoplasm of breast: Secondary | ICD-10-CM

## 2023-10-25 ENCOUNTER — Other Ambulatory Visit: Payer: Self-pay | Admitting: Family Medicine

## 2023-10-25 DIAGNOSIS — I739 Peripheral vascular disease, unspecified: Secondary | ICD-10-CM | POA: Diagnosis not present

## 2023-10-25 DIAGNOSIS — I10 Essential (primary) hypertension: Secondary | ICD-10-CM

## 2023-10-25 DIAGNOSIS — N182 Chronic kidney disease, stage 2 (mild): Secondary | ICD-10-CM | POA: Diagnosis not present

## 2023-10-25 DIAGNOSIS — Z008 Encounter for other general examination: Secondary | ICD-10-CM | POA: Diagnosis not present

## 2023-10-25 DIAGNOSIS — I129 Hypertensive chronic kidney disease with stage 1 through stage 4 chronic kidney disease, or unspecified chronic kidney disease: Secondary | ICD-10-CM | POA: Diagnosis not present

## 2023-10-25 DIAGNOSIS — I4891 Unspecified atrial fibrillation: Secondary | ICD-10-CM | POA: Diagnosis not present

## 2023-10-25 DIAGNOSIS — M810 Age-related osteoporosis without current pathological fracture: Secondary | ICD-10-CM | POA: Diagnosis not present

## 2023-10-25 DIAGNOSIS — Z85828 Personal history of other malignant neoplasm of skin: Secondary | ICD-10-CM | POA: Diagnosis not present

## 2023-10-25 DIAGNOSIS — E785 Hyperlipidemia, unspecified: Secondary | ICD-10-CM | POA: Diagnosis not present

## 2023-10-25 DIAGNOSIS — M1990 Unspecified osteoarthritis, unspecified site: Secondary | ICD-10-CM | POA: Diagnosis not present

## 2023-10-25 DIAGNOSIS — Z823 Family history of stroke: Secondary | ICD-10-CM | POA: Diagnosis not present

## 2023-10-25 DIAGNOSIS — D6869 Other thrombophilia: Secondary | ICD-10-CM | POA: Diagnosis not present

## 2023-10-25 DIAGNOSIS — Z8249 Family history of ischemic heart disease and other diseases of the circulatory system: Secondary | ICD-10-CM | POA: Diagnosis not present

## 2023-10-25 DIAGNOSIS — R32 Unspecified urinary incontinence: Secondary | ICD-10-CM | POA: Diagnosis not present

## 2023-11-10 DIAGNOSIS — M810 Age-related osteoporosis without current pathological fracture: Secondary | ICD-10-CM | POA: Diagnosis not present

## 2023-11-24 DIAGNOSIS — M810 Age-related osteoporosis without current pathological fracture: Secondary | ICD-10-CM | POA: Diagnosis not present

## 2023-12-09 DIAGNOSIS — M810 Age-related osteoporosis without current pathological fracture: Secondary | ICD-10-CM | POA: Diagnosis not present

## 2023-12-29 ENCOUNTER — Ambulatory Visit
Admission: RE | Admit: 2023-12-29 | Discharge: 2023-12-29 | Disposition: A | Discharge status: Home / Self Care | Payer: Medicare HMO | Source: Ambulatory Visit | Attending: Family Medicine | Admitting: Family Medicine

## 2023-12-29 DIAGNOSIS — Z1231 Encounter for screening mammogram for malignant neoplasm of breast: Secondary | ICD-10-CM | POA: Diagnosis not present

## 2024-01-05 NOTE — Progress Notes (Signed)
 Called and was able to inform the pt of her lab result per Dr Roxan Hockey, She verbally stated she understood and had no other questions

## 2024-02-16 ENCOUNTER — Ambulatory Visit (INDEPENDENT_AMBULATORY_CARE_PROVIDER_SITE_OTHER): Admitting: Family Medicine

## 2024-02-16 ENCOUNTER — Encounter: Payer: Self-pay | Admitting: Family Medicine

## 2024-02-16 VITALS — BP 145/81 | HR 79 | Ht 63.0 in | Wt 110.0 lb

## 2024-02-16 DIAGNOSIS — D649 Anemia, unspecified: Secondary | ICD-10-CM

## 2024-02-16 DIAGNOSIS — Z0000 Encounter for general adult medical examination without abnormal findings: Secondary | ICD-10-CM

## 2024-02-16 DIAGNOSIS — Z0001 Encounter for general adult medical examination with abnormal findings: Secondary | ICD-10-CM | POA: Diagnosis not present

## 2024-02-16 DIAGNOSIS — E7800 Pure hypercholesterolemia, unspecified: Secondary | ICD-10-CM | POA: Diagnosis not present

## 2024-02-16 DIAGNOSIS — I10 Essential (primary) hypertension: Secondary | ICD-10-CM

## 2024-02-16 DIAGNOSIS — Z131 Encounter for screening for diabetes mellitus: Secondary | ICD-10-CM

## 2024-02-16 NOTE — Progress Notes (Signed)
 Subjective:   Morgan Ponce is a 83 y.o. female who presents for Medicare Annual (Subsequent) preventive examination.  Visit Complete: In person  Patient Medicare AWV questionnaire was completed by the patient on 02/16/24; I have confirmed that all information answered by patient is correct and no changes since this date.  Cardiac Risk Factors include: hypertension  Discussed the use of AI scribe software for clinical note transcription with the patient, who gave verbal consent to proceed.  History of Present Illness Morgan Ponce "Morgan Ponce" is an 83 year old female who presents for an annual physical and Medicare wellness visit.  She has a history of atrial fibrillation, anemia, high cholesterol, hypertension, and osteoporosis. She is currently on losartan  50 mg for blood pressure management and hydralazine  10 mg twice a day. She also takes iron tablets every other day for anemia and receives an annual Reclast  IV infusion for osteoporosis, which she tolerates well. Additionally, she takes vitamin D  1000 units daily.  Her diet typically includes cereal without milk and coffee for breakfast, with a larger meal at lunch consisting of chicken, fish, or pork, and a variety of vegetables. She does not consume beef. She engages in regular walking within her retirement home and participates in some activities offered there.  She is up to date on vaccinations, including COVID, shingles, tetanus, pneumococcal, and RSV vaccines. She has a living will in place and resides in a retirement home.  She expresses concern about an incorrect entry in her medical record regarding an HIV diagnosis, which she disputes as she has never been tested or diagnosed with HIV.  No recent changes in health or new symptoms. No fever, cough, or other acute symptoms.      Objective:     Today's Vitals   02/16/24 1100  BP: (!) 145/81  Pulse: 79  SpO2: 98%  Weight: 110 lb (49.9 kg)  Height: 5\' 3"  (1.6 m)   PainSc: 0-No pain   Body mass index is 19.49 kg/m.     12/30/2022    4:07 PM 07/24/2022   10:26 AM 12/18/2020    9:21 AM 11/16/2019    9:54 AM 11/10/2018   10:07 AM 09/15/2017    1:29 PM 09/02/2017   11:25 AM  Advanced Directives  Does Patient Have a Medical Advance Directive? Yes No Yes Yes Yes Yes Yes  Type of Best boy of [place];Living will Healthcare Power of [place];Living will Healthcare Power of [place];Living will Healthcare Power of [place];Living will Healthcare Power of [place];Living will  Copy of Healthcare Power of Attorney in Chart?   Yes - validated most recent copy scanned in chart (See row information) No - copy requested No - copy requested No - copy requested No - copy requested  Would patient like information on creating a medical advance directive?  No - Patient declined         Current Medications (verified) Outpatient Encounter Medications as of 02/16/2024  Medication Sig   aspirin 81 MG tablet Take 81 mg at bedtime by mouth.    COMIRNATY syringe    ferrous sulfate 324 MG TBEC Take 324 mg by mouth daily with breakfast. Taking every other day.   hydrALAZINE  (APRESOLINE ) 10 MG tablet TAKE 1 TABLET BY MOUTH TWICE A DAY   losartan  (COZAAR ) 50 MG tablet TAKE 1 TABLET (50 MG) BY MOUTH IN THE MORNING AND AT BEDTIME   Polyvinyl Alcohol-Povidone (REFRESH OP) Place 1 drop 5 (five) times daily as needed  into both eyes (DRY EYES).   Vitamin D , Cholecalciferol, 1000 UNITS TABS Take 1 tablet by mouth daily.   Zoledronic  Acid (RECLAST  IV) Inject into the vein.   SHINGRIX injection  (Patient not taking: Reported on 02/16/2024)   No facility-administered encounter medications on file as of 02/16/2024.    Allergies (verified) Cardizem [diltiazem] and Diltiazem hcl   History: Past Medical History:  Diagnosis Date   Arthritis    Dysrhythmia    HOH (hard of hearing)    AIDS   Hyperlipidemia    Hypertension    RESTARTED BP MED SINCE FIRST  CATARACT   Pain    RIGHT KNEE   Skin cancer of forehead    Past Surgical History:  Procedure Laterality Date   BREAST BIOPSY Right 08/22/2014   benign. Done by Dr Marlene Simas   BREAST EXCISIONAL BIOPSY Right 1972   benign   CATARACT EXTRACTION W/PHACO Right 05/06/2017   Procedure: CATARACT EXTRACTION PHACO AND INTRAOCULAR LENS PLACEMENT (IOC);  Surgeon: Clair Crews, MD;  Location: [facility] ORS;  Service: Ophthalmology;  Laterality: Right;  US  00:40 AP% 17.6 CDE 7.16 Fluid pack lot # [id number]   CATARACT EXTRACTION W/PHACO Left 09/02/2017   Procedure: CATARACT EXTRACTION PHACO AND INTRAOCULAR LENS PLACEMENT (IOC);  Surgeon: Clair Crews, MD;  Location: [facility] ORS;  Service: Ophthalmology;  Laterality: Left;  US  00:39 AP% 15.0 CDE 5.91 Fluid pack lot # [id number]   CHOLECYSTECTOMY  2013   COLONOSCOPY N/A 02/06/2015   Procedure: COLONOSCOPY;  Surgeon: Stephens Eis, MD;  Location: [facility] ENDOSCOPY;  Service: Gastroenterology;  Laterality: N/A;   HYSTEROSCOPY  1999   Family History  Problem Relation Age of Onset   Hypertension Mother    Stroke Mother    Heart disease Father    Hypertension Father    Hypertension Sister    Breast cancer Sister 107   Heart attack Brother    Social History   Socioeconomic History   Marital status: Widowed    Spouse name: Not on file   Number of children: 0   Years of education: Not on file   Highest education level: High school graduate  Occupational History   Occupation: retired  Tobacco Use   Smoking status: Never   Smokeless tobacco: Never  Vaping Use   Vaping status: Never Used  Substance and Sexual Activity   Alcohol use: No    Alcohol/week: 0.0 standard drinks of alcohol   Drug use: No   Sexual activity: Never  Other Topics Concern   Not on file  Social History Narrative   Not on file   Social Drivers of Health   Financial Resource Strain: Low Risk  (02/16/2024)   Overall Financial Resource Strain (CARDIA)    Difficulty of Paying  Living Expenses: Not hard at all  Food Insecurity: No Food Insecurity (02/16/2024)   Hunger Vital Sign    Worried About Running Out of Food in the Last Year: Never true    Ran Out of Food in the Last Year: Never true  Transportation Needs: No Transportation Needs (02/16/2024)   PRAPARE - Administrator, Civil Service (Medical): No    Lack of Transportation (Non-Medical): No  Physical Activity: Insufficiently Active (12/30/2022)   Exercise Vital Sign    Days of Exercise per Week: 2 days    Minutes of Exercise per Session: 20 min  Stress: No Stress Concern Present (02/16/2024)   [employer] of Occupational Health - Occupational Stress Questionnaire    Feeling  of Stress : Not at all  Social Connections: Moderately Isolated (12/30/2022)   Social Connection and Isolation Panel [NHANES]    Frequency of Communication with Friends and Family: More than three times a week    Frequency of Social Gatherings with Friends and Family: More than three times a week    Attends Religious Services: More than 4 times per year    Active Member of [employer] or Organizations: No    Attends Banker Meetings: Never    Marital Status: Widowed    Tobacco Counseling Counseling given: Not Answered   Clinical Intake:  Pre-visit preparation completed: Yes  Pain : No/denies pain Pain Score: 0-No pain     Nutritional Risks: None Diabetes: No  How often do you need to have someone help you when you read instructions, pamphlets, or other written materials from your doctor or pharmacy?: 1 - Never  Interpreter Needed?: No      Activities of Daily Living    02/16/2024   10:57 AM  In your present state of health, do you have any difficulty performing the following activities:  Hearing? 1  Vision? 1  Difficulty concentrating or making decisions? 0  Walking or climbing stairs? 0  Dressing or bathing? 0  Doing errands, shopping? 0  Preparing Food and eating ? N  Using the Toilet?  N  In the past six months, have you accidently leaked urine? N  Do you have problems with loss of bowel control? N  Managing your Medications? N  Managing your Finances? N  Housekeeping or managing your Housekeeping? N    Patient Care Team: Mimi Alt, MD as PCP - General (Family Medicine) Clair Crews, MD as Referring Physician (Ophthalmology) Dasher, Margette Sheldon, MD (Dermatology) Throat, [place] Ear Nose And  Indicate any recent Medical Services you may have received from other than [facility] providers in the past year (date may be approximate).     Assessment:    This is a routine wellness examination for [place].  Hearing/Vision screen No results found.   Goals Addressed   None    Depression Screen    02/16/2024   11:02 AM 09/02/2023    2:18 PM 12/30/2022    4:05 PM 08/28/2022    2:50 PM 07/01/2022   10:05 AM 12/24/2021    1:52 PM 09/19/2021   10:39 AM  PHQ 2/9 Scores  PHQ - 2 Score 0 0 0 0 0 0 0  PHQ- 9 Score 0 0  0  0 0    Fall Risk    09/02/2023    2:19 PM 12/30/2022    4:01 PM 08/28/2022    2:49 PM 07/31/2022    1:55 PM 07/01/2022   10:05 AM  Fall Risk   Falls in the past year? 0 0 1 1 1   Number falls in past yr: 0 0 0 0 1  Injury with Fall? 0 0 1 0 0  Risk for fall due to :  No Fall Risks No Fall Risks No Fall Risks Other (Comment)  Follow up  Education provided;Falls prevention discussed       MEDICARE RISK AT HOME:    TIMED UP AND GO:  Was the test performed?  No    Cognitive Function:        12/30/2022    4:09 PM 11/16/2019    9:59 AM 11/10/2018   10:15 AM 08/27/2016    9:17 AM  6CIT Screen  What Year?  0 points 0 points  0 points  What month?  0 points 0 points 0 points  What time? 0 points 0 points 0 points 0 points  Count back from 20 0 points 0 points 0 points 0 points  Months in reverse 0 points 0 points 0 points 0 points  Repeat phrase 0 points 0 points 0 points 2 points  Total Score  0 points 0 points 2 points     Immunizations Immunization History  Administered Date(s) Administered   DTaP 07/04/2023   Fluad Quad(high Dose 65+) 06/23/2019, 06/15/2020, 06/20/2021, 07/01/2022   Fluad Trivalent(High Dose 65+) 08/06/2023   Influenza, High Dose Seasonal PF 06/10/2015, 06/14/2016, 07/01/2017, 07/02/2018   PFIZER(Purple Top)SARS-COV-2 Vaccination 10/06/2019, 10/27/2019, 07/05/2020   Pfizer Covid-19 Vaccine Bivalent Booster 61yrs & up 08/12/2022   Pneumococcal Conjugate-13 01/16/2015   Pneumococcal Polysaccharide-23 08/27/2004   Rsv, Bivalent, Protein Subunit Rsvpref,pf Pattricia Bores) 09/14/2022   Tdap 05/27/2011   Zoster Recombinant(Shingrix) 05/02/2023   Zoster, Live 07/06/2013   Zoster, Unspecified 05/02/2023, 07/04/2023    TDAP status: Up to date  Flu Vaccine status: Up to date  Pneumococcal vaccine status: Up to date  Covid-19 vaccine status: Completed vaccines  Qualifies for Shingles Vaccine? No   Zostavax completed No   Shingrix Completed?: Yes  Screening Tests Health Maintenance  Topic Date Due   COVID-19 Vaccine (5 - 2024-25 season) 06/01/2023   INFLUENZA VACCINE  04/30/2024   Medicare Annual Wellness (AWV)  02/15/2025   DEXA SCAN  11/05/2026   DTaP/Tdap/Td (3 - Td or Tdap) 07/03/2033   Pneumonia Vaccine 29+ Years old  Completed   Zoster Vaccines- Shingrix  Completed   HPV VACCINES  Aged Out   Meningococcal B Vaccine  Aged Out    Health Maintenance  Health Maintenance Due  Topic Date Due   COVID-19 Vaccine (5 - 2024-25 season) 06/01/2023    Colorectal cancer screening: No longer required.   Mammogram status: Completed 12/29/23. Repeat every year  Bone Density status: Completed 10/2021. Results reflect: Bone density results: OSTEOPOROSIS. Repeat every 2 years.  Lung Cancer Screening: (Low Dose CT Chest recommended if Age 11-80 years, 20 pack-year currently smoking OR have quit w/in 15years.) does not qualify.   Lung Cancer Screening Referral: n/a  Additional  Screening:  Hepatitis C Screening: does qualify; previously declined screening   Vision Screening: Recommended annual ophthalmology exams for early detection of glaucoma and other disorders of the eye. Is the patient up to date with their annual eye exam?  Yes  Who is the provider or what is the name of the office in which the patient attends annual eye exams? Dr. Justo Opal If pt is not established with a provider, would they like to be referred to a provider to establish care? No .   Dental Screening: Recommended annual dental exams for proper oral hygiene  Diabetic Foot Exam: does not have diabetes   Community Resource Referral / Chronic Care Management: CRR required this visit?  No   CCM required this visit?  No   General: Alert, no acute distress Cardio: Normal S1 and S2, Pulm: CTAB, normal work of breathing ABD: soft, abdomen is not distended, there is no tenderness to palpation, normal BS  Extremities: no LE edema   .pese   Plan:     I have personally reviewed and noted the following in the patient's chart:   Medical and social history Use of alcohol, tobacco or illicit drugs  Current medications and supplements including opioid prescriptions. Patient is not currently taking opioid prescriptions.  Functional ability and status Nutritional status Physical activity Advanced directives List of other physicians Hospitalizations, surgeries, and ER visits in previous 12 months Vitals Screenings to include cognitive, depression, and falls Referrals and appointments  In addition, I have reviewed and discussed with patient certain preventive protocols, quality metrics, and best practice recommendations. A written personalized care plan for preventive services as well as general preventive health recommendations were provided to patient.     Assessment & Plan Hypertension Blood pressure is 145/81 mmHg. She is on losartan  50 mg for management. - Continue losartan  50  mg.  Hyperlipidemia Cholesterol levels will be assessed during the annual wellness visit. Recent food intake may slightly affect triglyceride levels, but testing will proceed. - Order lipid panel.  Osteoporosis She receives annual Reclast  IV infusion with no reported issues. - Continue annual Reclast  IV infusion.  Anemia She takes iron tablets every other day for management. - Continue iron tablets 324 mg every other day.  General Health Maintenance She is up to date on vaccinations including COVID, shingles, tetanus, pneumococcal, RSV, and flu vaccines. Discussed the importance of maintaining vaccination status and potential need for a COVID booster in the fall. - Administer COVID booster in the fall.  Goals of Care She has an advanced directive (living will) and does not wish to update it. She prefers to avoid knee surgery due to risk of worsening her condition.  Follow-up She will return for lab work due to scheduling conflicts today. Labs to include basic metabolic panel, lipid panel, thyroid  studies, and A1c for diabetes screening. - Order basic metabolic panel, lipid panel, thyroid  studies, and A1c for diabetes screening. - Schedule follow-up appointment in six months.  Request for updated record involving concern for HIV status  Patient has requested that insurance remove HIV from her records, unable to see in the record where she was ever tested for HIV, she reports that was filed in March of 2022, I do not see any record of positive HIV screening but do see where she declined HIV screening at prior visit in 2022.   Mimi Alt, MD   02/16/2024   After Visit Summary: (In Person-Declined) Patient declined AVS at this time.

## 2024-02-17 DIAGNOSIS — E7800 Pure hypercholesterolemia, unspecified: Secondary | ICD-10-CM | POA: Diagnosis not present

## 2024-02-17 DIAGNOSIS — Z131 Encounter for screening for diabetes mellitus: Secondary | ICD-10-CM | POA: Diagnosis not present

## 2024-02-17 DIAGNOSIS — I10 Essential (primary) hypertension: Secondary | ICD-10-CM | POA: Diagnosis not present

## 2024-02-17 DIAGNOSIS — D649 Anemia, unspecified: Secondary | ICD-10-CM | POA: Diagnosis not present

## 2024-02-18 ENCOUNTER — Ambulatory Visit: Payer: Self-pay | Admitting: Family Medicine

## 2024-02-18 LAB — BMP8+EGFR
BUN/Creatinine Ratio: 24 (ref 12–28)
BUN: 21 mg/dL (ref 8–27)
CO2: 23 mmol/L (ref 20–29)
Calcium: 9.2 mg/dL (ref 8.7–10.3)
Chloride: 102 mmol/L (ref 96–106)
Creatinine, Ser: 0.88 mg/dL (ref 0.57–1.00)
Glucose: 99 mg/dL (ref 70–99)
Potassium: 4.5 mmol/L (ref 3.5–5.2)
Sodium: 141 mmol/L (ref 134–144)
eGFR: 64 mL/min/{1.73_m2} (ref >59)

## 2024-02-18 LAB — HEMOGLOBIN A1C
Est. average glucose Bld gHb Est-mCnc: 117 mg/dL
Hgb A1c MFr Bld: 5.7 % — ABNORMAL HIGH (ref 4.8–5.6)

## 2024-02-18 LAB — CBC
Hematocrit: 38.5 % (ref 34.0–46.6)
Hemoglobin: 12.7 g/dL (ref 11.1–15.9)
MCH: 32.7 pg (ref 26.6–33.0)
MCHC: 33 g/dL (ref 31.5–35.7)
MCV: 99 fL — ABNORMAL HIGH (ref 79–97)
Platelets: 199 10*3/uL (ref 150–450)
RBC: 3.88 x10E6/uL (ref 3.77–5.28)
RDW: 12.8 % (ref 11.7–15.4)
WBC: 4.5 10*3/uL (ref 3.4–10.8)

## 2024-02-18 LAB — TSH+T4F+T3FREE
Free T4: 1.18 ng/dL (ref 0.82–1.77)
T3, Free: 2.9 pg/mL (ref 2.0–4.4)
TSH: 4.06 u[IU]/mL (ref 0.450–4.500)

## 2024-02-18 LAB — LIPID PANEL
Chol/HDL Ratio: 2.9 ratio (ref 0.0–4.4)
Cholesterol, Total: 190 mg/dL (ref 100–199)
HDL: 65 mg/dL (ref >39)
LDL Chol Calc (NIH): 112 mg/dL — ABNORMAL HIGH (ref 0–99)
Triglycerides: 72 mg/dL (ref 0–149)
VLDL Cholesterol Cal: 13 mg/dL (ref 5–40)

## 2024-02-25 ENCOUNTER — Telehealth: Payer: Self-pay

## 2024-02-25 DIAGNOSIS — R7303 Prediabetes: Secondary | ICD-10-CM

## 2024-02-25 DIAGNOSIS — E7800 Pure hypercholesterolemia, unspecified: Secondary | ICD-10-CM

## 2024-02-25 NOTE — Telephone Encounter (Signed)
 Called and was able to inform the pt of her lab results per Dr R she verbally stated she understood but wanted to know if there is any advise on how to lower the LDL  and A1c

## 2024-03-01 NOTE — Telephone Encounter (Signed)
 Patient device continued to ring with no voicemail pick up. Please advise if call is returned

## 2024-03-04 NOTE — Telephone Encounter (Signed)
 Patient was unable to answer her phone. I did inform the patient of the notes regarding the MCV. She is fully aware and has no further questions. She will be in tomorrow to pick up her printouts. Also advised for her to get her labs collected 2 weeks before her Nov appt.

## 2024-03-04 NOTE — Addendum Note (Signed)
 Addended by: SIMMONS-ROBINSON, Indie Boehne L on: 03/04/2024 12:46 PM   Modules accepted: Orders

## 2024-03-04 NOTE — Telephone Encounter (Signed)
 MCV is referring to size of the red blood cells, her cells are slightly larger than normal range. This is not far from her baseline value and in absence of symptoms of illness, this does not require additional intervention at this time   Lipid panel, A1c ordered to be collected 2 weeks prior to follow up appt scheduled in Nov

## 2024-03-04 NOTE — Telephone Encounter (Signed)
 Pt advised. Verbalized understanding and would like some information to help her with her diet that she can pick up. Advised I will place printouts at the front desk for her to pick up. She verbalized understanding.   Pt would also like to know what does it mean if her MCV is at 99 and if she should make any changes in regards to that? As well as she would like to know if order can be placed for her to get updated labs before her next appointment since she was advised that she should be fasting next time.   Please advise

## 2024-03-29 DIAGNOSIS — D2271 Melanocytic nevi of right lower limb, including hip: Secondary | ICD-10-CM | POA: Diagnosis not present

## 2024-03-29 DIAGNOSIS — D225 Melanocytic nevi of trunk: Secondary | ICD-10-CM | POA: Diagnosis not present

## 2024-03-29 DIAGNOSIS — L720 Epidermal cyst: Secondary | ICD-10-CM | POA: Diagnosis not present

## 2024-03-29 DIAGNOSIS — L570 Actinic keratosis: Secondary | ICD-10-CM | POA: Diagnosis not present

## 2024-03-29 DIAGNOSIS — Z85828 Personal history of other malignant neoplasm of skin: Secondary | ICD-10-CM | POA: Diagnosis not present

## 2024-03-29 DIAGNOSIS — D2262 Melanocytic nevi of left upper limb, including shoulder: Secondary | ICD-10-CM | POA: Diagnosis not present

## 2024-03-29 DIAGNOSIS — D2261 Melanocytic nevi of right upper limb, including shoulder: Secondary | ICD-10-CM | POA: Diagnosis not present

## 2024-04-17 ENCOUNTER — Other Ambulatory Visit: Payer: Self-pay | Admitting: Family Medicine

## 2024-04-17 DIAGNOSIS — I10 Essential (primary) hypertension: Secondary | ICD-10-CM

## 2024-04-23 ENCOUNTER — Other Ambulatory Visit: Payer: Self-pay | Admitting: Family Medicine

## 2024-06-30 DIAGNOSIS — H18593 Other hereditary corneal dystrophies, bilateral: Secondary | ICD-10-CM | POA: Diagnosis not present

## 2024-06-30 DIAGNOSIS — M3501 Sicca syndrome with keratoconjunctivitis: Secondary | ICD-10-CM | POA: Diagnosis not present

## 2024-06-30 DIAGNOSIS — H43813 Vitreous degeneration, bilateral: Secondary | ICD-10-CM | POA: Diagnosis not present

## 2024-06-30 DIAGNOSIS — Z961 Presence of intraocular lens: Secondary | ICD-10-CM | POA: Diagnosis not present

## 2024-07-15 NOTE — Progress Notes (Signed)
 Morgan Ponce                                          MRN: [id number]   07/15/2024   The [facility] Quality Team Specialist reviewed this patient medical record for the purposes of chart review for care gap closure. The following were reviewed: chart review for care gap closure-kidney health evaluation for diabetes:eGFR  and uACR.    [facility] Quality Team

## 2024-07-19 ENCOUNTER — Telehealth: Payer: Self-pay

## 2024-07-30 NOTE — Telephone Encounter (Signed)
 Entered in error

## 2024-08-04 DIAGNOSIS — E7800 Pure hypercholesterolemia, unspecified: Secondary | ICD-10-CM | POA: Diagnosis not present

## 2024-08-04 DIAGNOSIS — R7303 Prediabetes: Secondary | ICD-10-CM | POA: Diagnosis not present

## 2024-08-05 ENCOUNTER — Ambulatory Visit: Payer: Self-pay | Admitting: Family Medicine

## 2024-08-05 LAB — LIPID PANEL
Chol/HDL Ratio: 2.5 ratio (ref 0.0–4.4)
Cholesterol, Total: 178 mg/dL (ref 100–199)
HDL: 71 mg/dL (ref >39)
LDL Chol Calc (NIH): 95 mg/dL (ref 0–99)
Triglycerides: 62 mg/dL (ref 0–149)
VLDL Cholesterol Cal: 12 mg/dL (ref 5–40)

## 2024-08-05 LAB — HEMOGLOBIN A1C
Est. average glucose Bld gHb Est-mCnc: 114 mg/dL
Hgb A1c MFr Bld: 5.6 % (ref 4.8–5.6)

## 2024-08-13 NOTE — Progress Notes (Signed)
 Morgan Ponce                                          MRN: [id number]   08/13/2024   The [facility] Quality Team Specialist reviewed this patient medical record for the purposes of chart review for care gap closure. The following were reviewed: chart review for care gap closure-controlling blood pressure.    [facility] Quality Team

## 2024-08-18 ENCOUNTER — Encounter: Payer: Self-pay | Admitting: Family Medicine

## 2024-08-18 ENCOUNTER — Ambulatory Visit: Admitting: Family Medicine

## 2024-08-18 VITALS — BP 142/70 | HR 78 | Ht 63.0 in | Wt 94.6 lb

## 2024-08-18 DIAGNOSIS — Z681 Body mass index (BMI) 19 or less, adult: Secondary | ICD-10-CM

## 2024-08-18 DIAGNOSIS — M818 Other osteoporosis without current pathological fracture: Secondary | ICD-10-CM

## 2024-08-18 DIAGNOSIS — I4891 Unspecified atrial fibrillation: Secondary | ICD-10-CM | POA: Diagnosis not present

## 2024-08-18 DIAGNOSIS — R636 Underweight: Secondary | ICD-10-CM

## 2024-08-18 DIAGNOSIS — L700 Acne vulgaris: Secondary | ICD-10-CM | POA: Diagnosis not present

## 2024-08-18 DIAGNOSIS — I4892 Unspecified atrial flutter: Secondary | ICD-10-CM

## 2024-08-18 DIAGNOSIS — R634 Abnormal weight loss: Secondary | ICD-10-CM | POA: Diagnosis not present

## 2024-08-18 DIAGNOSIS — I10 Essential (primary) hypertension: Secondary | ICD-10-CM | POA: Diagnosis not present

## 2024-08-18 DIAGNOSIS — H9190 Unspecified hearing loss, unspecified ear: Secondary | ICD-10-CM

## 2024-08-18 DIAGNOSIS — E7800 Pure hypercholesterolemia, unspecified: Secondary | ICD-10-CM

## 2024-08-18 MED ORDER — DOXYCYCLINE HYCLATE 100 MG PO TABS: 100.0000 mg | ORAL_TABLET | Freq: Every day | ORAL | 1 refills | Status: DC

## 2024-08-18 NOTE — Patient Instructions (Addendum)
  VISIT SUMMARY: Today, we reviewed your chronic conditions, including hypertension, osteoporosis, and cholesterol levels. We also discussed your recent weight loss, facial bumps, and hearing aids. Your blood pressure and cholesterol levels have improved, and your prediabetes has resolved. We made some adjustments to your treatment plan to address your weight and facial bumps.  YOUR PLAN: CHRONIC HYPERTENSION: Your blood pressure has improved with your current medications. -Continue taking hydralazine  10 mg twice daily. -Continue taking losartan  50 mg twice daily.  ATRIAL FIBRILLATION AND ATRIAL FLUTTER: This is a chronic condition managed with aspirin. -Continue taking aspirin 81 mg daily.  OSTEOPOROSIS: You are receiving Reclast  IV infusions and participating in physical activities. -Continue Reclast  IV infusions as scheduled. -Keep engaging in physical activities like walking and Silver [employer].  PURE HYPERCHOLESTEROLEMIA: Your cholesterol levels have improved with lifestyle changes. -Continue with your current lifestyle modifications to manage cholesterol.  HISTORY OF PREDIABETES, NOW RESOLVED: Your prediabetes has resolved with lifestyle changes. -Continue with your current lifestyle modifications.  UNDERWEIGHT AND ABNORMAL WEIGHT LOSS: Your BMI is currently 16, and you have lost weight due to dietary changes. -Increase your protein intake. -Consider protein supplements like Premier Protein. -Monitor your weight and nutritional intake.  FACIAL COMEDONAL ACNE: You have bumps on your face that resemble comedones. -Start taking doxycycline  100 mg daily for 8 weeks.  HEARING LOSS: You are managing your hearing loss with hearing aids. -Continue using your hearing aids.                      Contains text generated by [employer].                                 Contains text generated by [employer].

## 2024-08-18 NOTE — Progress Notes (Signed)
 "     Established patient visit   Patient: Morgan Ponce   DOB: 1941-06-11   83 y.o. Female  MRN: [id number] Visit Date: 08/18/2024  Today's healthcare provider: Rockie Agent, MD   Chief Complaint  Patient presents with   Medical Management of Chronic Issues    Patient is present for f/u BP, cholesterol. Doing well overall    Subjective     HPI     Medical Management of Chronic Issues    Additional comments: Patient is present for f/u BP, cholesterol. Doing well overall       Last edited by Cherry Chiquita HERO, CMA on 08/18/2024  2:30 PM.       Discussed the use of AI scribe software for clinical note transcription with the patient, who gave verbal consent to proceed.  History of Present Illness Morgan Ponce is an 83 year old female with chronic hypertension and osteoporosis who presents for a follow-up visit.  Her blood pressure was initially measured at 162/86 and later measured at 142/70. She continues to take hydralazine  10 mg twice daily and losartan  50 mg twice daily for her chronic hypertension. No issues with her blood pressure medications and no refills are required at this time.  She is receiving infusions for her osteoporosis and has had one infusion without any noticeable side effects. She has joined a [employer] and participates in activities such as walking on a stationary bicycle and walking outside with a friend when the weather permits.  Her cholesterol levels have improved, with her LDL cholesterol decreasing from 112 to 95. She has hypercholesterolemia and is not currently taking medication for it.  She was previously noted to be in the prediabetes range with an A1c of 5.7 in May, which has since decreased to 5.6, placing her in the normal range. She has made dietary changes, including reducing her intake of sweets, which she believes has contributed to this improvement.  She reports weight loss, which she  attributes to dietary changes, and her BMI is currently 16. She maintains a diet of greens, salads, and whole wheat bread.  She inquires about treatment for bumps on her face, which she describes as not acne-like. A dermatologist advised lancing them, but she is unable to do so herself.  She mentions a past issue with her medical record indicating a diagnosis of HIV, which she disputes, clarifying that it was a misunderstanding related to her hearing aids. She began using hearing aids in March 2022.     Past Medical History:  Diagnosis Date   Arthritis    Dysrhythmia    HOH (hard of hearing)    wears bilateral hearing aids   Hyperlipidemia    Hypertension    RESTARTED BP MED SINCE FIRST CATARACT   Pain    RIGHT KNEE   Skin cancer of forehead     Medications: Outpatient Medications Prior to Visit  Medication Sig   aspirin 81 MG tablet Take 81 mg at bedtime by mouth.    ferrous sulfate 324 MG TBEC Take 324 mg by mouth daily with breakfast. Taking every other day.   hydrALAZINE  (APRESOLINE ) 10 MG tablet TAKE 1 TABLET BY MOUTH TWICE A DAY   losartan  (COZAAR ) 50 MG tablet TAKE 1 TABLET (50 MG) BY MOUTH IN THE MORNING AND AT BEDTIME   Polyvinyl Alcohol-Povidone (REFRESH OP) Place 1 drop 5 (five) times daily as needed into both eyes (DRY EYES).   Vitamin D , Cholecalciferol,  1000 UNITS TABS Take 1 tablet by mouth daily.   Zoledronic  Acid (RECLAST  IV) Inject into the vein.   [DISCONTINUED] COMIRNATY syringe    [DISCONTINUED] SHINGRIX injection  (Patient not taking: Reported on 02/16/2024)   No facility-administered medications prior to visit.    Review of Systems      Objective    BP (!) 142/70 (BP Location: Left Arm, Patient Position: Sitting, Cuff Size: Normal) Comment: manual  Pulse 78   Ht 5' 3 (1.6 m)   Wt 94 lb 9.6 oz (42.9 kg)   SpO2 98%   BMI 16.76 kg/m      Physical Exam  Physical Exam VITALS: BP- 142/70 MEASUREMENTS: BMI- 16.0. NECK: Thyroid  without  nodules or enlargement. CHEST: Clear to auscultation bilaterally. CARDIOVASCULAR: Regular rate and rhythm.    No results found for any visits on 08/18/24.  Assessment & Plan     Problem List Items Addressed This Visit     Flutter-fibrillation (HCC)   HOH (hard of hearing) - Primary   Hypercholesteremia   Hypertension   OP (osteoporosis)   Other Visit Diagnoses       Closed comedone       Relevant Medications   doxycycline  (VIBRA -TABS) 100 MG tablet     Unintentional weight loss         Underweight (BMI < 18.5)           Assessment and Plan Assessment & Plan Chronic hypertension Blood pressure initially 162/86, improved to 142/70. Current regimen includes hydralazine  10 mg twice daily and losartan  50 mg twice daily. No issues with home blood pressure monitoring or medication adherence. - Continue hydralazine  10 mg twice daily - Continue losartan  50 mg twice daily  Atrial fibrillation and atrial flutter Chronic condition managed with aspirin 81 mg daily. - Continue aspirin 81 mg daily  Osteoporosis without current pathological fracture Chronic condition managed with Reclast  IV infusions. No adverse effects reported from infusions. Engages in physical activity such as walking and [employer] program. - Continue Reclast  IV infusions as scheduled - Encouraged continued physical activity  Pure hypercholesterolemia Chronic condition managed with lifestyle modifications. Recent LDL cholesterol decreased from 112 to 95, indicating improvement. - Continue lifestyle modifications for cholesterol management  History of prediabetes, now resolved Previous prediabetes resolved with lifestyle changes. Recent A1c decreased from 5.7 to 5.6, indicating normal range. - Continue current lifestyle modifications  Underweight and abnormal weight loss BMI decreased to 16. Weight loss attributed to dietary changes, including reduced intake of sweets and increased consumption of greens  and whole wheat bread. Risk of being underweight discussed, emphasizing the importance of maintaining adequate weight for bone health and infection prevention. - Encouraged increased protein intake - Consider protein supplements such as Premier Protein - Monitor weight and nutritional intake  Facial comedonal acne Presence of facial bumps resembling comedones. Previous use of triamcinolone  cream was ineffective. Doxycycline  considered as a treatment option. - Prescribed doxycycline  100 mg daily for 8 weeks  Hearing loss, uses hearing aids Hearing loss managed with hearing aids. No new issues reported. - Continue use of hearing aids  Total time spent on today's visit was 50 minutes, including both face-to-face time interviewing and examining the patient, reviewing medical record including labs/imaging/specialist notes from 2021-2023, developing and discussing further evaluation,answering patient's questions,  coordinating follow up care in addition to documenting in the patient's chart.     Return in 26 weeks (on 02/16/2025) for AWV, CPE.  Rockie Agent, MD  [facility] [phone] (phone) [phone] (fax)  [facility] "

## 2024-09-10 NOTE — Progress Notes (Signed)
 Morgan Ponce                                          MRN: [id number]   09/10/2024   The [facility] Quality Team Specialist reviewed this patient medical record for the purposes of chart review for care gap closure. The following were reviewed: chart review for care gap closure-controlling blood pressure.    [facility] Quality Team

## 2024-10-09 ENCOUNTER — Other Ambulatory Visit: Payer: Self-pay | Admitting: Family Medicine

## 2024-10-09 DIAGNOSIS — I10 Essential (primary) hypertension: Secondary | ICD-10-CM

## 2024-10-12 ENCOUNTER — Other Ambulatory Visit: Payer: Self-pay | Admitting: Family Medicine

## 2024-10-12 DIAGNOSIS — L700 Acne vulgaris: Secondary | ICD-10-CM

## 2024-10-17 ENCOUNTER — Other Ambulatory Visit: Payer: Self-pay | Admitting: Family Medicine

## 2024-10-20 NOTE — Progress Notes (Signed)
 Morgan Ponce                                          MRN: [id number]   10/20/2024   The [facility] Quality Team Specialist reviewed this patient medical record for the purposes of chart review for care gap closure. The following were reviewed: chart review for care gap closure-controlling blood pressure.    [facility] Quality Team

## 2024-10-22 ENCOUNTER — Telehealth: Payer: Self-pay | Admitting: Family Medicine

## 2024-10-22 NOTE — Telephone Encounter (Signed)
 Please let patient know that she should stop taking the doxycycline  and schedule an earlier appointment with Dr. Lang to discuss.

## 2024-10-22 NOTE — Telephone Encounter (Signed)
 Copied from CRM #[id number]. Topic: Clinical - Prescription Issue >> Oct 22, 2024  2:04 PM Morgan Ponce wrote: Reason for CRM: Patient who is taking doxycycline  (VIBRA -TABS) 100 MG tablet, and stats she doesn't have a upcoming visit until June . Patient request a call to discuss if she should keep taking the medication or not. Please return call to pt. Leave  message on machine as well.

## 2024-11-10 ENCOUNTER — Ambulatory Visit: Admitting: Family Medicine

## 2025-02-16 ENCOUNTER — Encounter: Admitting: Family Medicine

## 2025-02-22 ENCOUNTER — Encounter: Admitting: Family Medicine

## 2025-02-28 ENCOUNTER — Encounter: Admitting: Family Medicine
